# Patient Record
Sex: Female | Born: 1940 | Race: White | Hispanic: No | Marital: Married | Smoking: Former smoker
Health system: Southern US, Community
[De-identification: ages and names within clinical notes are randomized; demographics above are authoritative.]

## PROBLEM LIST (undated history)

## (undated) DIAGNOSIS — F32A Depression, unspecified: Secondary | ICD-10-CM

## (undated) DIAGNOSIS — E785 Hyperlipidemia, unspecified: Secondary | ICD-10-CM

## (undated) DIAGNOSIS — I456 Pre-excitation syndrome: Secondary | ICD-10-CM

## (undated) DIAGNOSIS — R0989 Other specified symptoms and signs involving the circulatory and respiratory systems: Secondary | ICD-10-CM

## (undated) DIAGNOSIS — C50919 Malignant neoplasm of unspecified site of unspecified female breast: Secondary | ICD-10-CM

## (undated) DIAGNOSIS — I2510 Atherosclerotic heart disease of native coronary artery without angina pectoris: Secondary | ICD-10-CM

## (undated) DIAGNOSIS — F329 Major depressive disorder, single episode, unspecified: Secondary | ICD-10-CM

## (undated) DIAGNOSIS — I7300 Raynaud's syndrome without gangrene: Secondary | ICD-10-CM

## (undated) DIAGNOSIS — I340 Nonrheumatic mitral (valve) insufficiency: Secondary | ICD-10-CM

## (undated) DIAGNOSIS — K219 Gastro-esophageal reflux disease without esophagitis: Secondary | ICD-10-CM

## (undated) DIAGNOSIS — Z923 Personal history of irradiation: Secondary | ICD-10-CM

## (undated) DIAGNOSIS — M109 Gout, unspecified: Secondary | ICD-10-CM

## (undated) DIAGNOSIS — Z972 Presence of dental prosthetic device (complete) (partial): Secondary | ICD-10-CM

## (undated) DIAGNOSIS — G893 Neoplasm related pain (acute) (chronic): Secondary | ICD-10-CM

## (undated) DIAGNOSIS — C7951 Secondary malignant neoplasm of bone: Secondary | ICD-10-CM

## (undated) DIAGNOSIS — I4891 Unspecified atrial fibrillation: Secondary | ICD-10-CM

## (undated) HISTORY — PX: ABDOMINAL HYSTERECTOMY: SHX81

## (undated) HISTORY — PX: BREAST LUMPECTOMY: SHX2

## (undated) HISTORY — DX: Neoplasm related pain (acute) (chronic): G89.3

## (undated) HISTORY — DX: Secondary malignant neoplasm of bone: C79.51

## (undated) HISTORY — PX: CLOSED REDUCTION CLAVICLE FRACTURE: SUR253

---

## 1978-04-12 HISTORY — PX: BREAST EXCISIONAL BIOPSY: SUR124

## 2000-04-12 DIAGNOSIS — C50919 Malignant neoplasm of unspecified site of unspecified female breast: Secondary | ICD-10-CM

## 2000-04-12 HISTORY — DX: Malignant neoplasm of unspecified site of unspecified female breast: C50.919

## 2000-04-12 HISTORY — PX: BREAST EXCISIONAL BIOPSY: SUR124

## 2004-02-27 ENCOUNTER — Ambulatory Visit: Payer: Self-pay | Admitting: Oncology

## 2004-06-29 ENCOUNTER — Ambulatory Visit: Payer: Self-pay | Admitting: Internal Medicine

## 2004-09-17 ENCOUNTER — Ambulatory Visit: Payer: Self-pay | Admitting: Oncology

## 2005-03-18 ENCOUNTER — Ambulatory Visit: Payer: Self-pay | Admitting: Oncology

## 2005-03-24 ENCOUNTER — Ambulatory Visit: Payer: Self-pay | Admitting: Unknown Physician Specialty

## 2005-08-03 ENCOUNTER — Ambulatory Visit: Payer: Self-pay | Admitting: Internal Medicine

## 2005-09-14 ENCOUNTER — Ambulatory Visit: Payer: Self-pay | Admitting: Oncology

## 2005-11-25 ENCOUNTER — Ambulatory Visit: Payer: Self-pay | Admitting: Oncology

## 2005-11-26 ENCOUNTER — Ambulatory Visit: Payer: Self-pay | Admitting: Internal Medicine

## 2005-11-26 IMAGING — US ABDOMEN ULTRASOUND
1 series · 17 of 25 positions shown · non-contrast
Comparison: none

REASON FOR EXAM: elevated LFT's
COMMENTS:

[Series 1: abdomen ultrasound · 17 of 57 slices shown]
[im 1/57]
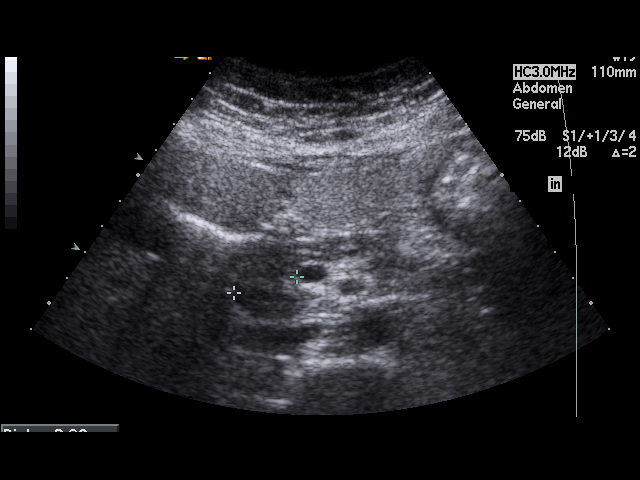
[im 5/57]
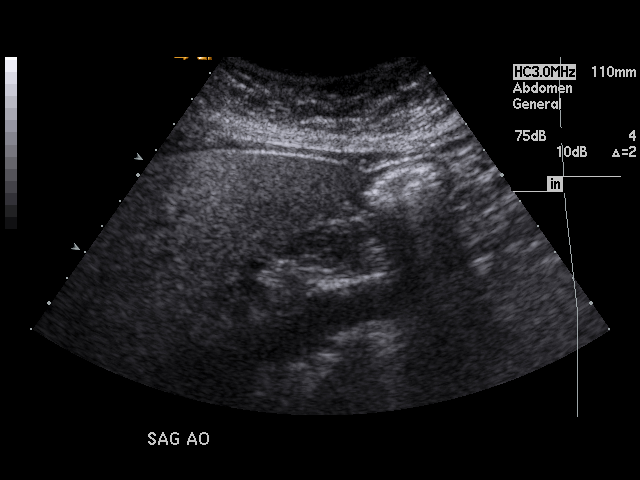
[im 8/57]
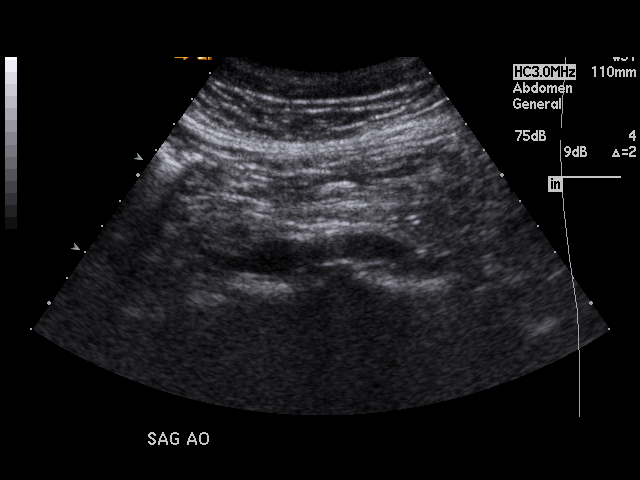
[im 12/57]
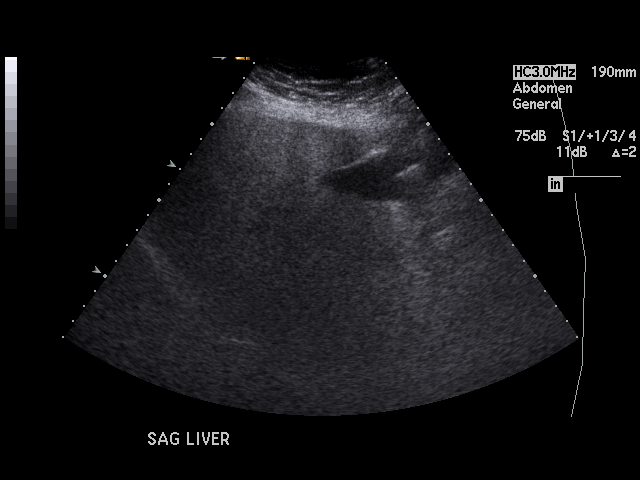
[im 15/57]
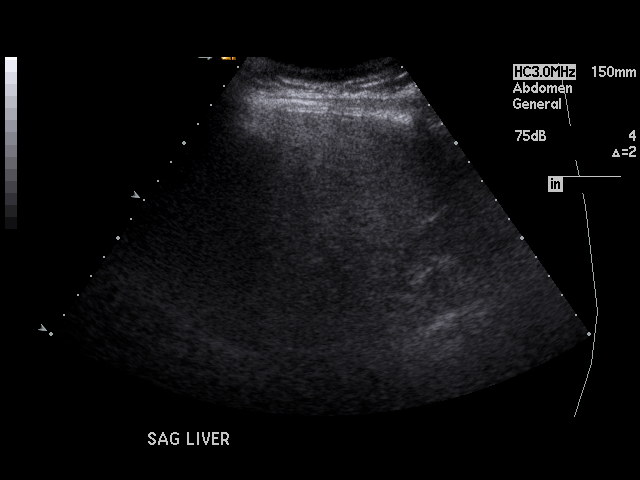
[im 19/57]
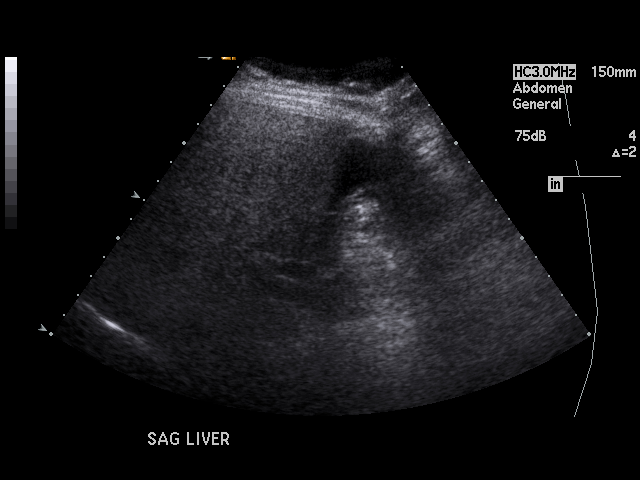
[im 22/57]
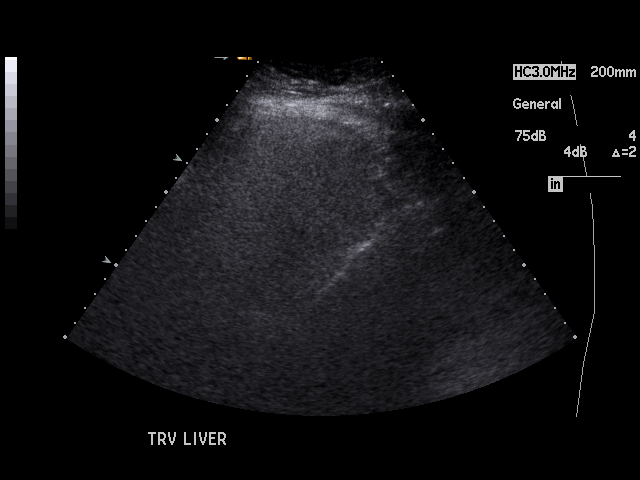
[im 26/57]
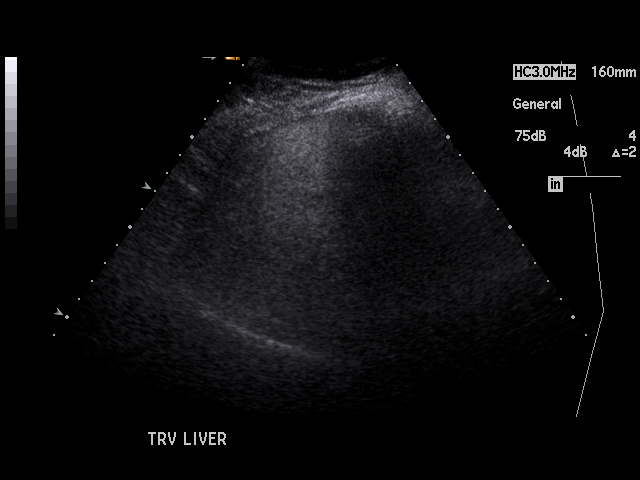
[im 29/57]
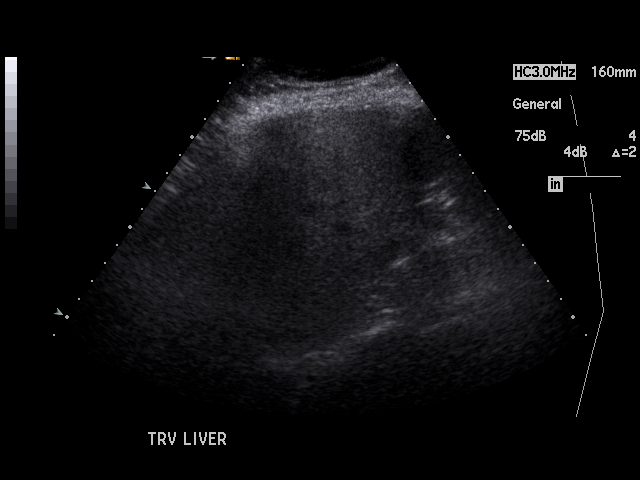
[im 31/57]
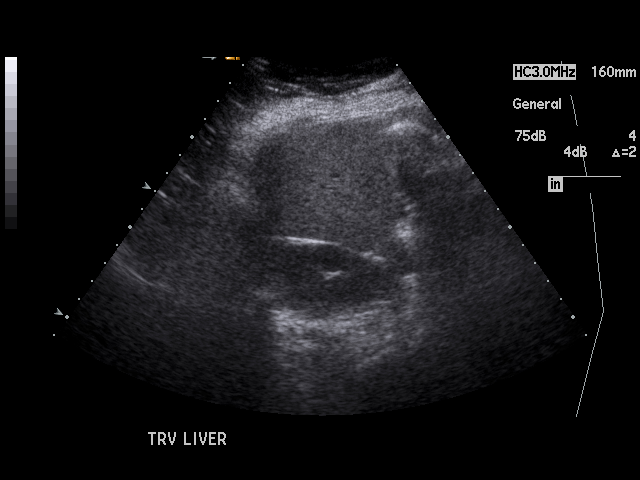
[im 36/57]
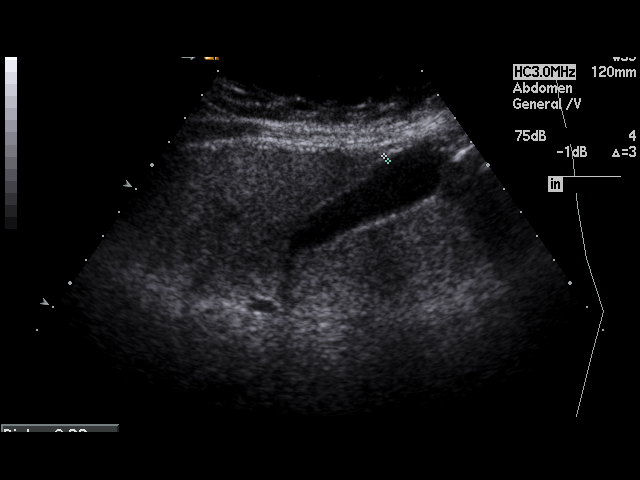
[im 38/57]
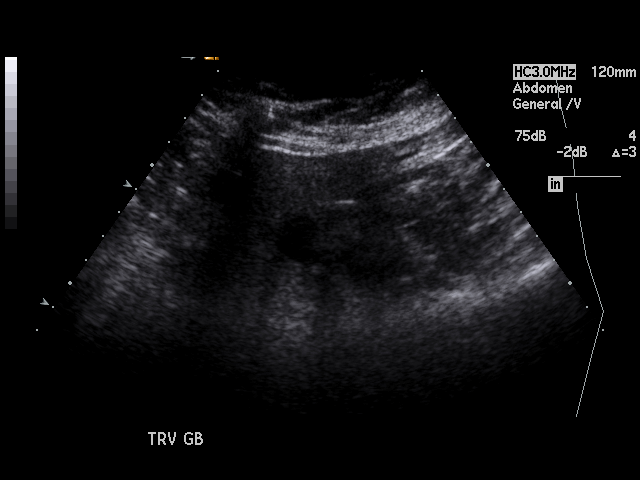
[im 43/57]
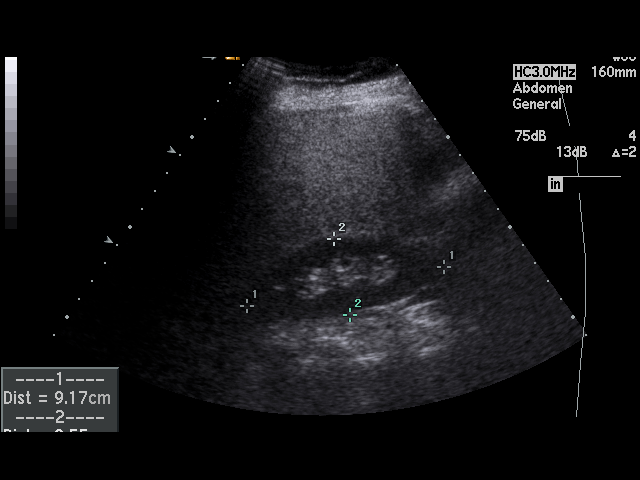
[im 45/57]
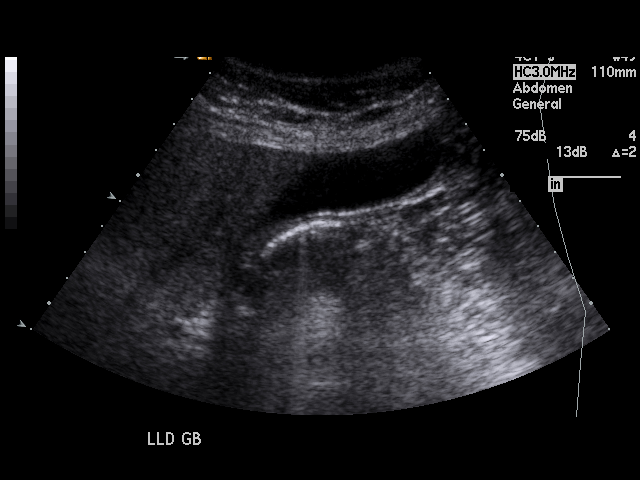
[im 50/57]
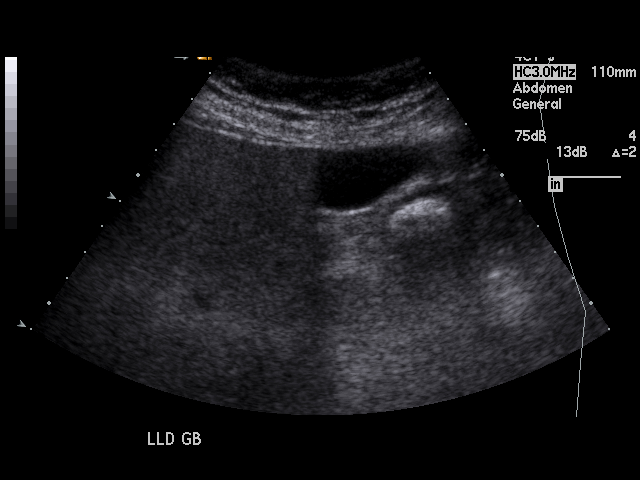
[im 52/57]
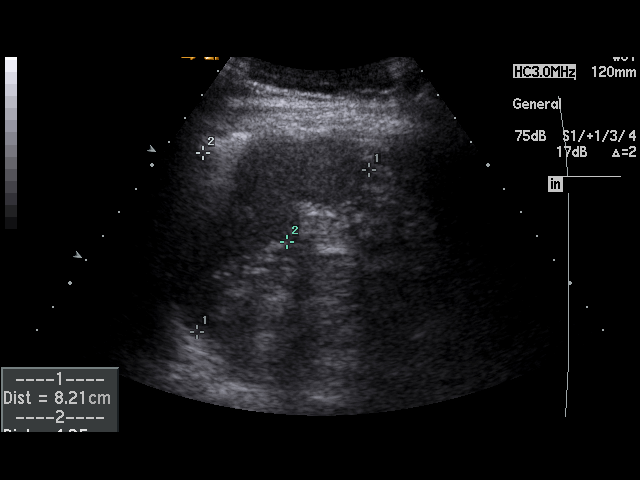
[im 57/57]
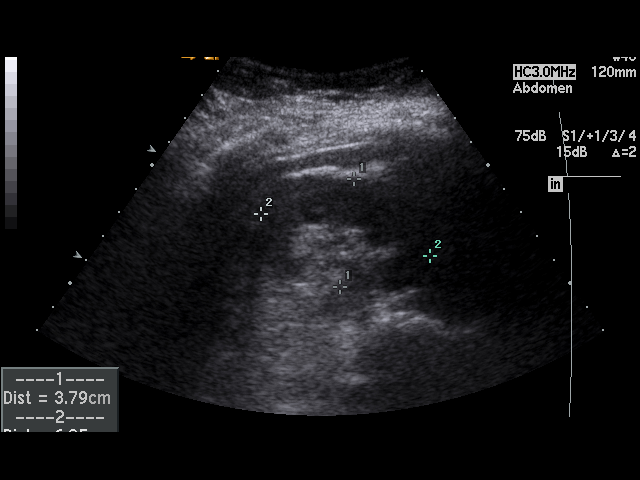

[17 of 25 positions shown; findings below may reference images not displayed]

PROCEDURE:     US  - US ABDOMEN GENERAL SURVEY  - [DATE] [DATE]

RESULT:     The liver is dense, suspicious for fatty infiltration. No focal
hepatic mass lesions are seen. The spleen is normal in size.  The pancreas
is visualized and shows no significant abnormalities. The abdominal aorta is
normal in appearance. No gallstones are seen. There is no thickening of the
gallbladder wall. The common bile duct measures 4.6 mm in diameter, which is
within normal limits. The kidneys show no hydronephrosis. There is no
ascites.
IMPRESSION: 1)Possible fatty infiltration of the liver.

2)Otherwise normal study.

## 2006-04-18 ENCOUNTER — Ambulatory Visit: Payer: Self-pay | Admitting: Oncology

## 2006-09-23 ENCOUNTER — Ambulatory Visit: Payer: Self-pay | Admitting: Internal Medicine

## 2006-10-11 ENCOUNTER — Ambulatory Visit: Payer: Self-pay | Admitting: Oncology

## 2006-10-17 ENCOUNTER — Ambulatory Visit: Payer: Self-pay | Admitting: Oncology

## 2006-11-11 ENCOUNTER — Ambulatory Visit: Payer: Self-pay | Admitting: Oncology

## 2007-04-13 ENCOUNTER — Ambulatory Visit: Payer: Self-pay | Admitting: Oncology

## 2007-04-26 ENCOUNTER — Ambulatory Visit: Payer: Self-pay | Admitting: Oncology

## 2007-05-14 ENCOUNTER — Ambulatory Visit: Payer: Self-pay | Admitting: Oncology

## 2007-10-02 ENCOUNTER — Ambulatory Visit: Payer: Self-pay | Admitting: Internal Medicine

## 2007-10-11 ENCOUNTER — Ambulatory Visit: Payer: Self-pay | Admitting: Oncology

## 2007-11-03 ENCOUNTER — Ambulatory Visit: Payer: Self-pay | Admitting: Oncology

## 2007-11-11 ENCOUNTER — Ambulatory Visit: Payer: Self-pay | Admitting: Oncology

## 2008-10-02 ENCOUNTER — Ambulatory Visit: Payer: Self-pay | Admitting: Internal Medicine

## 2008-10-10 ENCOUNTER — Ambulatory Visit: Payer: Self-pay | Admitting: Oncology

## 2008-10-31 ENCOUNTER — Ambulatory Visit: Payer: Self-pay | Admitting: Oncology

## 2008-11-10 ENCOUNTER — Ambulatory Visit: Payer: Self-pay | Admitting: Oncology

## 2009-10-08 ENCOUNTER — Ambulatory Visit: Payer: Self-pay | Admitting: Internal Medicine

## 2009-10-10 ENCOUNTER — Ambulatory Visit: Payer: Self-pay | Admitting: Internal Medicine

## 2009-10-10 ENCOUNTER — Ambulatory Visit: Payer: Self-pay | Admitting: Oncology

## 2009-10-27 ENCOUNTER — Ambulatory Visit: Payer: Self-pay | Admitting: Oncology

## 2009-11-10 ENCOUNTER — Ambulatory Visit: Payer: Self-pay | Admitting: Oncology

## 2009-12-08 ENCOUNTER — Ambulatory Visit: Payer: Self-pay | Admitting: Ophthalmology

## 2009-12-16 ENCOUNTER — Ambulatory Visit: Payer: Self-pay | Admitting: Ophthalmology

## 2010-01-13 ENCOUNTER — Ambulatory Visit: Payer: Self-pay | Admitting: Ophthalmology

## 2010-02-03 ENCOUNTER — Ambulatory Visit: Payer: Self-pay | Admitting: Ophthalmology

## 2010-10-13 ENCOUNTER — Ambulatory Visit: Payer: Self-pay | Admitting: Internal Medicine

## 2010-10-13 IMAGING — MG MAM DGTL DIAGNOSTIC MAMMO W/CAD
1 series · 6 of 6 positions shown · non-contrast
Comparison: none

REASON FOR EXAM: hx ca  yrly
COMMENTS:

[Series 1367: R CC · right · 6 of 6 slices shown]
[im 1/6]
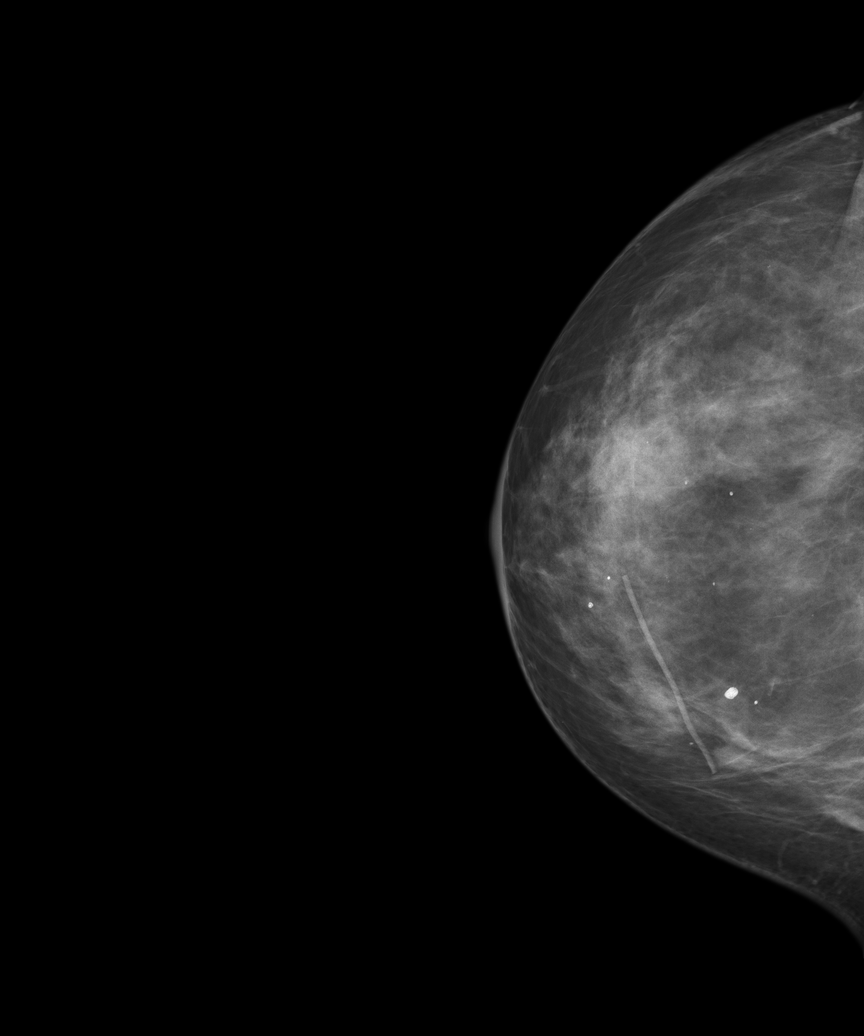
[im 2/6]
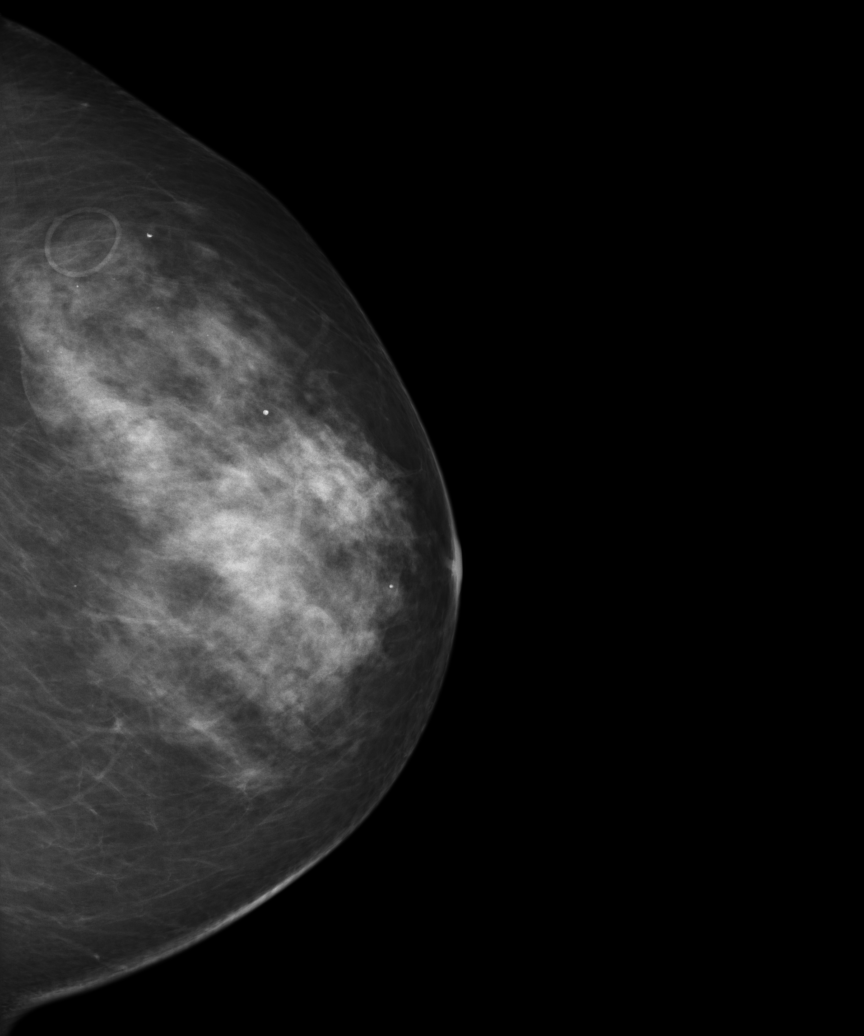
[im 3/6]
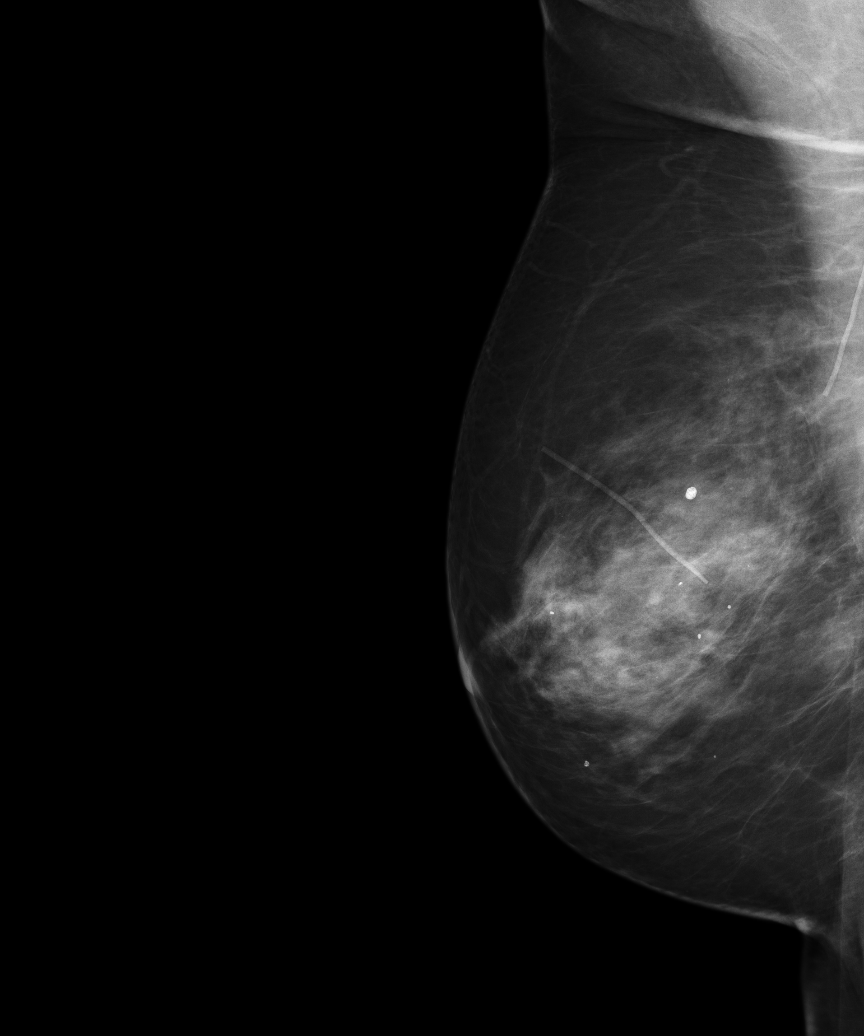
[im 4/6]
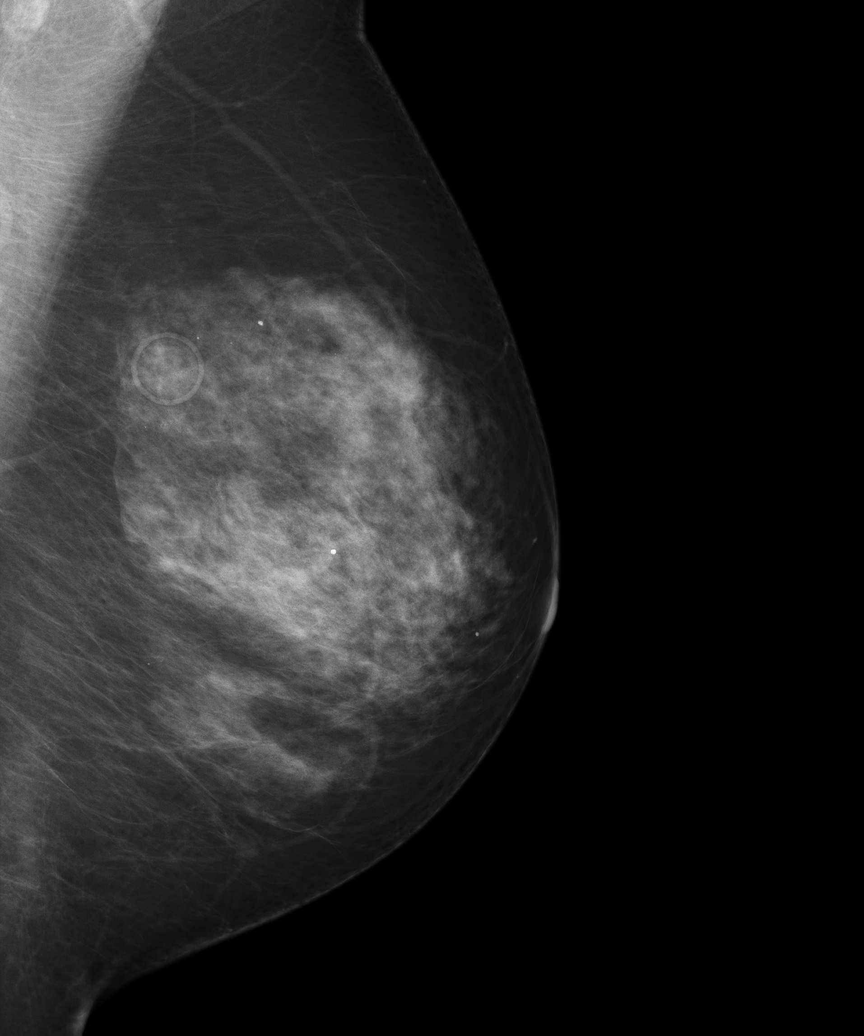
[im 5/6]
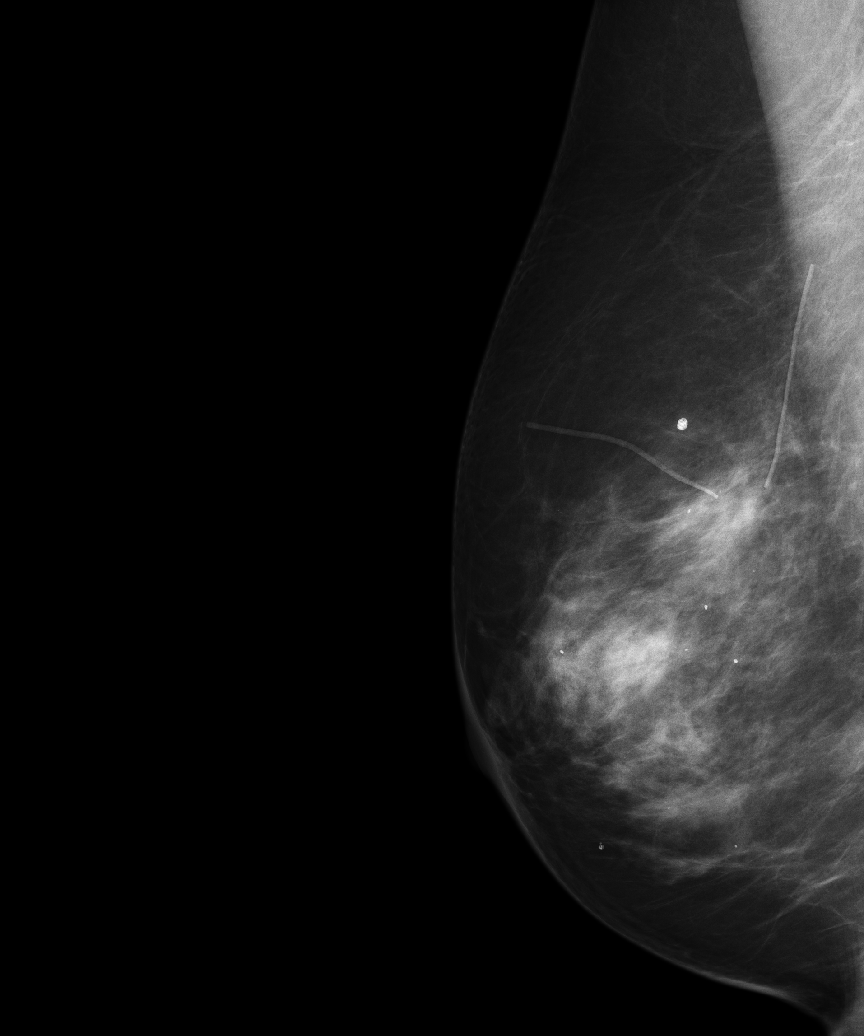
[im 6/6]
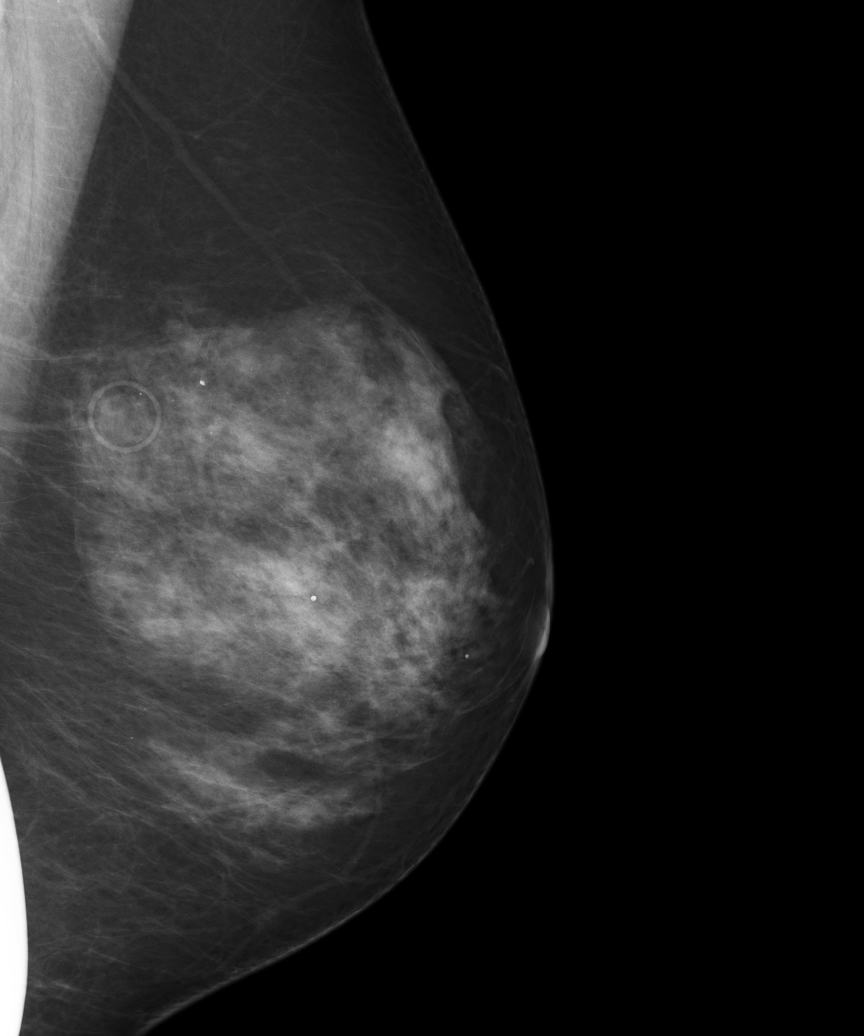

[6 of 6 positions shown; findings below may reference images not displayed]

PROCEDURE:     MAM - MAM DGTL DIAGNOSTIC MAMMO W/CAD  - [DATE]  [DATE]

RESULT:     The patient has a history of right breast lumpectomy with a
diagnosis of cancer in [KD] followed by radiation therapy. Comparison is
made to the previous digital images of [DATE], as well as [DATE]. The
breasts exhibit a dense heterogeneous parenchymal pattern with scattered
coarse and stable, benign calcifications present. A skin lesion is marked
laterally in the posterior left breast. There does not appear to be evidence
of an area of developing parenchymal density or dominant mass. A surgical
scar marker is seen in the upper medial aspect of the right breast midway
between the nipple and pectoralis muscle and in the upper outer posterior
right breast near the axillary region.
IMPRESSION: 1.Stable, benign appearing bilateral mammogram.

BI-RADS: Category 2 - Benign Findings

RECOMMENDATIONS:

1.     Please continue to encourage yearly mammographic follow-up.

A NEGATIVE MAMMOGRAM REPORT DOES NOT PRECLUDE BIOPSY OR OTHER EVALUATION OF
A CLINICALLY PALPABLE OR OTHERWISE SUSPICIOUS MASS OR LESION. BREAST CANCER
MAY NOT BE DETECTED BY MAMMOGRAPHY IN UP TO 10% OF CASES.

## 2010-12-22 ENCOUNTER — Ambulatory Visit: Payer: Self-pay | Admitting: Oncology

## 2011-01-11 ENCOUNTER — Ambulatory Visit: Payer: Self-pay | Admitting: Oncology

## 2011-11-04 ENCOUNTER — Ambulatory Visit: Payer: Self-pay | Admitting: Internal Medicine

## 2011-11-04 IMAGING — MG MM CAD DIAGNOSTIC MAMMO
1 series · 6 of 6 positions shown · non-contrast
Comparison: none

REASON FOR EXAM: HX BRST CA YRLY
COMMENTS:

PROCEDURE:     MAM - MAM DGTL DIAGNOSTIC MAMMO W/CAD  - [DATE]  [DATE]
RESULT:

[R CC · right · 6 of 6 slices shown]
[im 1/6]
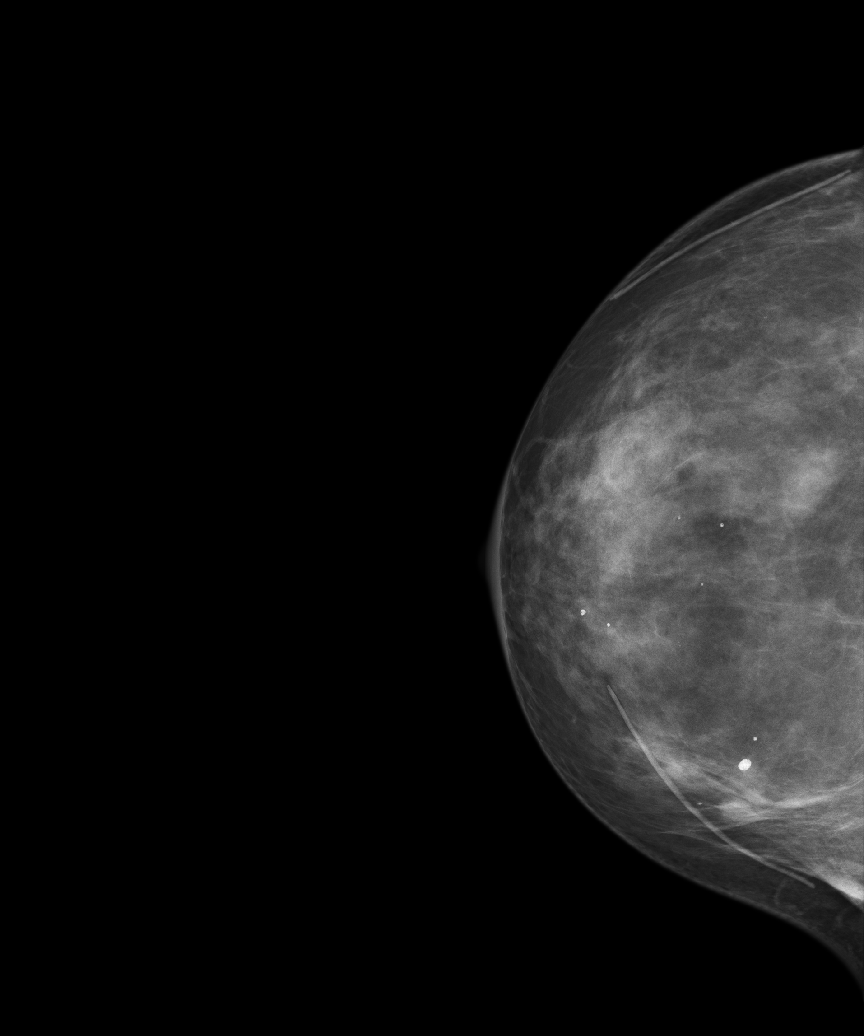
[im 2/6]
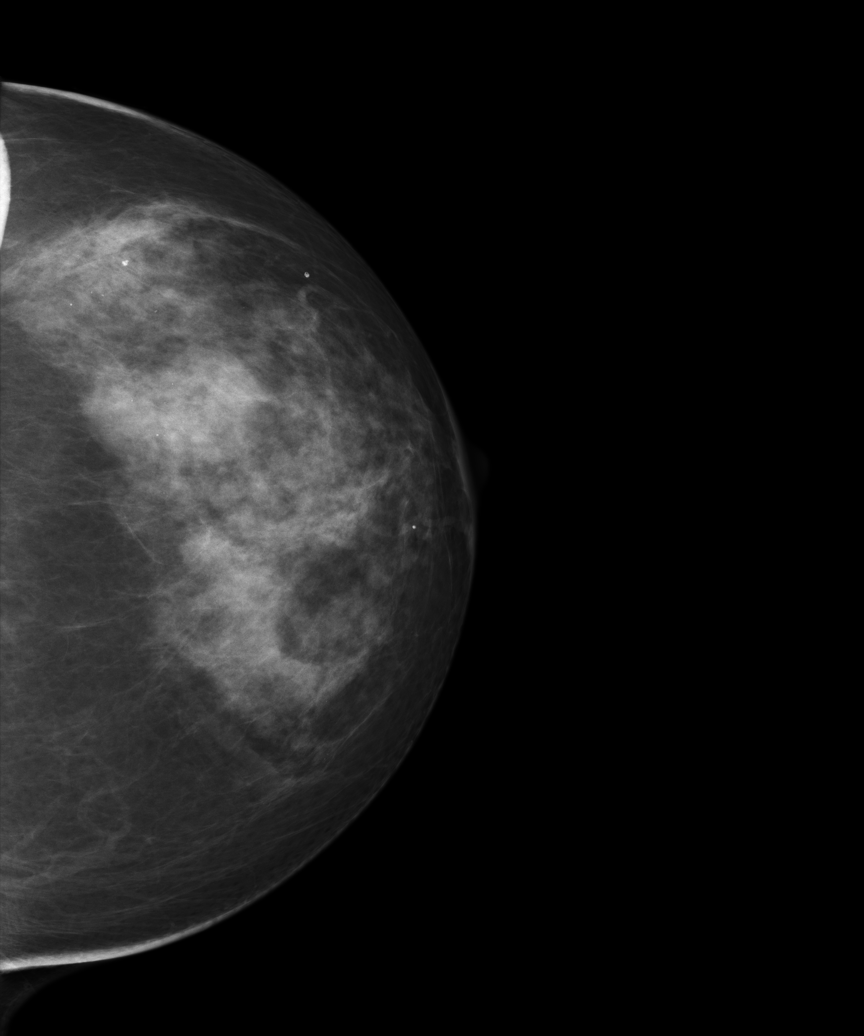
[im 3/6]
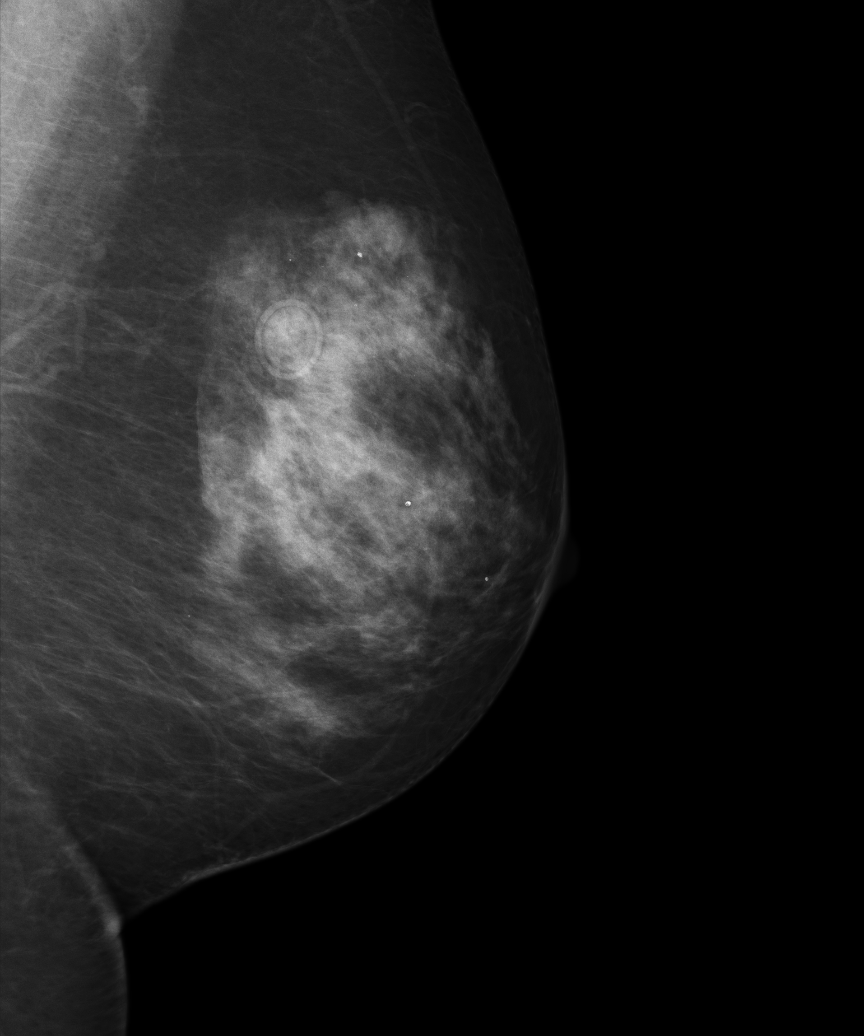
[im 4/6]
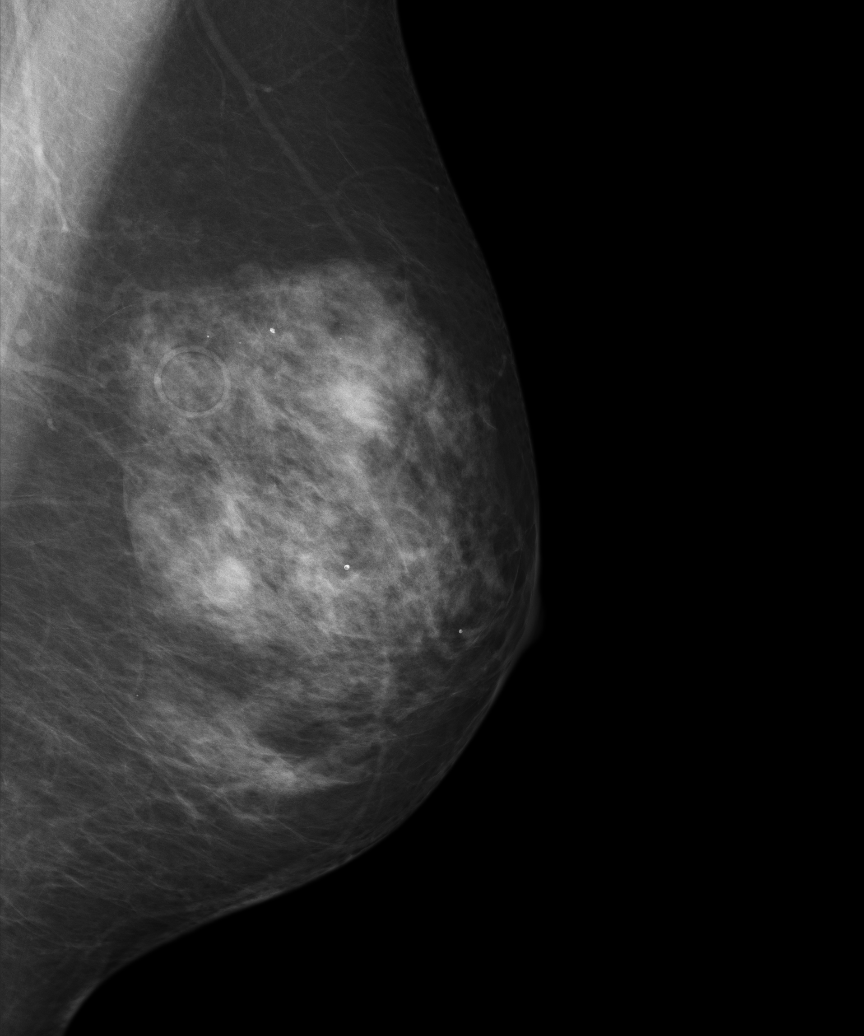
[im 5/6]
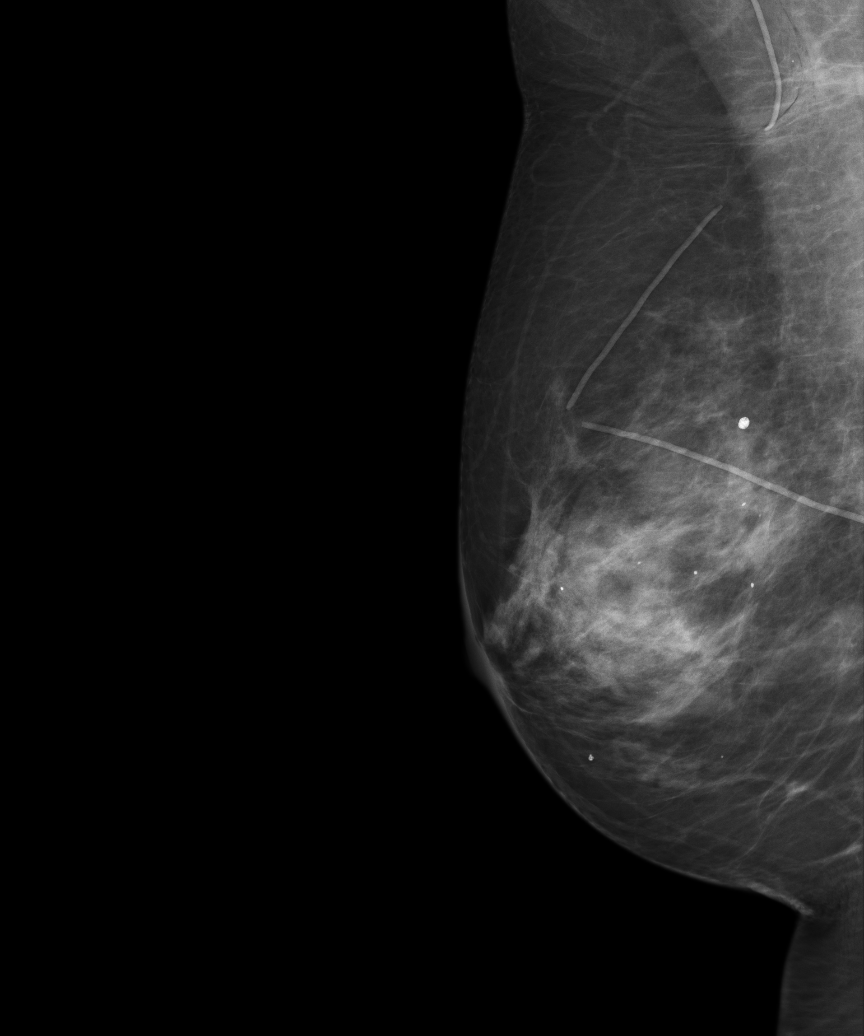
[im 6/6]
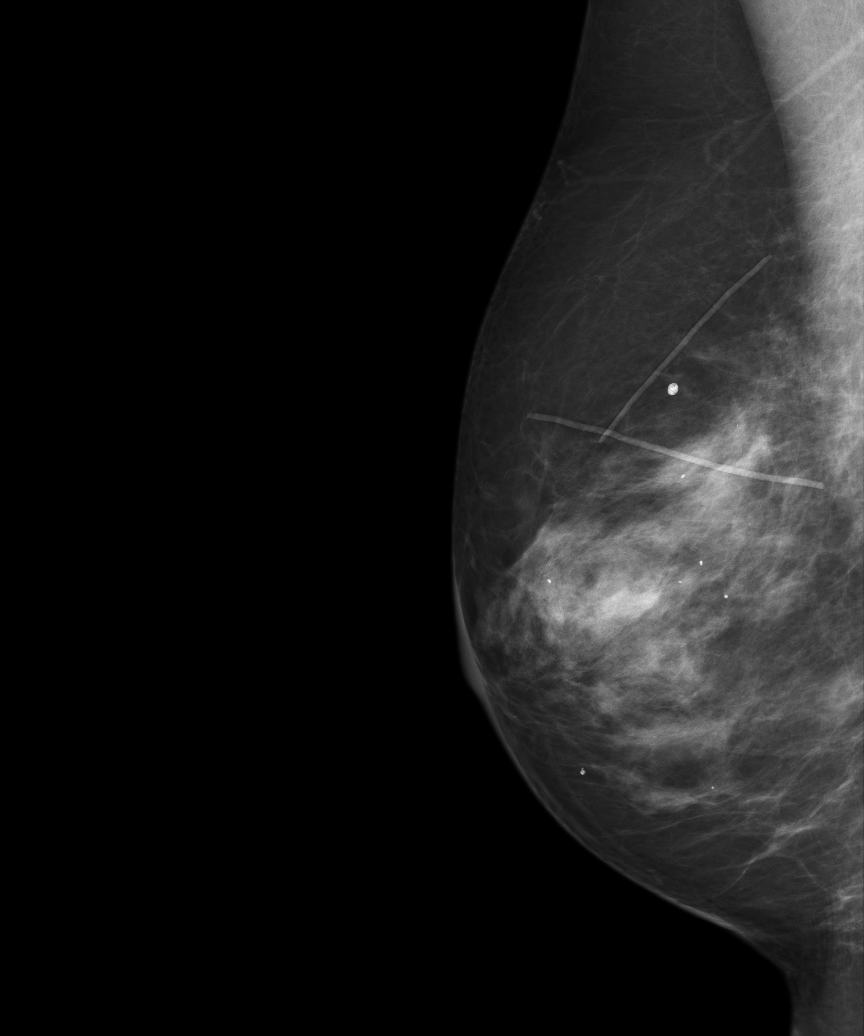

[6 of 6 positions shown; findings below may reference images not displayed]

FINDINGS: There is a family history of breast cancer in the patient's two
sisters. The patient has a history of previous right breast cancer with
lumpectomy and radiation therapy. The patient was diagnosed in [KP].

Comparison is made to the previous digital mammographic images dated
[DATE] as well as [DATE].

Scar markers are seen medially and laterally in the right breast both
projecting above the level of the nipple with an axillary scar marker on the
right as well. There is a moderate to dense parenchymal pattern bilaterally
without a developing parenchymal density or dominant mass. Scattered, benign
appearing macrocalcifications and microcalcifications are present and appear
stable.
IMPRESSION: Stable, benign appearing bilateral mammogram.

BI-RADS: Category 2 - Benign Finding

RECOMMENDATION:  Please continue to encourage annual mammographic follow-up
and monthly breast self exam.

Thank you for this opportunity to contribute to the care of your patient.

A NEGATIVE MAMMOGRAM REPORT DOES NOT PRECLUDE BIOPSY OR OTHER EVALUATION OF
A CLINICALLY PALPABLE OR OTHERWISE SUSPICIOUS MASS OR LESION. BREAST CANCER
MAY NOT BE DETECTED BY MAMMOGRAPHY IN UP TO 10% OF CASES.

## 2012-09-24 ENCOUNTER — Emergency Department: Payer: Self-pay | Admitting: Emergency Medicine

## 2012-09-24 IMAGING — CR DG SHOULDER 3+V*L*
1 series · 4 of 4 positions shown · non-contrast
Comparison: none

REASON FOR EXAM: fall
COMMENTS:

PROCEDURE:     DXR - DXR SHOULDER LEFT COMPLETE  - [DATE]  [DATE]
RESULT:

[Series 1: external rotate · 0.17mm/px · 4 of 4 slices shown]
[im 1/4]
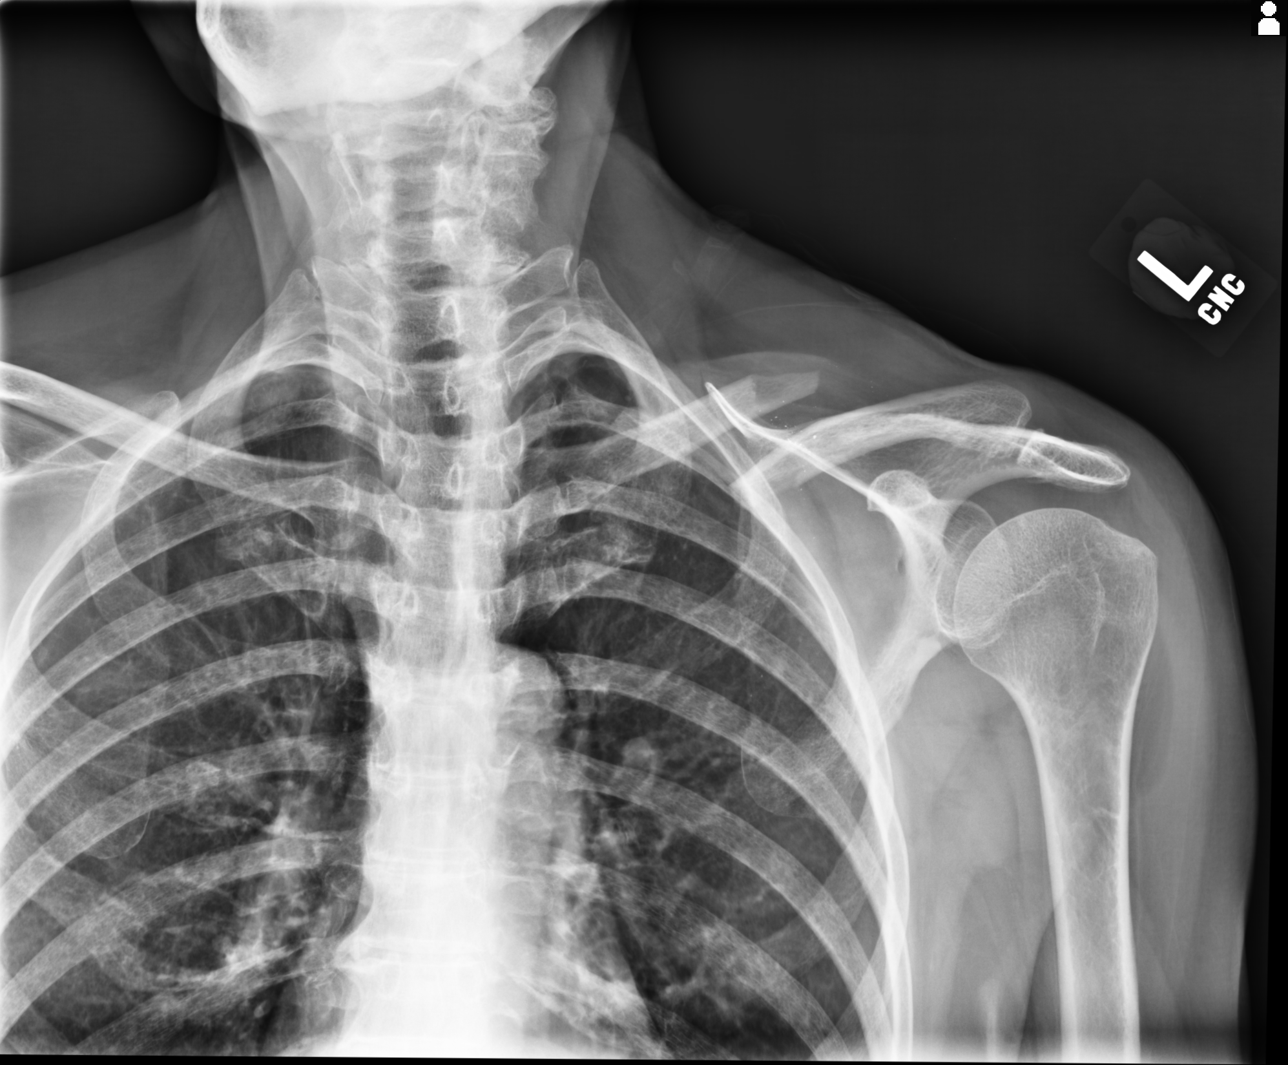
[im 2/4]
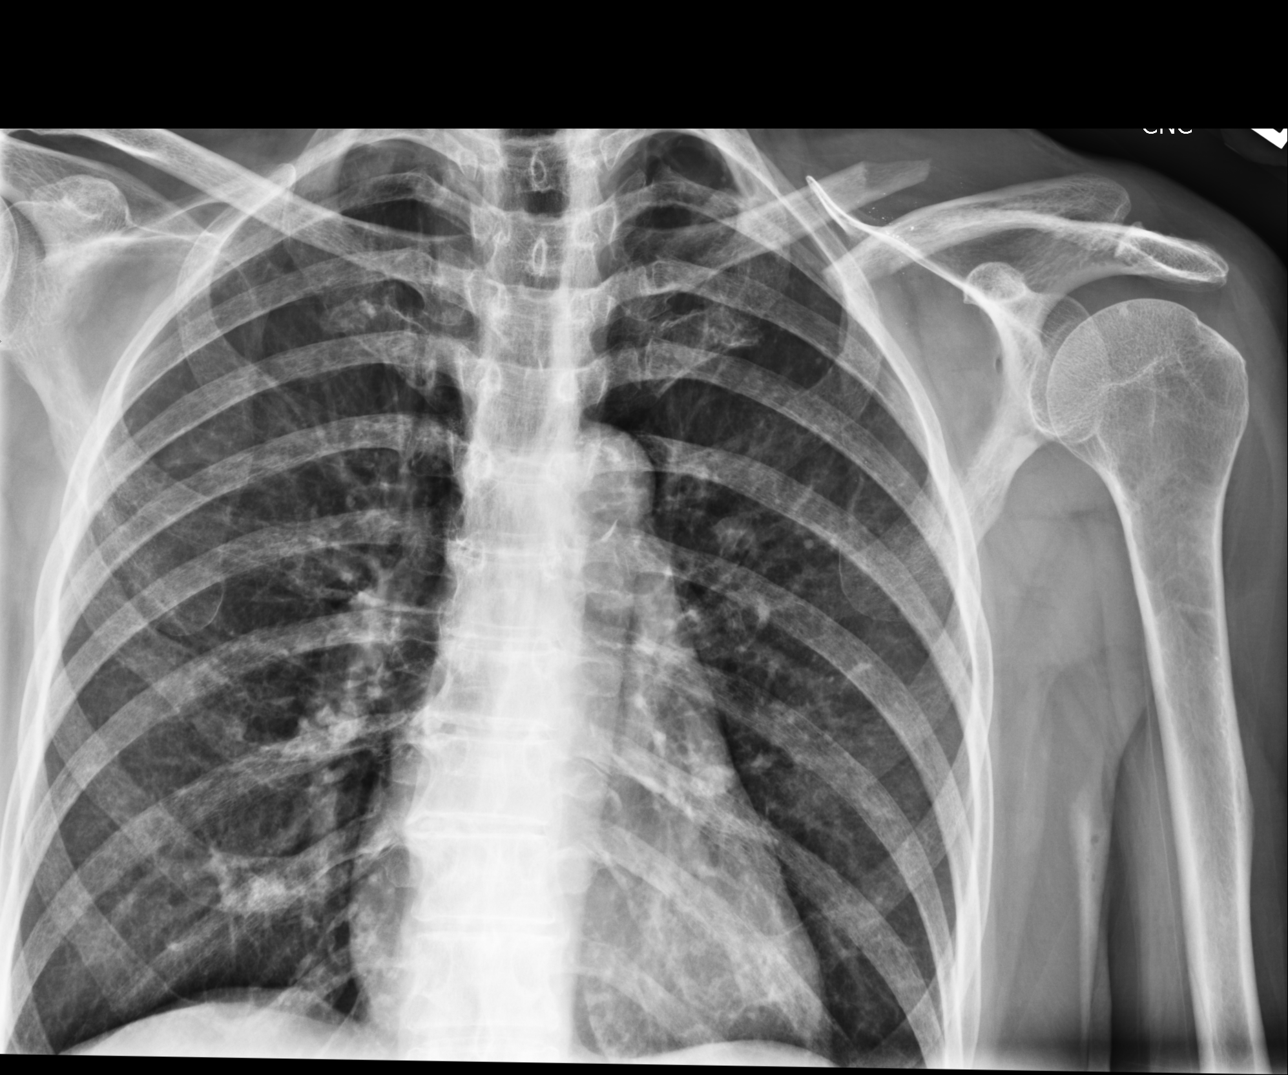
[im 3/4]
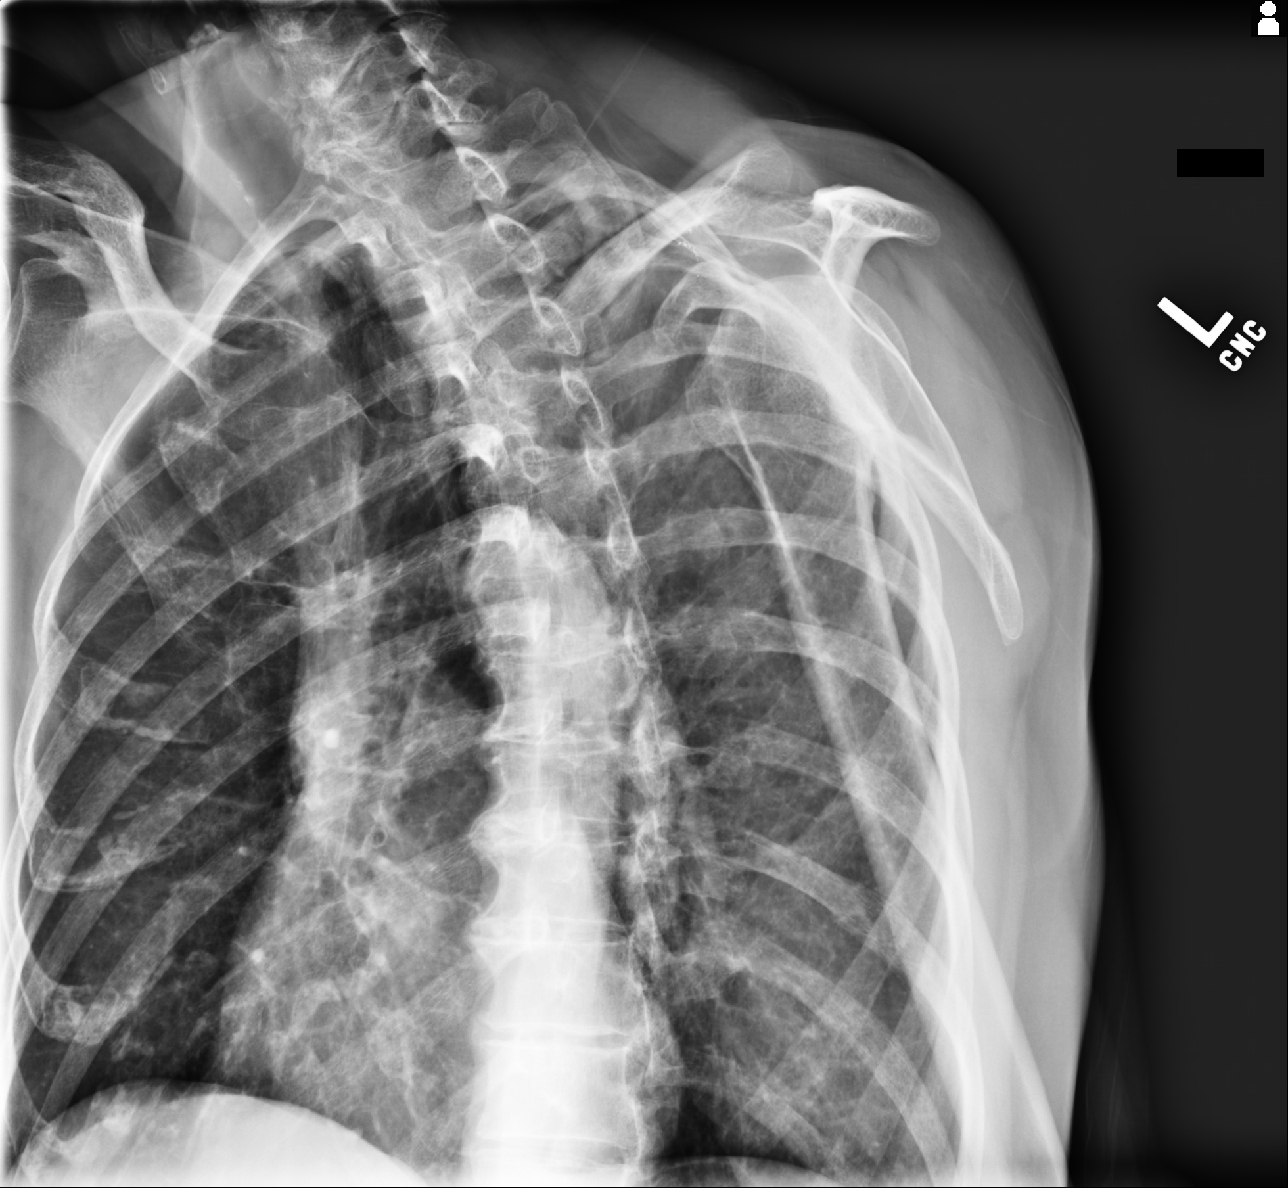
[im 4/4]
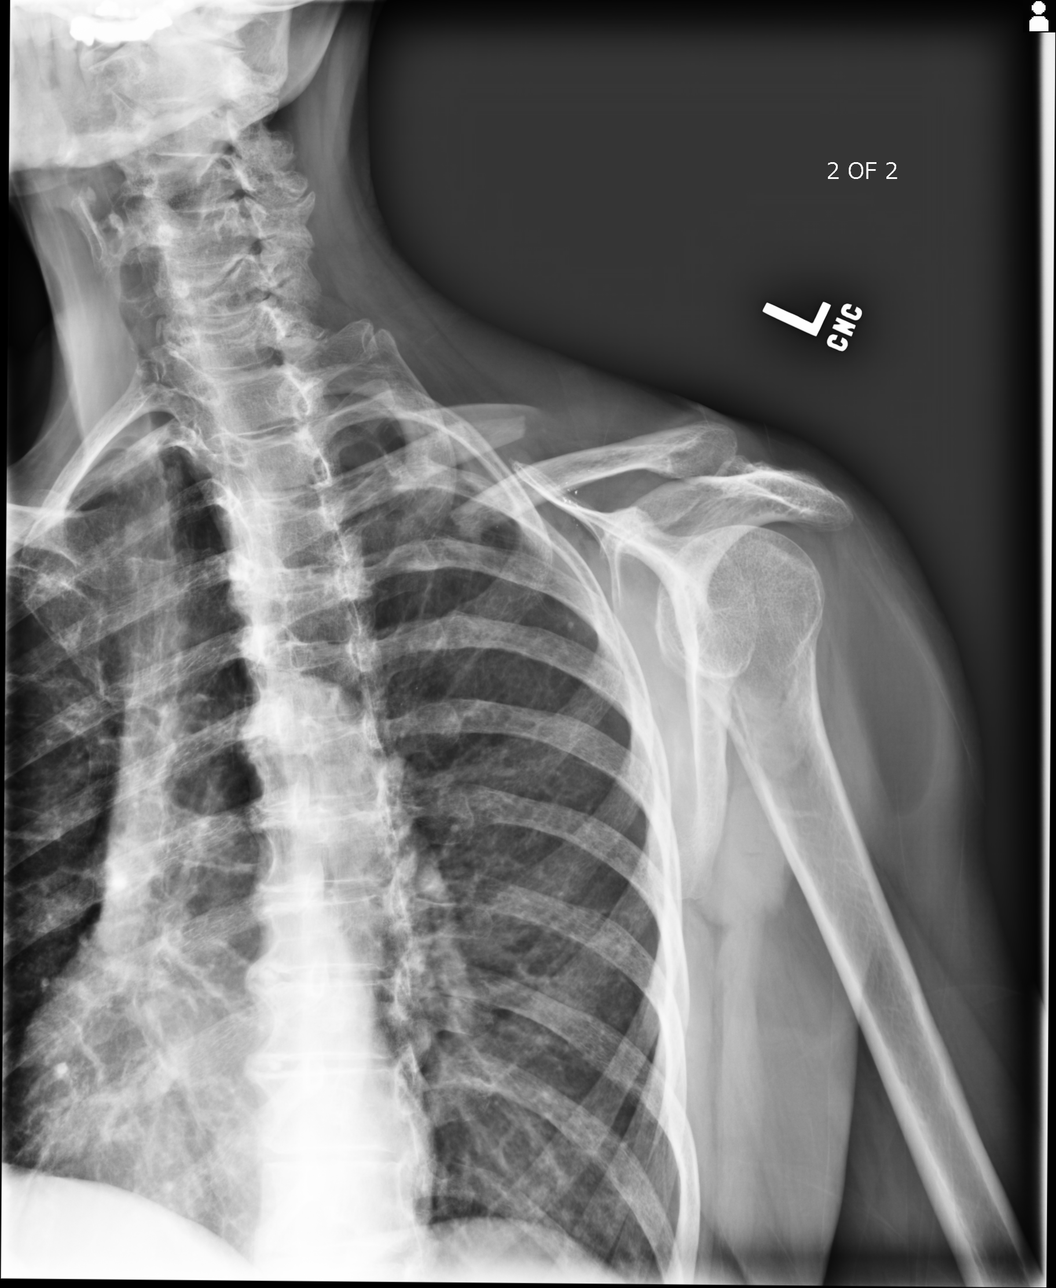

[4 of 4 positions shown; findings below may reference images not displayed]

FINDINGS: A midclavicular fracture is identified. The distal fracture
fragment demonstrates inferior displacement and angulation. The lungs are
grossly unremarkable. There is no evidence of dislocation.
IMPRESSION: Midclavicular fracture.

## 2012-10-18 ENCOUNTER — Inpatient Hospital Stay: Payer: Self-pay

## 2012-10-18 LAB — COMPREHENSIVE METABOLIC PANEL
Albumin: 3.8 g/dL (ref 3.4–5.0)
Alkaline Phosphatase: 96 U/L (ref 50–136)
Anion Gap: 12 (ref 7–16)
BUN: 21 mg/dL — ABNORMAL HIGH (ref 7–18)
Calcium, Total: 9 mg/dL (ref 8.5–10.1)
Chloride: 103 mmol/L (ref 98–107)
Co2: 22 mmol/L (ref 21–32)
Creatinine: 1.2 mg/dL (ref 0.60–1.30)
EGFR (African American): 53 — ABNORMAL LOW
Glucose: 140 mg/dL — ABNORMAL HIGH (ref 65–99)
Osmolality: 279 (ref 275–301)
SGOT(AST): 82 U/L — ABNORMAL HIGH (ref 15–37)
SGPT (ALT): 37 U/L (ref 12–78)
Sodium: 137 mmol/L (ref 136–145)
Total Protein: 7.6 g/dL (ref 6.4–8.2)

## 2012-10-18 LAB — CBC
HGB: 11.8 g/dL — ABNORMAL LOW (ref 12.0–16.0)
MCH: 31.7 pg (ref 26.0–34.0)
MCHC: 33.2 g/dL (ref 32.0–36.0)
MCV: 96 fL (ref 80–100)
RBC: 3.74 10*6/uL — ABNORMAL LOW (ref 3.80–5.20)
RDW: 14.6 % — ABNORMAL HIGH (ref 11.5–14.5)

## 2012-10-18 LAB — TROPONIN I: Troponin-I: <0.02 ng/mL

## 2012-10-18 LAB — PRO B NATRIURETIC PEPTIDE: B-Type Natriuretic Peptide: 7979 pg/mL — ABNORMAL HIGH (ref 0–125)

## 2012-10-18 LAB — CK TOTAL AND CKMB (NOT AT ARMC)
CK, Total: 48 U/L (ref 21–215)
CK-MB: 2.1 ng/mL (ref 0.5–3.6)
CK-MB: 1.5 ng/mL (ref 0.5–3.6)

## 2012-10-18 LAB — MAGNESIUM: Magnesium: 2 mg/dL

## 2012-10-18 IMAGING — CR DG CHEST 1V PORT
1 series · 1 of 1 positions shown · non-contrast
Comparison: none

REASON FOR EXAM: Shortness of Breath
COMMENTS:

PROCEDURE:     DXR - DXR PORTABLE CHEST SINGLE VIEW  - [DATE]  [DATE]
RESULT:

[ap]
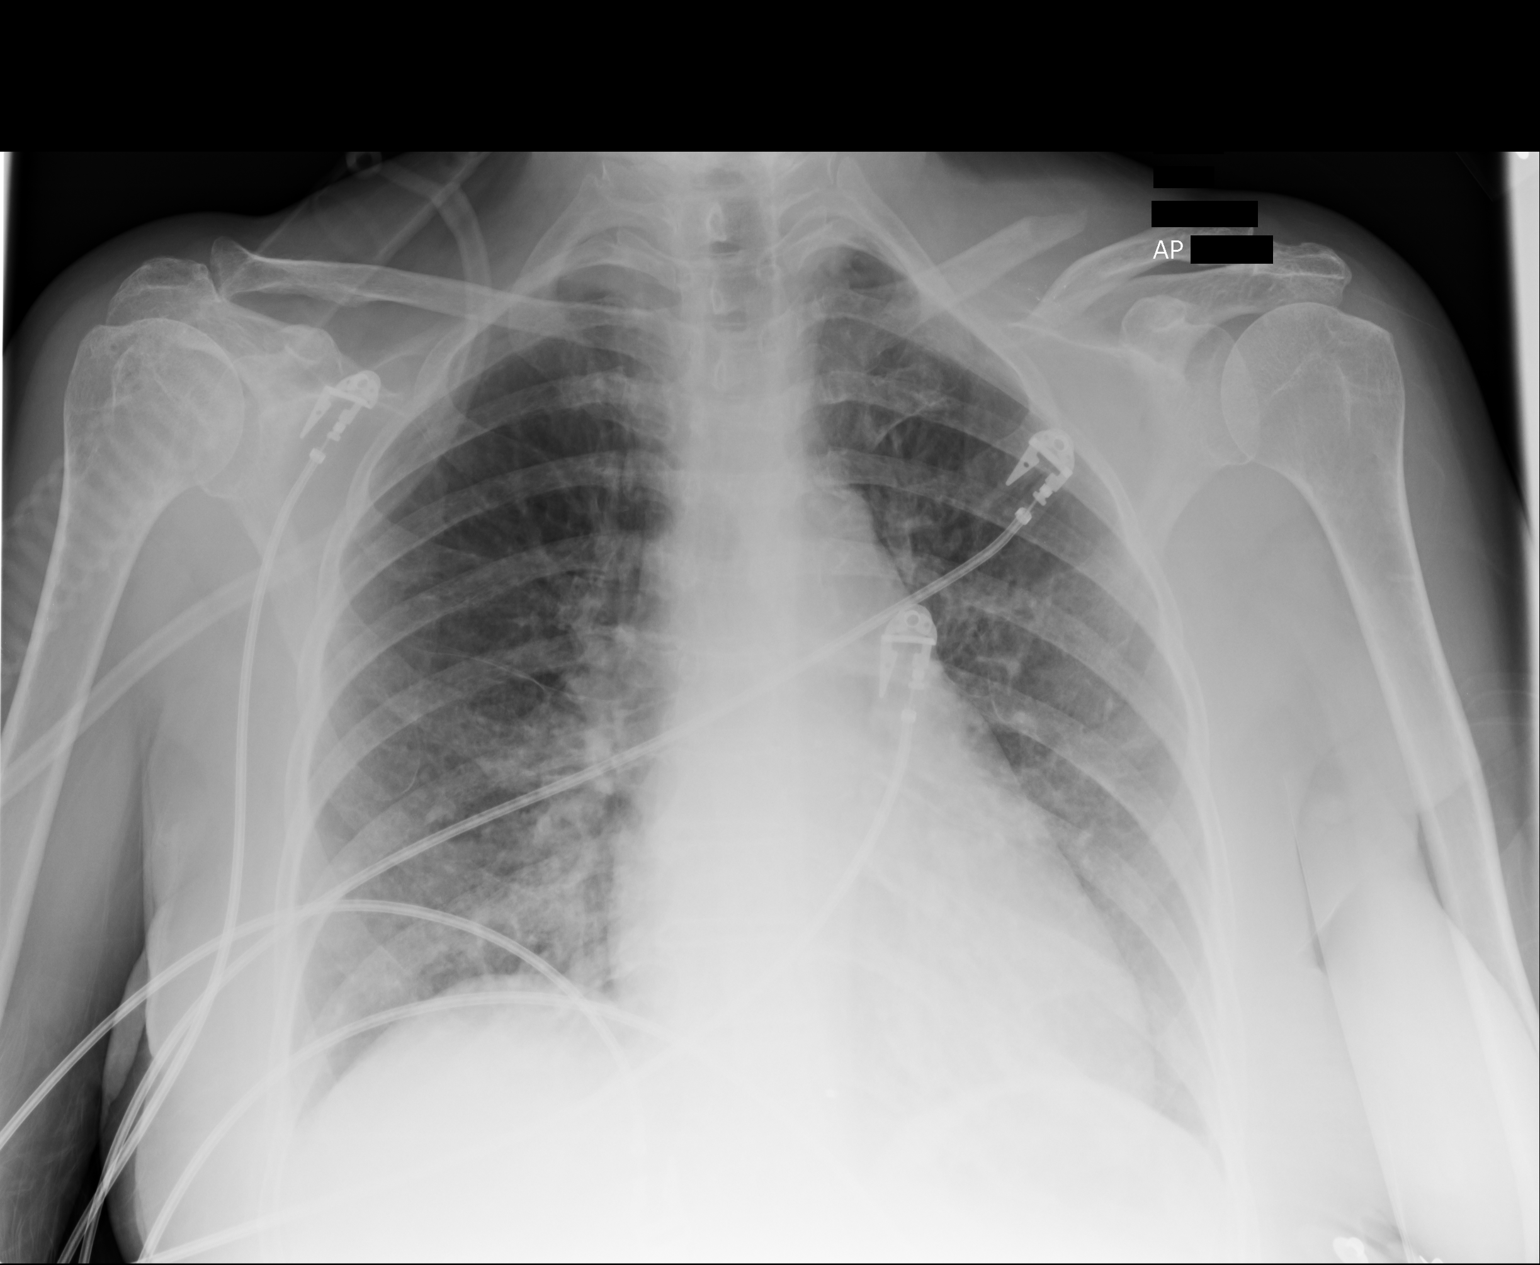

[1 of 1 positions shown; findings below may reference images not displayed]

FINDINGS: There is prominence of the interstitial markings. No focal regions
of consolidation are identified. The cardiac silhouette is moderately
enlarged. The visualized bony skeleton is unremarkable.
IMPRESSION: 1. Interstitial prominence. Differential considerations are infectious or
inflammatory infiltrate. Underlying component of noncardiogenic edema is
also a diagnostic consideration. Surveillance evaluation recommended.

## 2012-10-18 IMAGING — NM NM LUNG SCAN
2 series · 16 of 16 positions shown · non-contrast
Comparison: none

REASON FOR EXAM: dyspnea, hypoxia, tachycardia, elevated D-dimer,
requested by Dr. SUPPA
COMMENTS:

[Series 1000: lung perfusion · 1.95mm/px · 4 acquisitions, 8 frames shown]
[im 1/4]
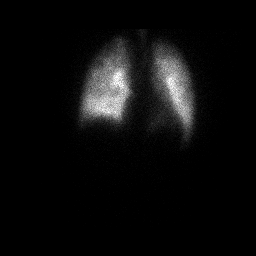
[im 1/4]
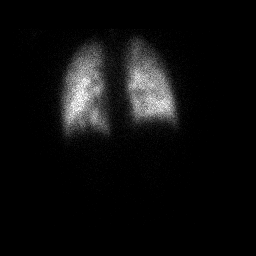
[im 2/4]
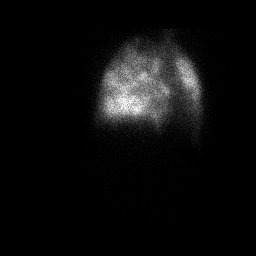
[im 2/4]
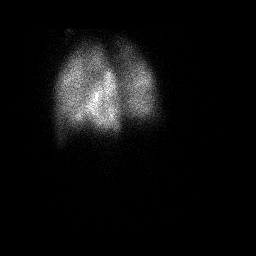
[im 3/4]
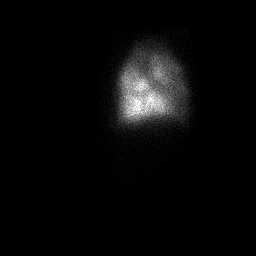
[im 3/4]
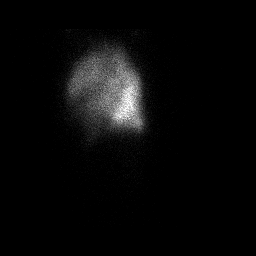
[im 4/4]
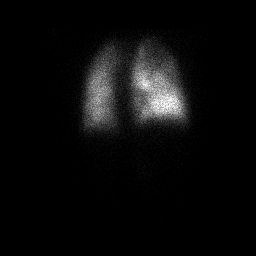
[im 4/4]
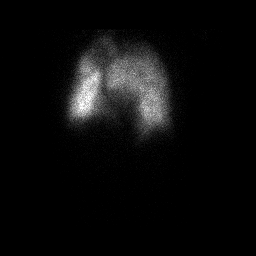

[Series 1000: lung ventilation · 3.90mm/px · 4 acquisitions, 8 frames shown]
[im 1/4]
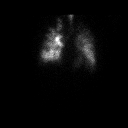
[im 1/4]
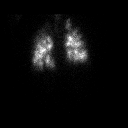
[im 2/4]
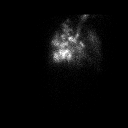
[im 2/4]
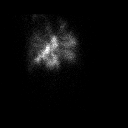
[im 3/4]
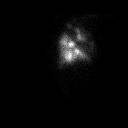
[im 3/4]
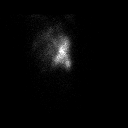
[im 4/4]
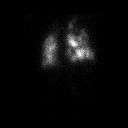
[im 4/4]
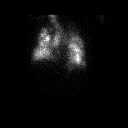

[16 of 16 positions shown; findings below may reference images not displayed]

PROCEDURE:     NM  - NM VQ LUNG SCAN  - [DATE]  [DATE] [DATE]  [DATE]

RESULT:

Procedure:  Scintigraphic imaging of the chest was obtained and standard
obliquities during the ventilation and perfusion portion of the evaluation.

Radiopharmaceutical:

Ventilation: 38.5 mCi of technetium 99m labeled DTPA via nebulizer

Perfusion:  3.91 mCi of technetium 99m labeled MAA via left forearm injection

This study was correlated with a prior chest radiograph dated [DATE].
FINDINGS: There is no evidence of pleural based, wedge shaped
ventilation/perfusion defects appreciated. A mild deficit is identified
within the anterior segment of the right upper lobe.

There are multiple areas of air trapping appreciated throughout both lungs
as well as central radiopharmaceutical clumping.
IMPRESSION: 1.     Low probability ventilation/perfusion study for pulmonary arterial
embolic disease.
2.     Findings which may represent a component of emphysematous changes.

## 2012-10-19 LAB — CBC WITH DIFFERENTIAL/PLATELET
Basophil #: 0 10*3/uL (ref 0.0–0.1)
Eosinophil #: 0 10*3/uL (ref 0.0–0.7)
Lymphocyte #: 0.8 10*3/uL — ABNORMAL LOW (ref 1.0–3.6)
Lymphocyte %: 8.9 %
MCHC: 33 g/dL (ref 32.0–36.0)
MCV: 93 fL (ref 80–100)
Monocyte #: 0.6 x10 3/mm (ref 0.2–0.9)
Monocyte %: 6.4 %
Neutrophil #: 7.2 10*3/uL — ABNORMAL HIGH (ref 1.4–6.5)
Platelet: 334 10*3/uL (ref 150–440)
RBC: 3.64 10*6/uL — ABNORMAL LOW (ref 3.80–5.20)
WBC: 8.6 10*3/uL (ref 3.6–11.0)

## 2012-10-19 LAB — BASIC METABOLIC PANEL
Calcium, Total: 9 mg/dL (ref 8.5–10.1)
Chloride: 104 mmol/L (ref 98–107)
Creatinine: 1.17 mg/dL (ref 0.60–1.30)
EGFR (Non-African Amer.): 47 — ABNORMAL LOW
Sodium: 138 mmol/L (ref 136–145)

## 2012-10-19 LAB — TROPONIN I: Troponin-I: <0.02 ng/mL

## 2012-10-19 LAB — CK TOTAL AND CKMB (NOT AT ARMC): CK-MB: 1.8 ng/mL (ref 0.5–3.6)

## 2012-10-19 IMAGING — CR DG CHEST 1V PORT
1 series · 1 of 1 positions shown · non-contrast
Comparison: none

REASON FOR EXAM: SOB
COMMENTS:

[portable]
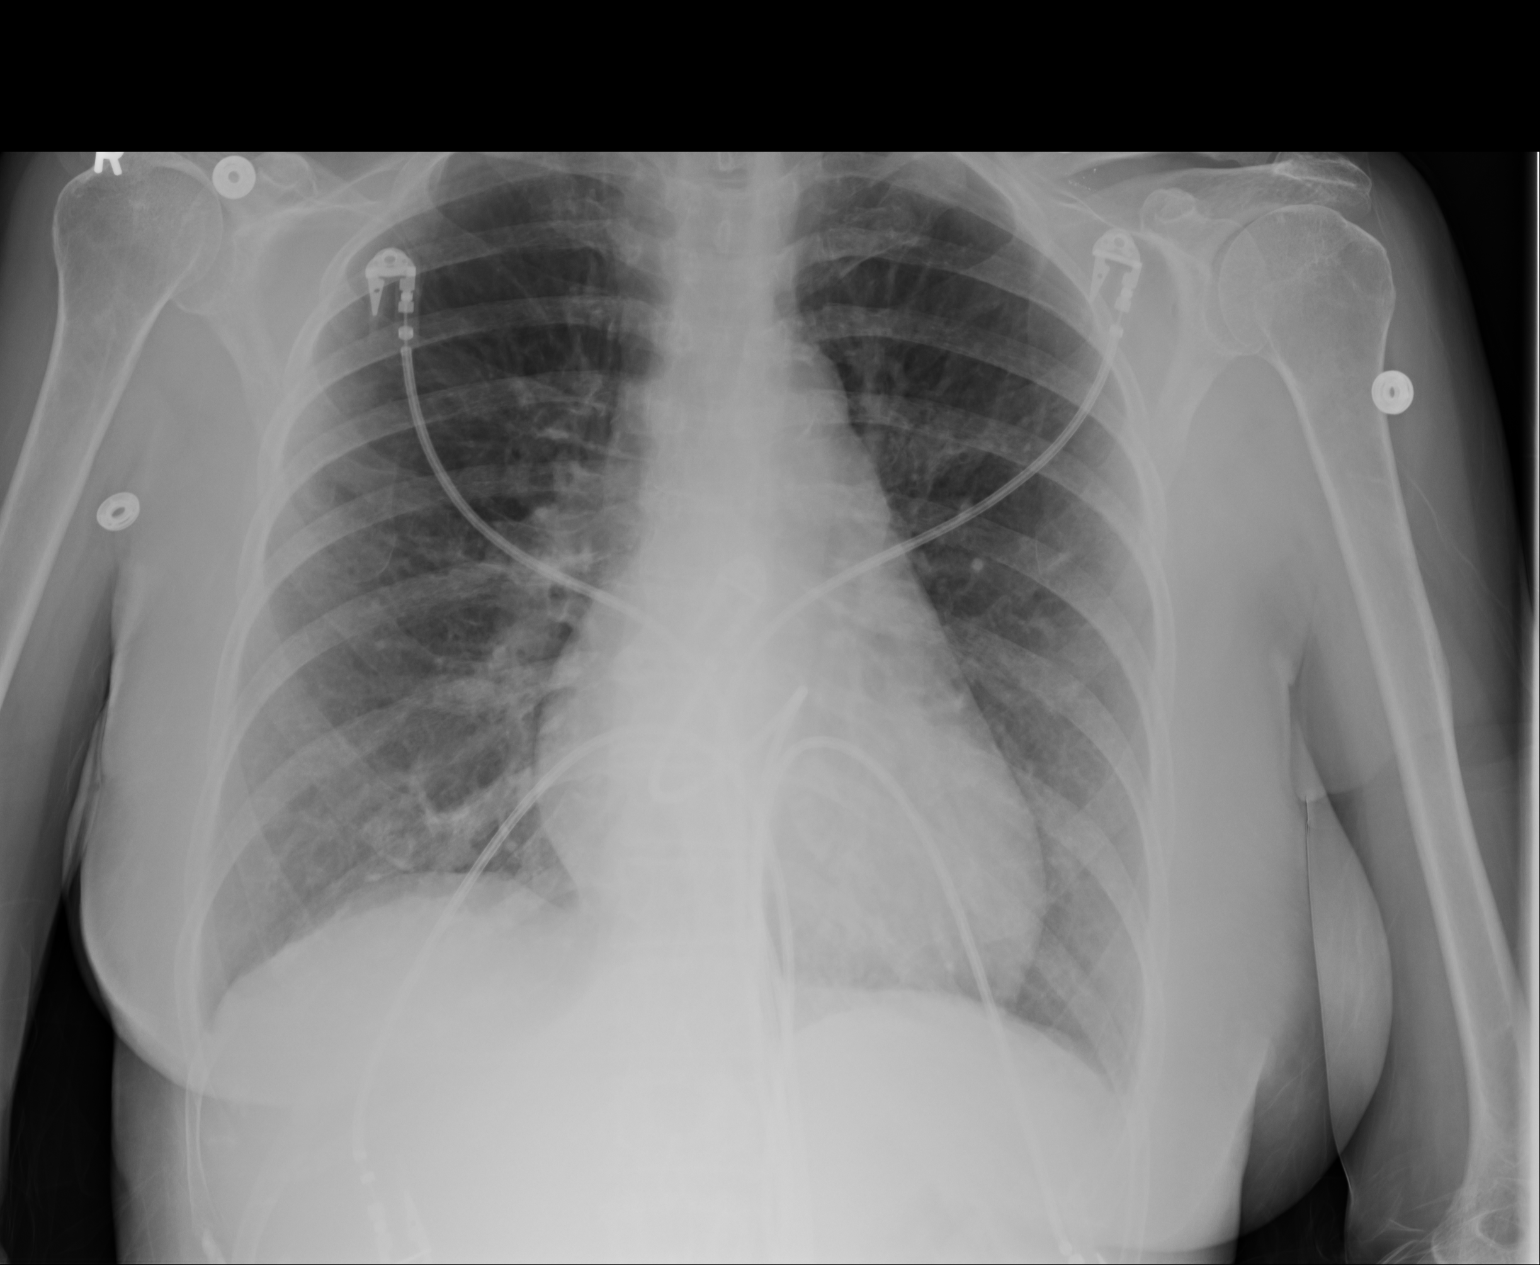

[1 of 1 positions shown; findings below may reference images not displayed]

PROCEDURE:     DXR - DXR PORTABLE CHEST SINGLE VIEW  - [DATE]  [DATE]

RESULT:     Comparison is made to study of [DATE].

The lungs are hyperinflated consistent with COPD. The interstitial markings
are mildly prominent. Correlate for fibrosis versus edema. The cardiac
silhouette is within normal limits. There is no effusion or pneumothorax.
The bony and mediastinal structures are unremarkable.
IMPRESSION: 1. Mild prominence of the interstitial markings. Correlate for chronic
inflammation and fibrosis versus mild edema.

[REDACTED]

## 2012-10-20 LAB — CBC WITH DIFFERENTIAL/PLATELET
Basophil #: 0 10*3/uL (ref 0.0–0.1)
Basophil %: 0.1 %
Eosinophil %: 0 %
HGB: 11.9 g/dL — ABNORMAL LOW (ref 12.0–16.0)
Lymphocyte #: 1 10*3/uL (ref 1.0–3.6)
MCV: 94 fL (ref 80–100)
Monocyte #: 1 x10 3/mm — ABNORMAL HIGH (ref 0.2–0.9)
Platelet: 322 10*3/uL (ref 150–440)
RBC: 3.81 10*6/uL (ref 3.80–5.20)
RDW: 14.2 % (ref 11.5–14.5)

## 2012-10-20 LAB — BASIC METABOLIC PANEL
Anion Gap: 8 (ref 7–16)
BUN: 21 mg/dL — ABNORMAL HIGH (ref 7–18)
Chloride: 110 mmol/L — ABNORMAL HIGH (ref 98–107)
Co2: 23 mmol/L (ref 21–32)
EGFR (African American): >60
EGFR (Non-African Amer.): >60
Glucose: 138 mg/dL — ABNORMAL HIGH (ref 65–99)
Osmolality: 286 (ref 275–301)
Potassium: 4 mmol/L (ref 3.5–5.1)

## 2012-10-20 IMAGING — CR DG CHEST 2V
1 series · 2 of 2 positions shown · non-contrast
Comparison: none

REASON FOR EXAM: SOB
COMMENTS:

PROCEDURE:     DXR - DXR CHEST PA (OR AP) AND LATERAL  - [DATE]  [DATE]
RESULT:
Comparison is made to a prior study dated [DATE].

[Series 1: pa · 0.17mm/px · 2 of 2 slices shown]
[im 1/2]
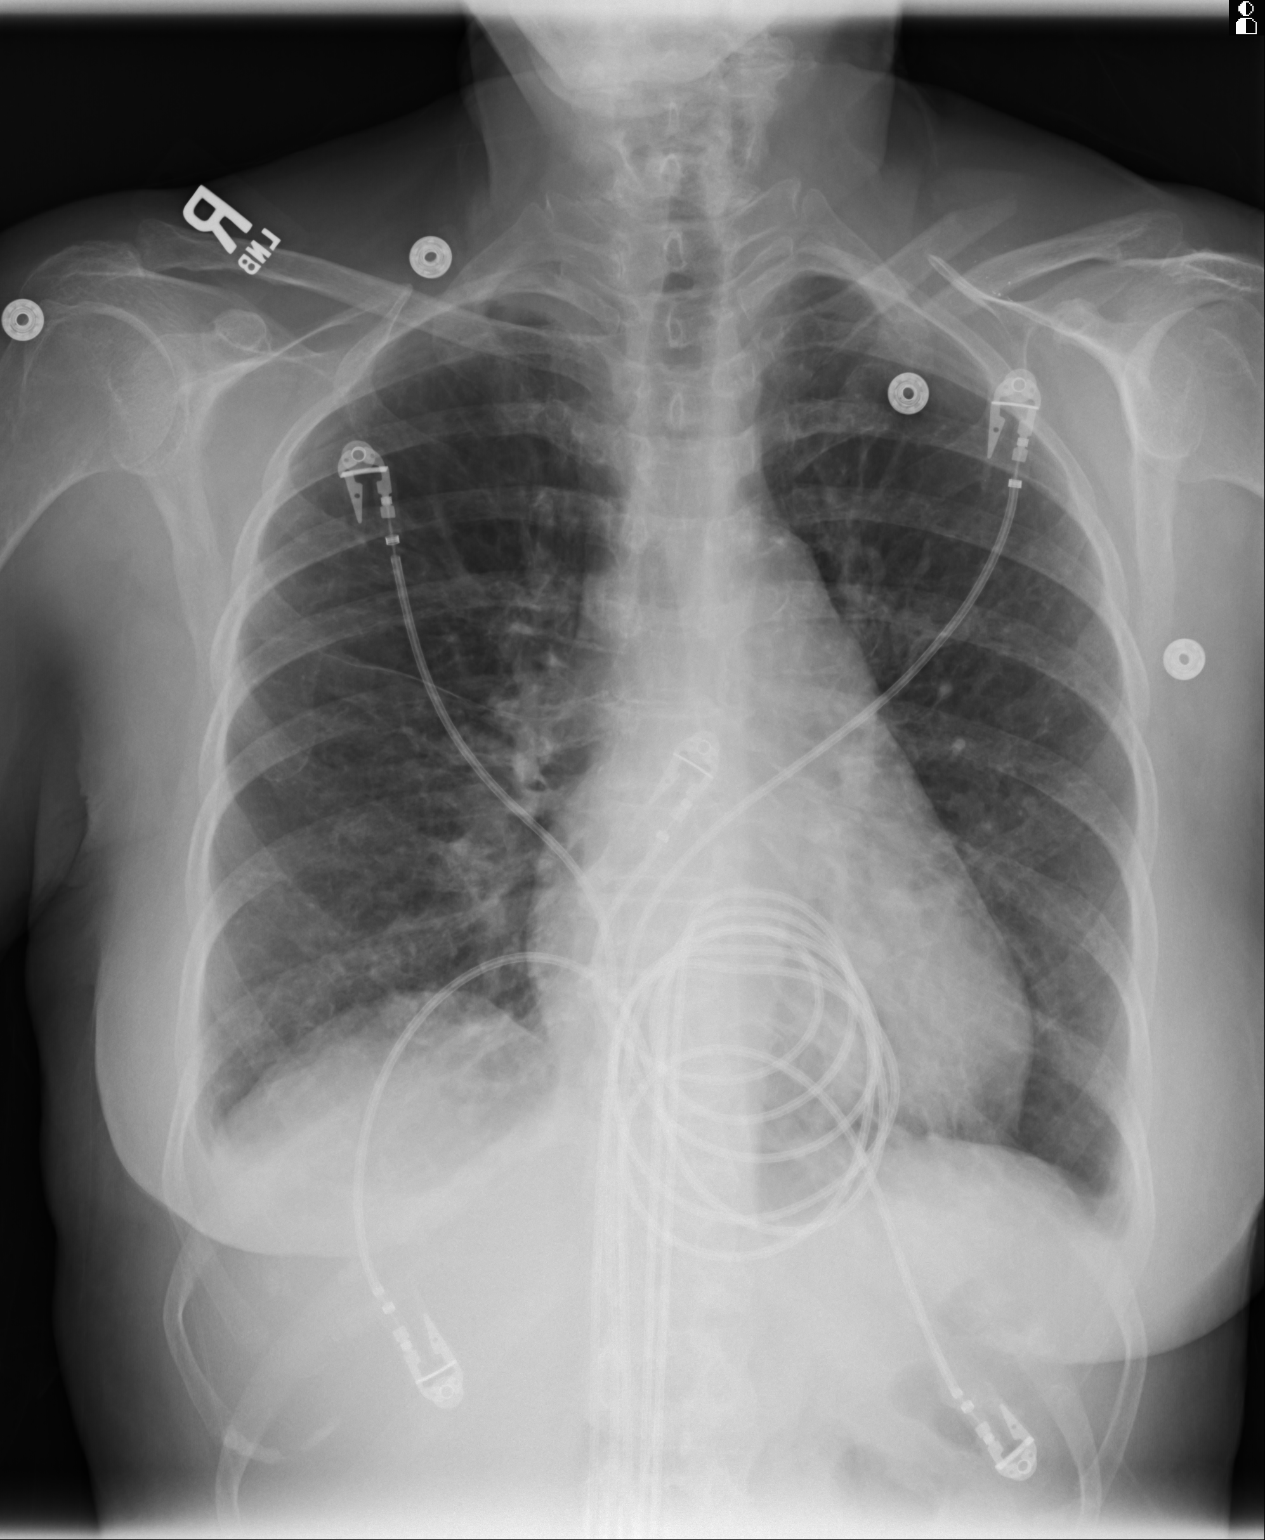
[im 2/2]
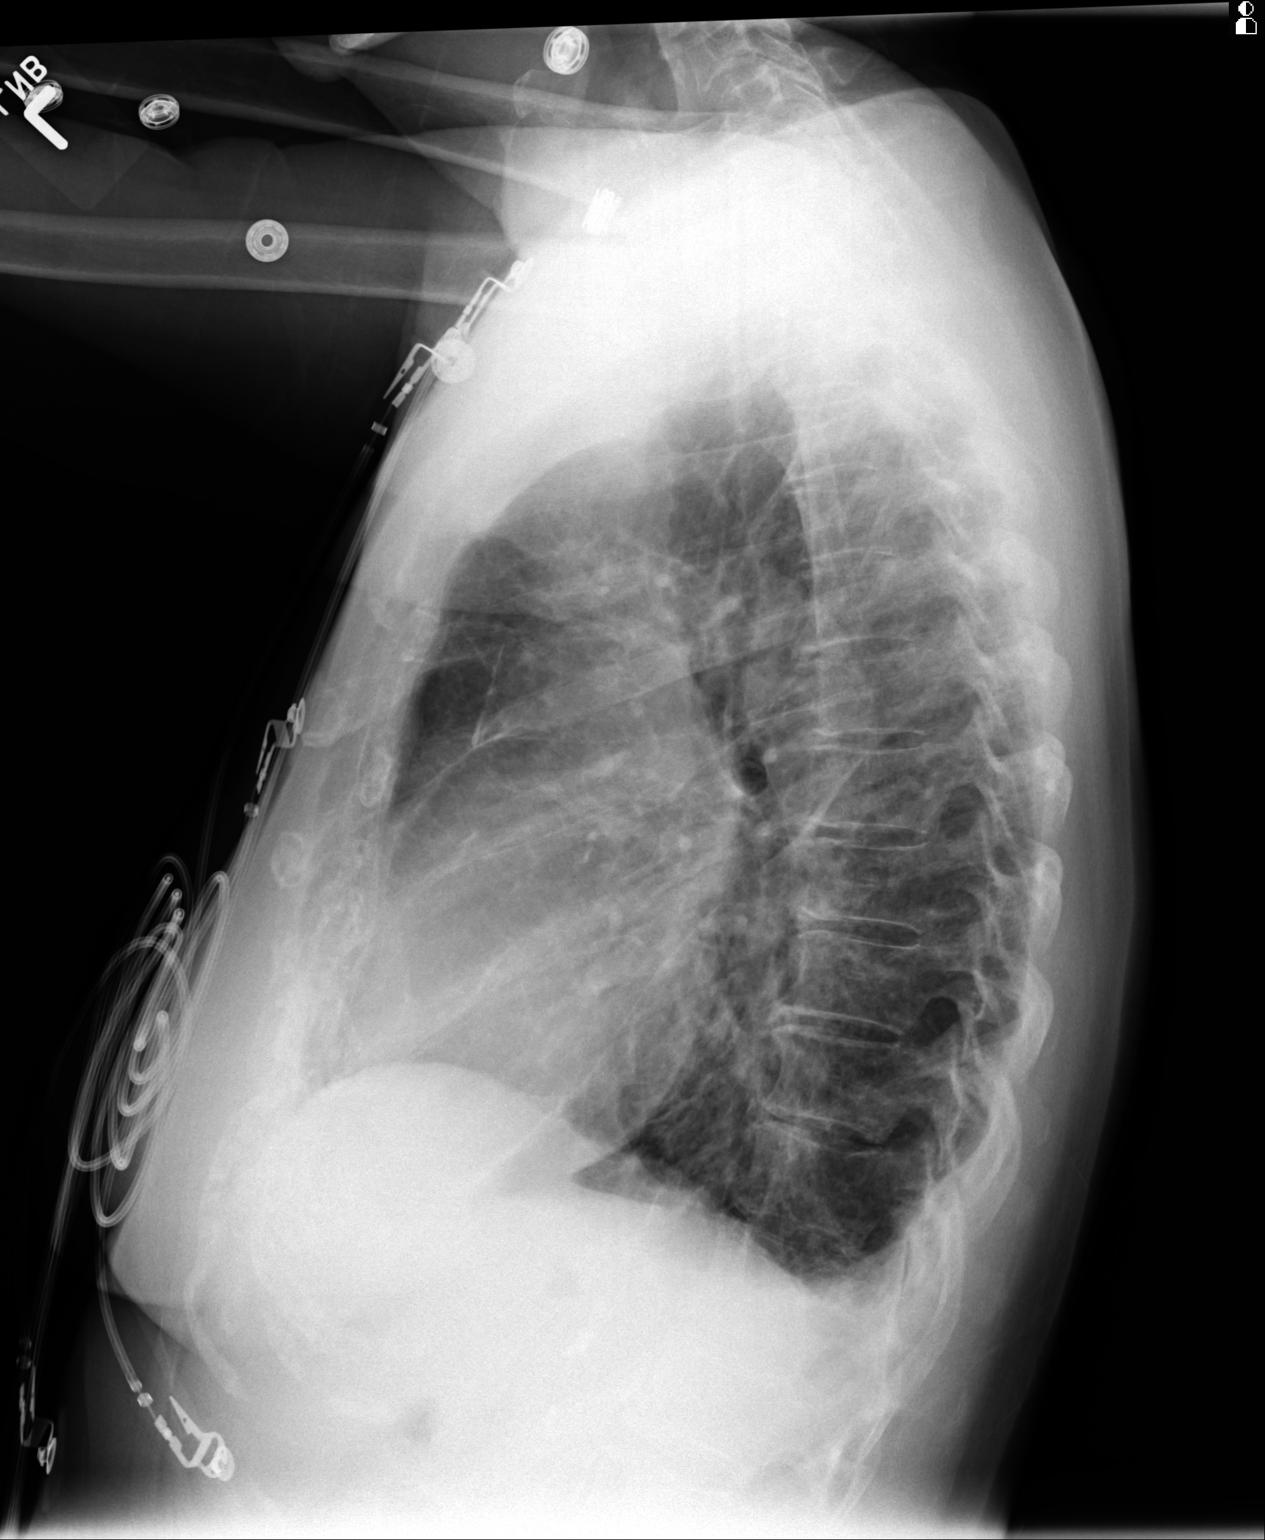

[2 of 2 positions shown; findings below may reference images not displayed]

FINDINGS: There is prominence of the interstitial markings with no focal
regions of consolidation. Blunting of the costophrenic angles is
appreciated, left greater than right. There appears to be a component of
peribronchial cuffing. The cardiac silhouette is moderately enlarged. The
visualized bony skeleton is unremarkable.
IMPRESSION: Interstitial infiltrate and differential considerations are
infectious versus inflammatory infiltrate. A component of pulmonary edema
cannot be excluded. Surveillance evaluation recommended.

## 2012-10-21 LAB — CBC WITH DIFFERENTIAL/PLATELET
Basophil #: 0 10*3/uL (ref 0.0–0.1)
Eosinophil %: 0 %
HCT: 33.1 % — ABNORMAL LOW (ref 35.0–47.0)
MCV: 94 fL (ref 80–100)
Monocyte #: 0.5 x10 3/mm (ref 0.2–0.9)
Monocyte %: 5.7 %
Neutrophil #: 7.8 10*3/uL — ABNORMAL HIGH (ref 1.4–6.5)
Platelet: 289 10*3/uL (ref 150–440)
RDW: 14.3 % (ref 11.5–14.5)

## 2012-10-21 LAB — BASIC METABOLIC PANEL
Creatinine: 0.91 mg/dL (ref 0.60–1.30)
EGFR (African American): >60
Glucose: 125 mg/dL — ABNORMAL HIGH (ref 65–99)
Potassium: 4.7 mmol/L (ref 3.5–5.1)
Sodium: 141 mmol/L (ref 136–145)

## 2012-10-23 LAB — CULTURE, BLOOD (SINGLE)

## 2012-12-26 ENCOUNTER — Ambulatory Visit: Payer: Self-pay | Admitting: Orthopedic Surgery

## 2012-12-26 IMAGING — CR DG CLAVICLE*L*
1 series · 2 of 2 positions shown · non-contrast
Comparison: none

REASON FOR EXAM: post op ORIF
COMMENTS:   LMP: Post-Menopausal

[Series 1: ap/pa · 0.17mm/px · 2 of 2 slices shown]
[im 1/2]
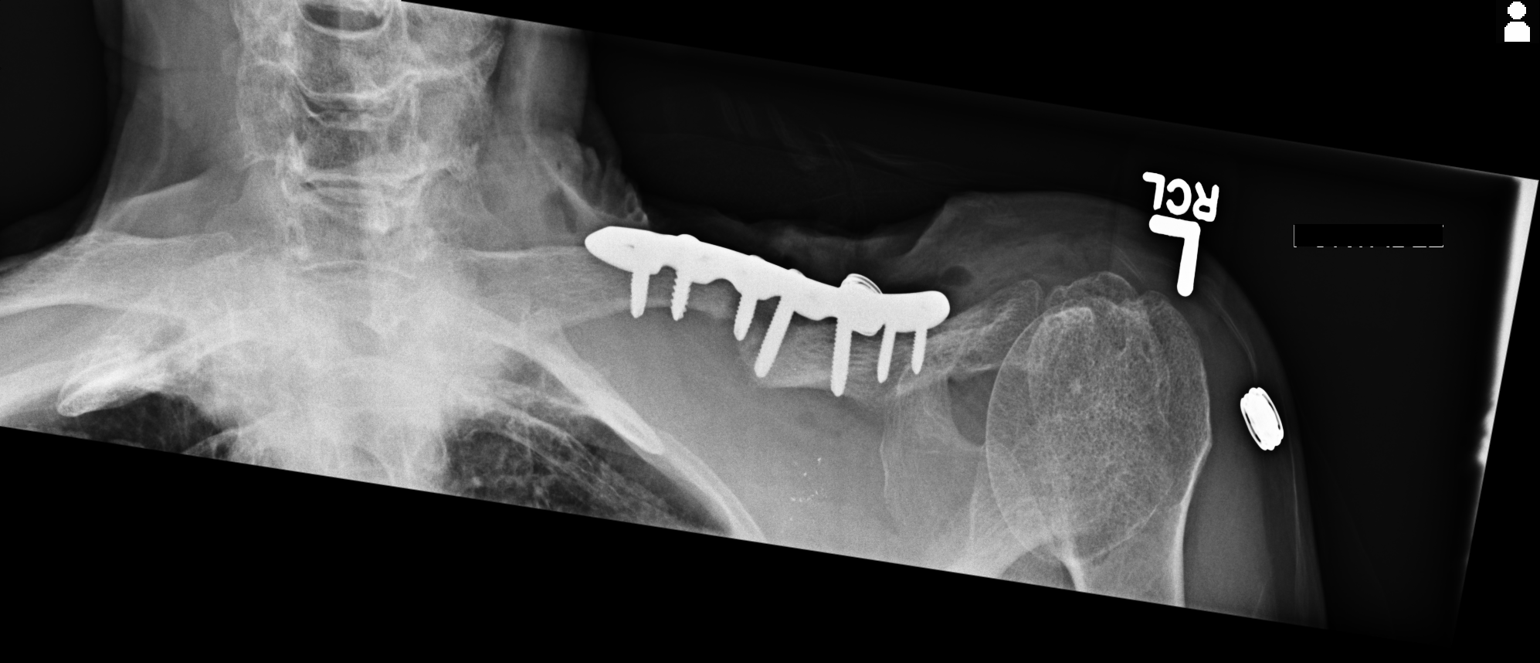
[im 2/2]
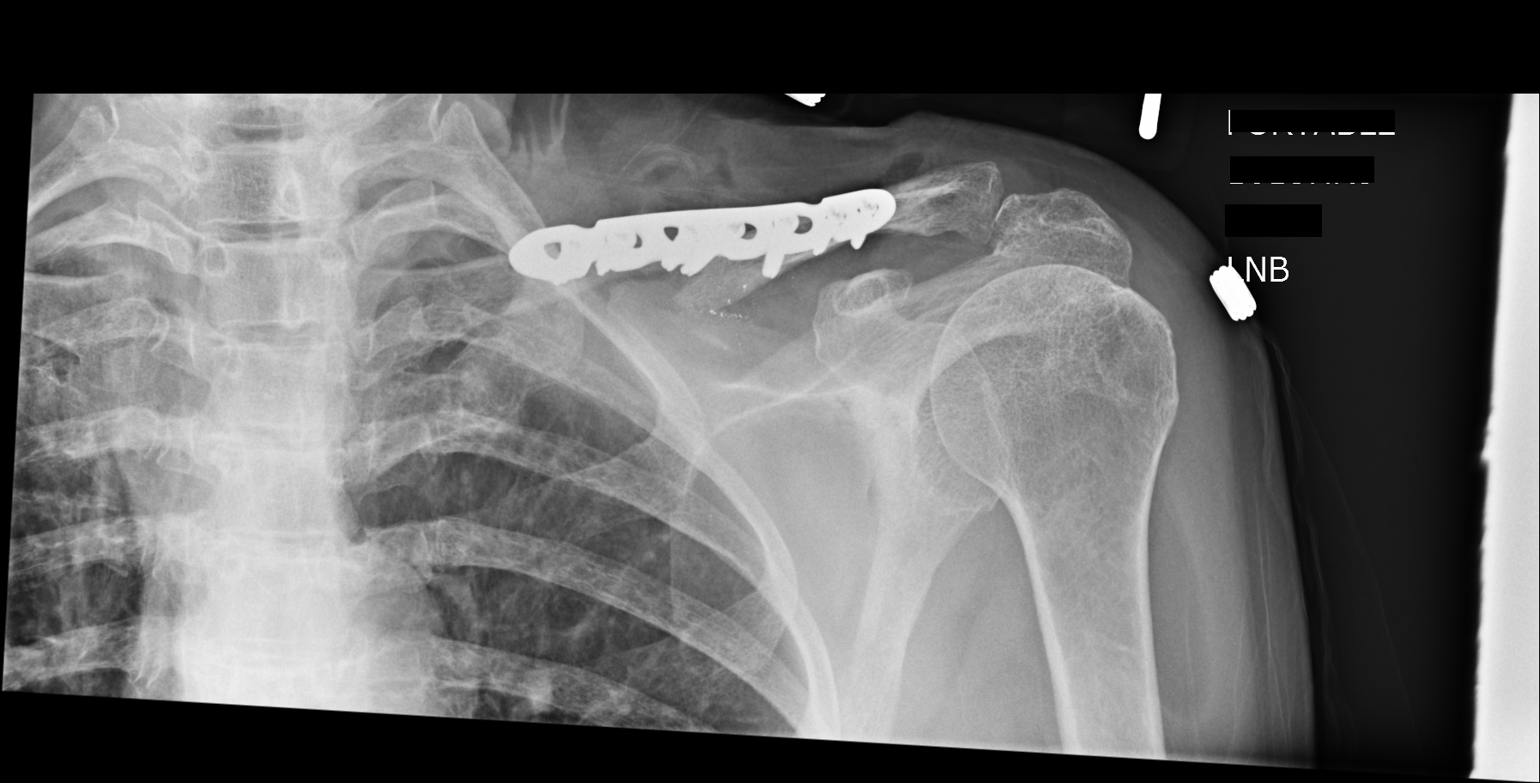

[2 of 2 positions shown; findings below may reference images not displayed]

PROCEDURE:     DXR - DXR CLAVICLE LEFT  - [DATE]  [DATE]

RESULT:     The there is an orthopedic plate with multiple screws over the
left clavicle obscuring visualization of the clavicle. The bones are
osteopenic. There is mild separation at the fracture site. The hardware
appears intact. Some small densities are seen in the infraclavicular region
which could be small metallic or bony fragments.
IMPRESSION: 1. Status post ORIF of the left clavicle fracture as described.

[REDACTED]

## 2012-12-26 IMAGING — CR DG CHEST 1V PORT
1 series · 1 of 1 positions shown · non-contrast
Comparison: none

REASON FOR EXAM: post op
COMMENTS:

[ap]
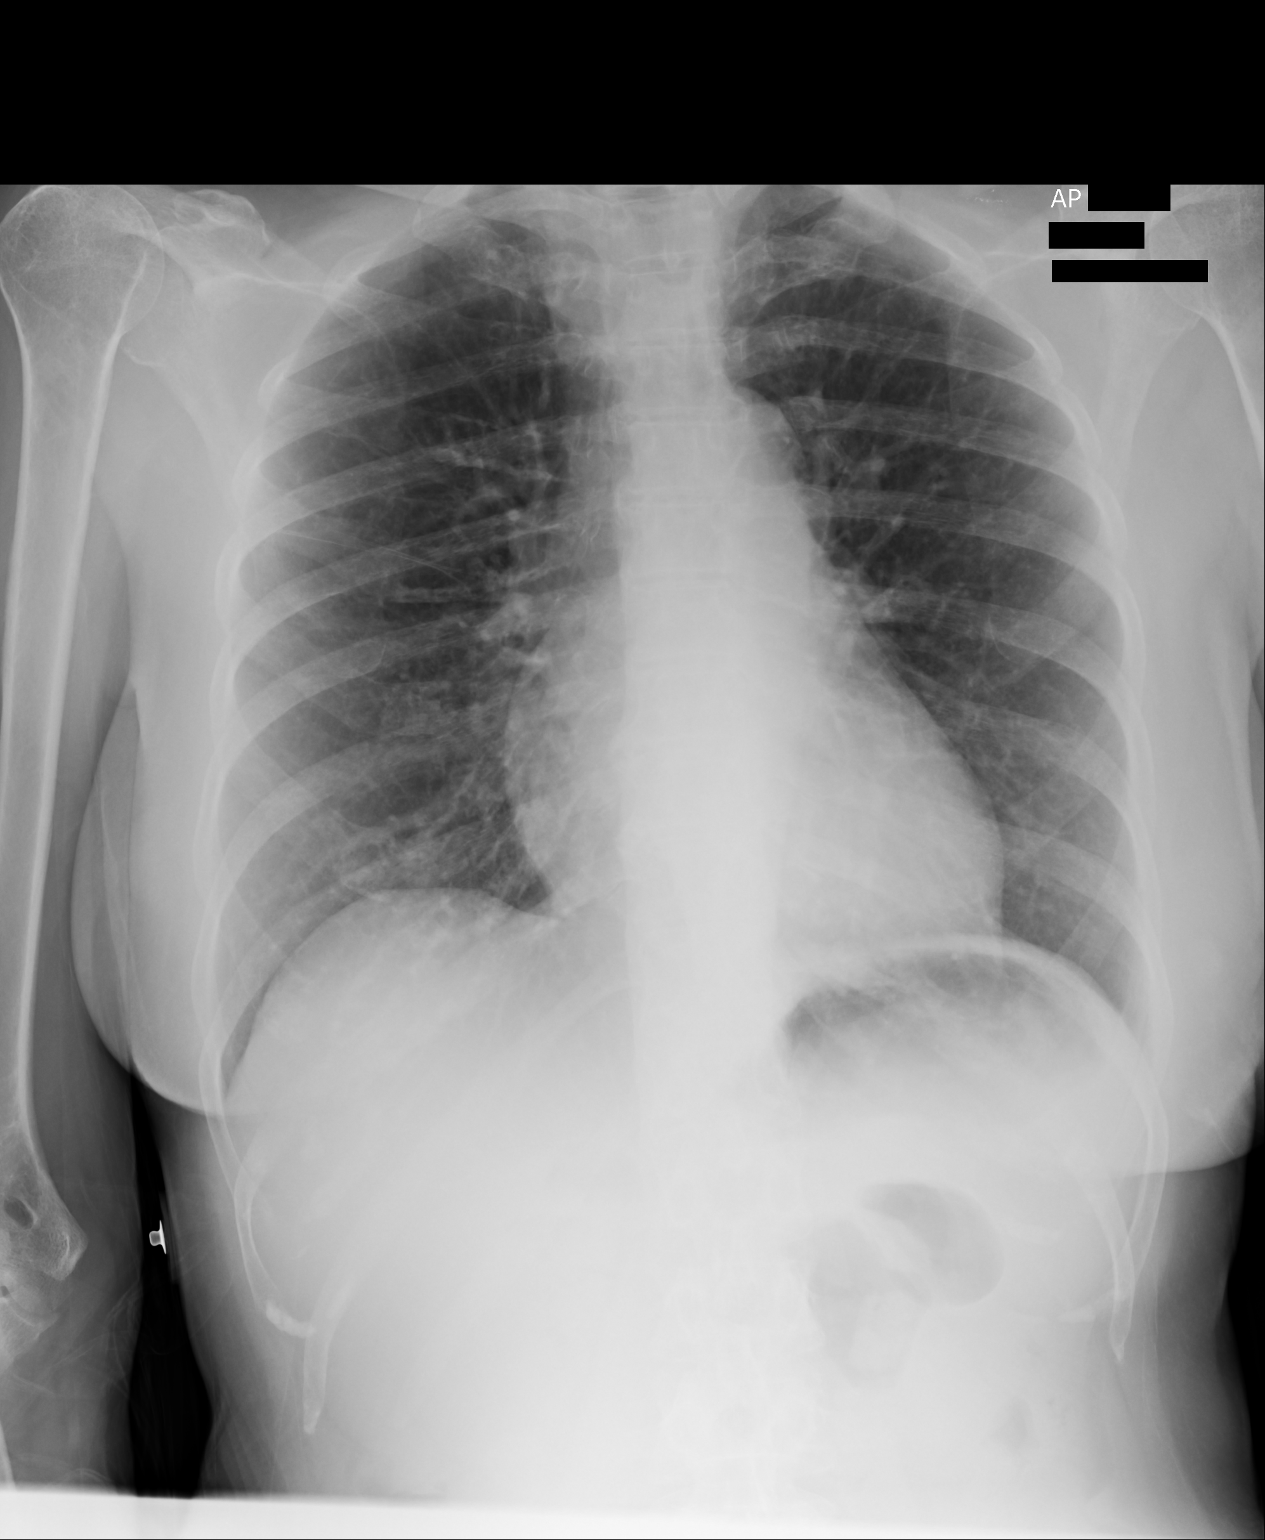

[1 of 1 positions shown; findings below may reference images not displayed]

PROCEDURE:     DXR - DXR PORTABLE CHEST SINGLE VIEW  - [DATE]  [DATE]

RESULT:     Comparison is made to the previous examination dated [DATE].
There is an orthopedic plate of the left clavicle with multiple screws in
place. The lungs are clear. Heart and pulmonary vessels are normal. The bony
structures otherwise appear to be unremarkable.
IMPRESSION: 1. No acute cardiopulmonary disease evident.

[REDACTED]

## 2013-04-24 ENCOUNTER — Ambulatory Visit: Payer: Self-pay | Admitting: Internal Medicine

## 2013-04-24 IMAGING — MG MM DIGITAL SCREENING BILAT W/ CAD
1 series · 5 of 5 positions shown · non-contrast
Comparison: Previous exam(s).

CLINICAL DATA: Screening.

EXAM:
DIGITAL SCREENING BILATERAL MAMMOGRAM WITH CAD

[R CC · right · 5 of 5 slices shown]
[im 1/5]
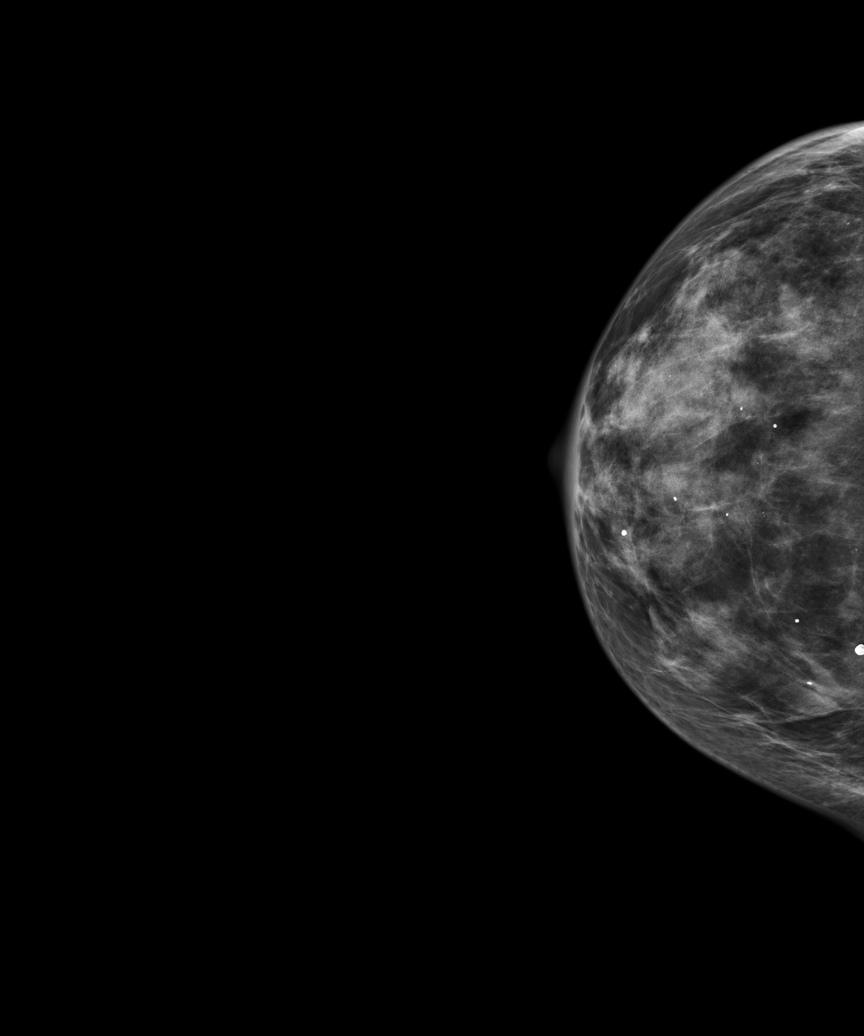
[im 2/5]
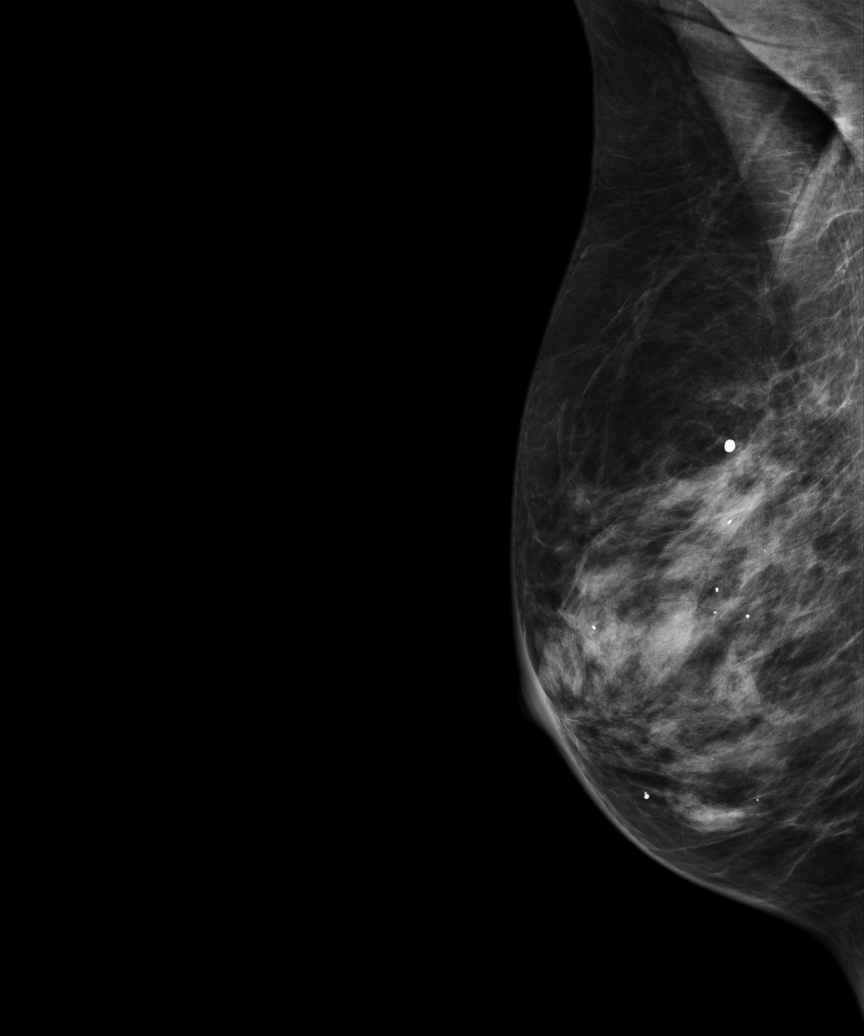
[im 3/5]
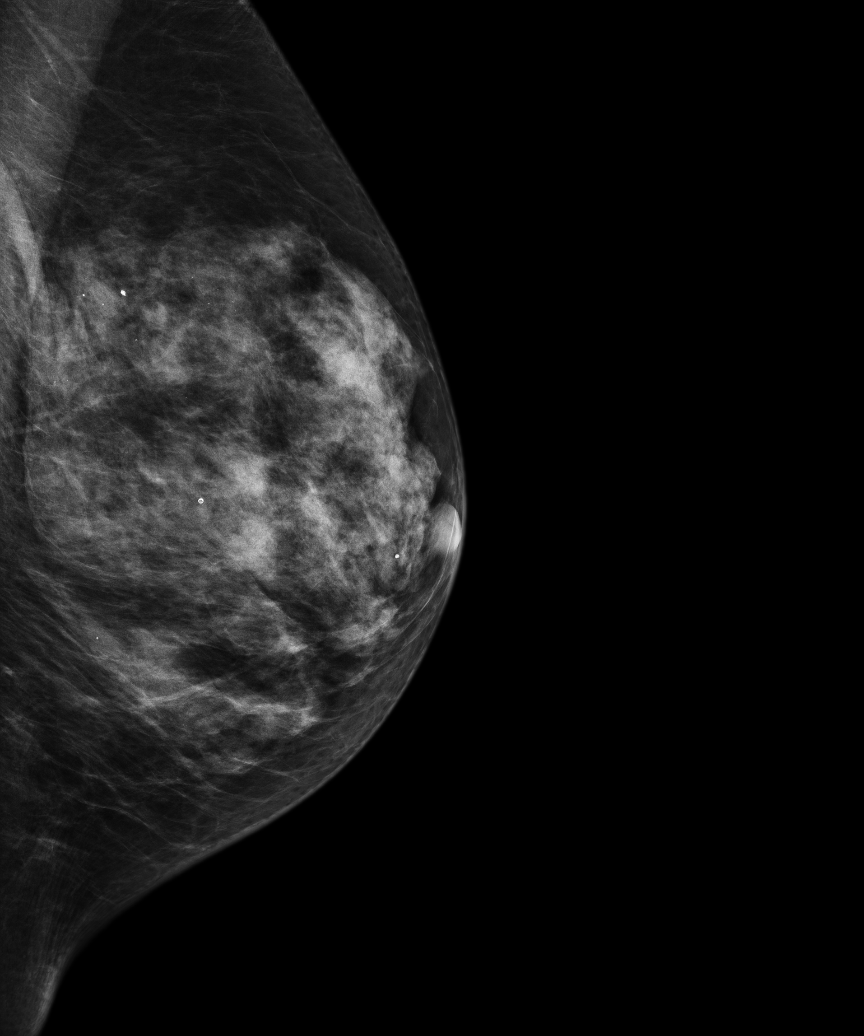
[im 4/5]
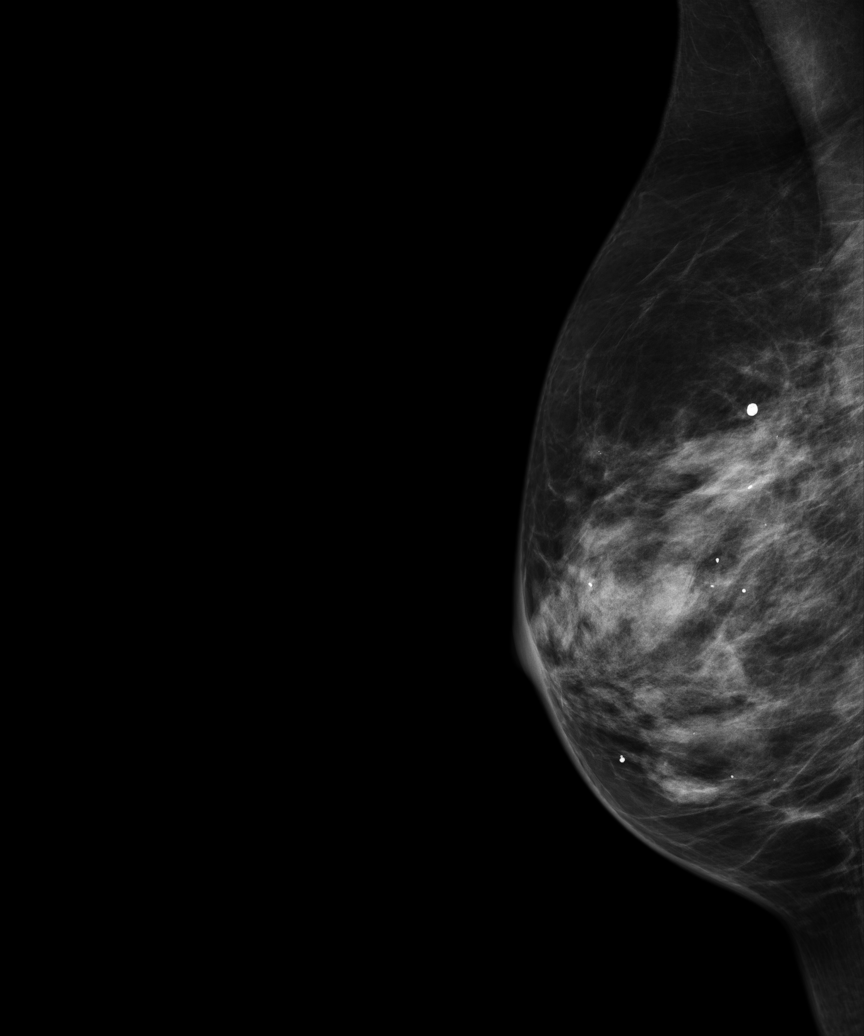
[im 5/5]
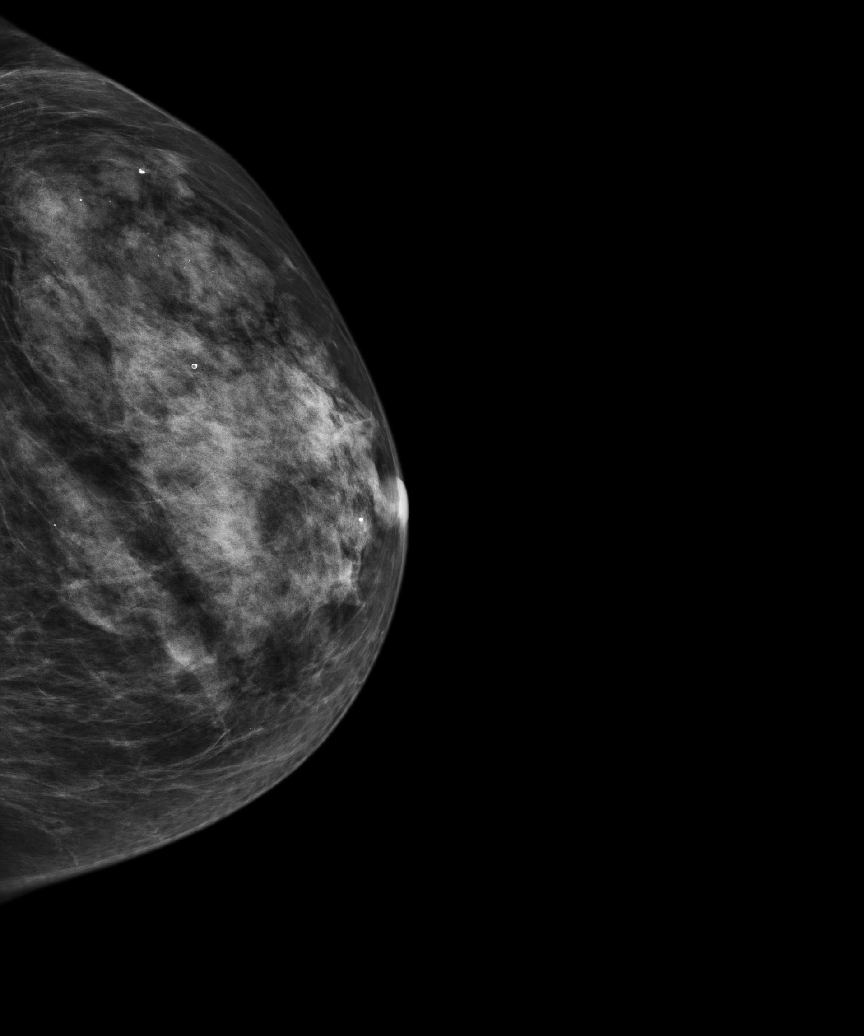

[5 of 5 positions shown; findings below may reference images not displayed]

ACR Breast Density Category c: The breast tissue is heterogeneously
dense, which may obscure small masses.
FINDINGS: There are no findings suspicious for malignancy. Images were
processed with CAD.
IMPRESSION: No mammographic evidence of malignancy. A result letter of this
screening mammogram will be mailed directly to the patient.

RECOMMENDATION:
Screening mammogram in one year. (Code:[1S])

BI-RADS CATEGORY  1: Negative

## 2013-07-02 ENCOUNTER — Ambulatory Visit: Payer: Self-pay | Admitting: Unknown Physician Specialty

## 2013-07-06 LAB — PATHOLOGY REPORT

## 2013-10-23 ENCOUNTER — Ambulatory Visit: Payer: Self-pay | Admitting: Internal Medicine

## 2013-10-23 IMAGING — RF DG BARIUM SWALLOW
3 series · 14 of 14 positions shown · non-contrast
Comparison: None.

CLINICAL DATA: Symptoms of mild dysphagia for 1 year following ORIF
for left clavicle fracture

EXAM:
ESOPHOGRAM / BARIUM SWALLOW / BARIUM TABLET STUDY
TECHNIQUE: Combined double contrast and single contrast examination performed
using effervescent crystals, thick barium liquid, and thin barium
liquid. The patient was observed with fluoroscopy swallowing a 13mm
barium sulphate tablet.
FLUOROSCOPY TIME:  0 min 54 seconds

[Series 1: fluoro_barium singleshot_bw · 0.18mm/px · 7 of 7 slices shown]
[im 1/7]
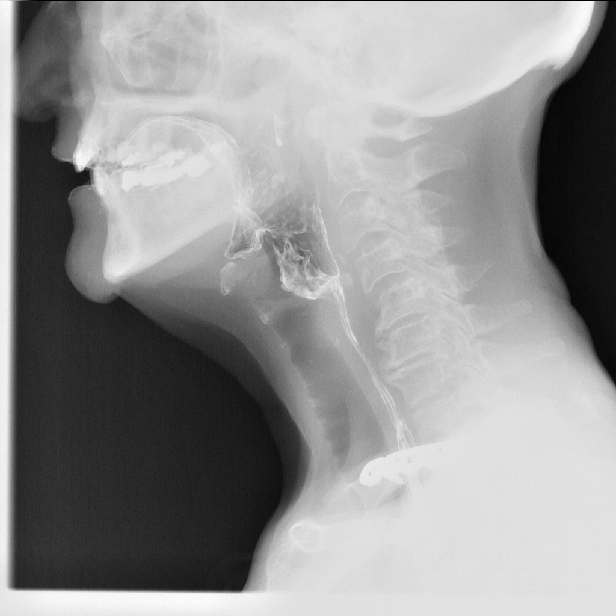
[im 2/7]
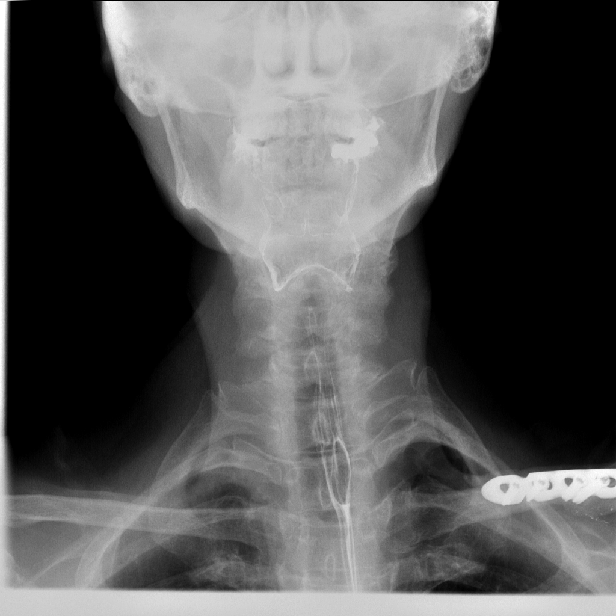
[im 3/7]
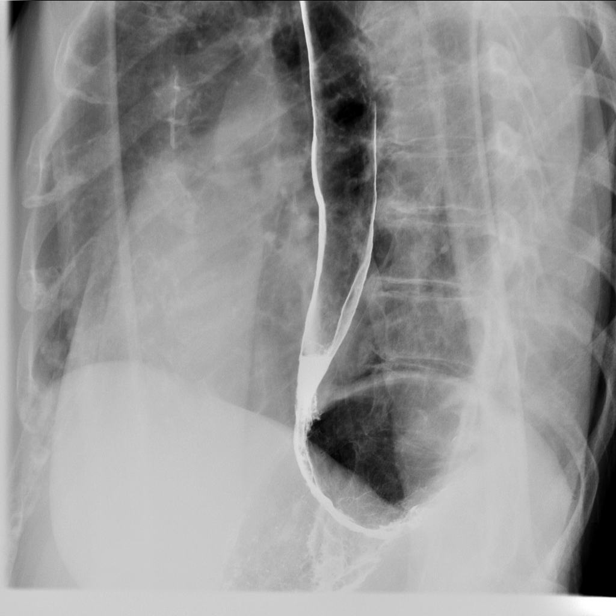
[im 4/7]
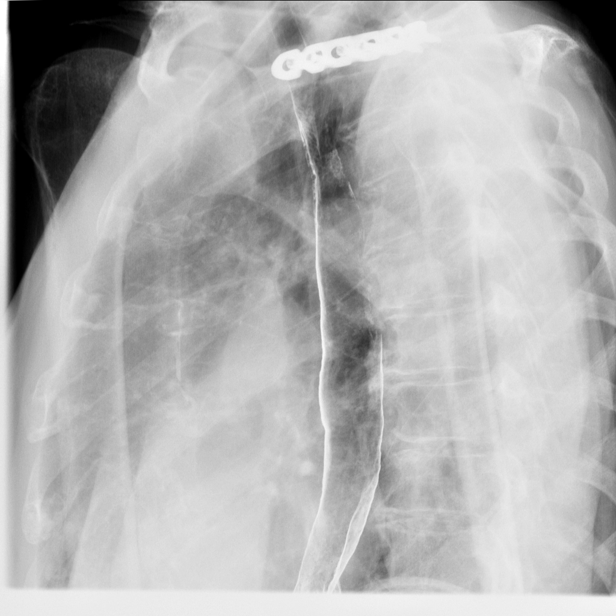
[im 5/7]
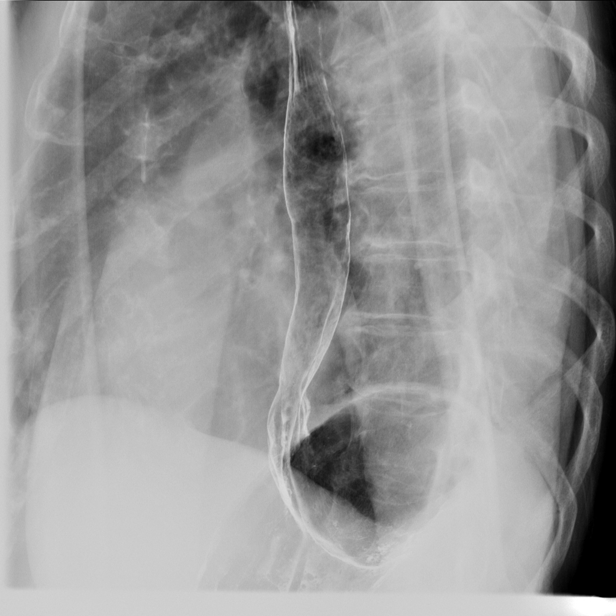
[im 6/7]
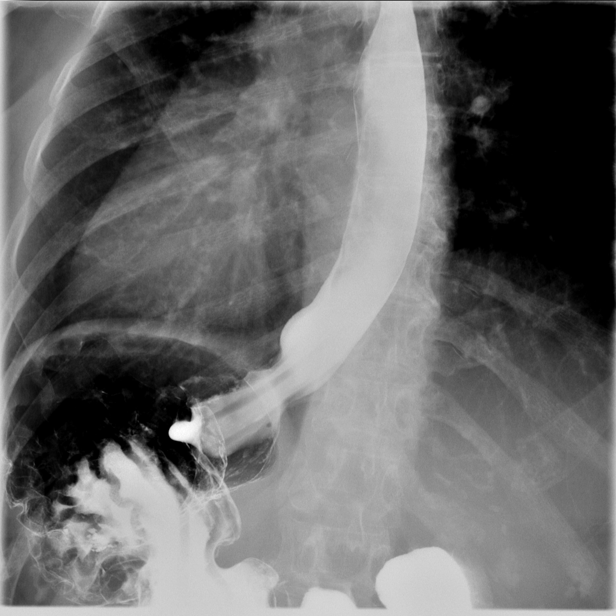
[im 7/7]
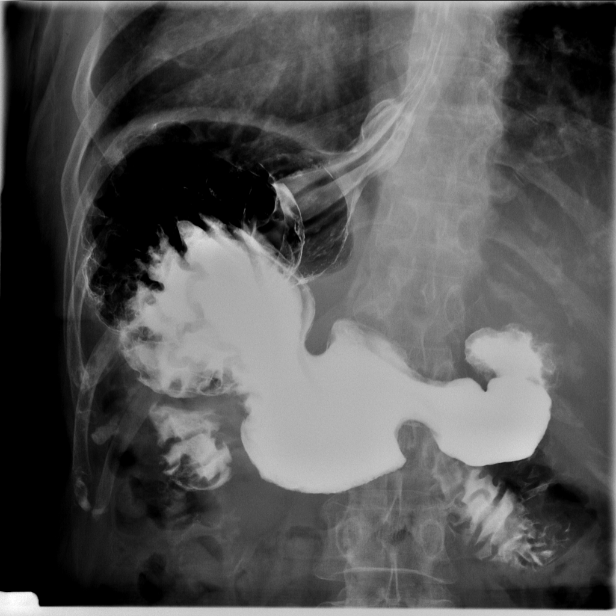

[Series 6: fluoro_barium 2fps_bw · 0.20mm/px · 3 of 5 frames shown (1 of 2)]
[frame 1/5]
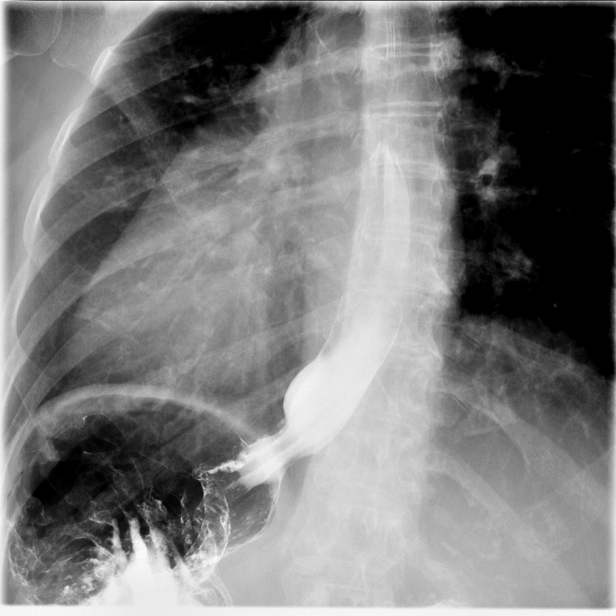
[frame 3/5]
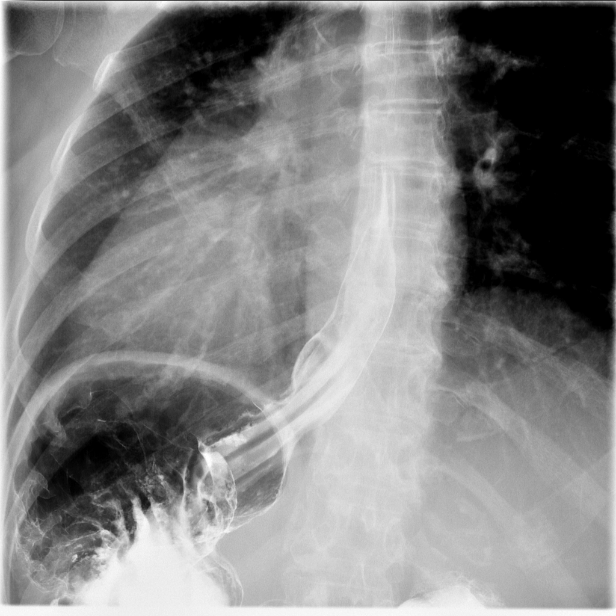
[frame 5/5]
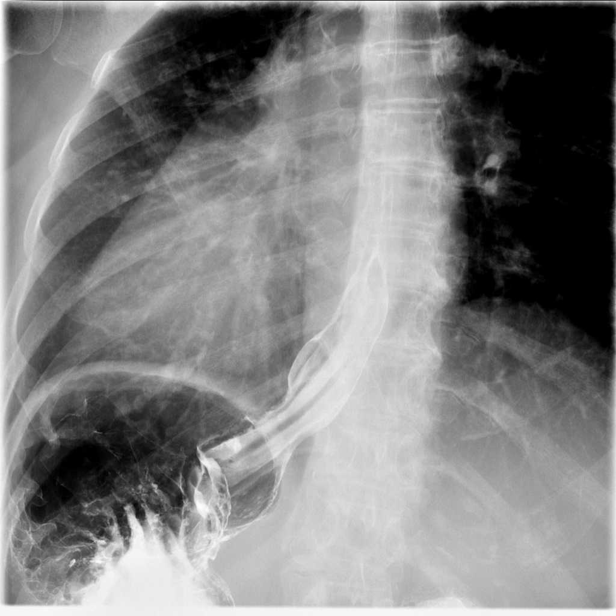

[Series 7: fluoro_barium 2fps_bw · 0.20mm/px · 4 of 11 frames shown (2 of 2)]
[frame 2/11]
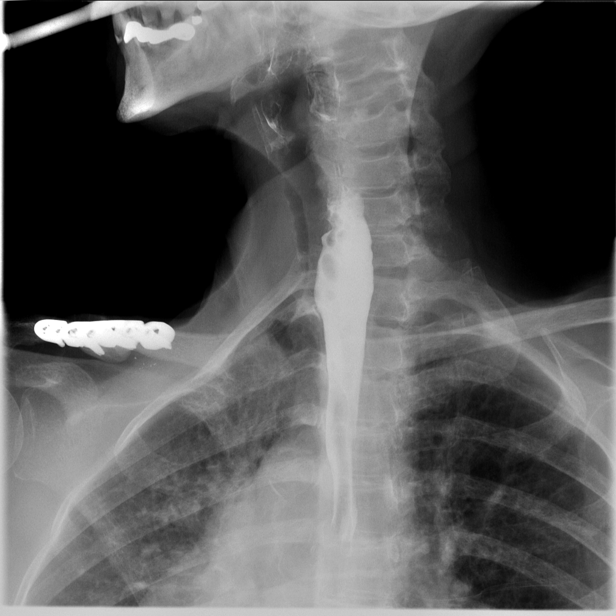
[frame 5/11]
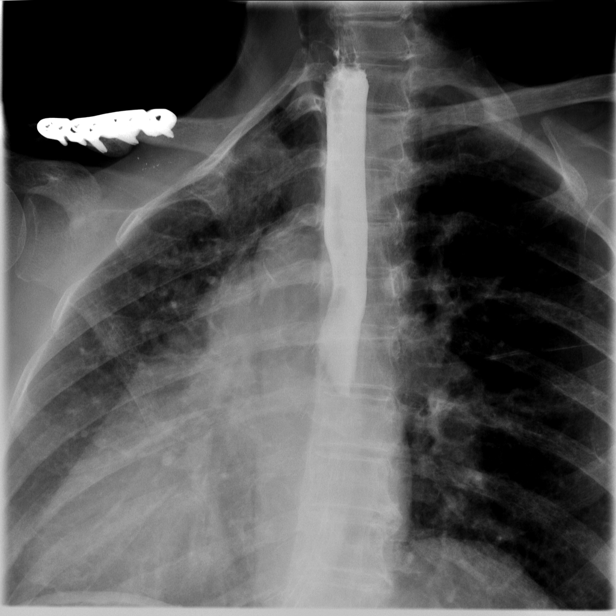
[frame 6/11]
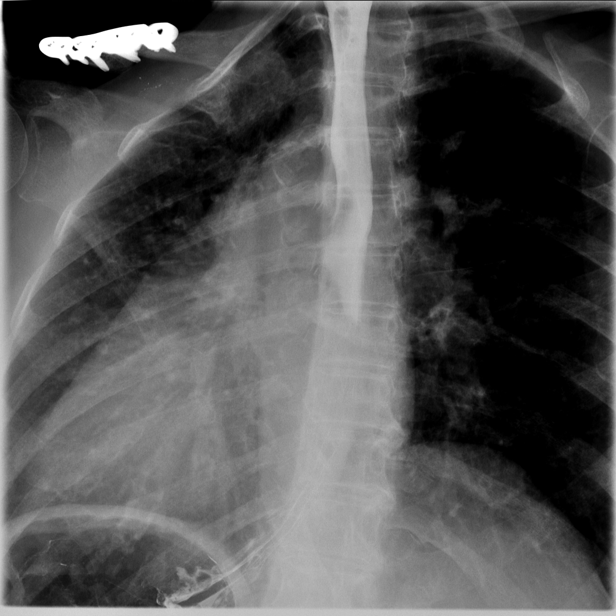
[frame 10/11]
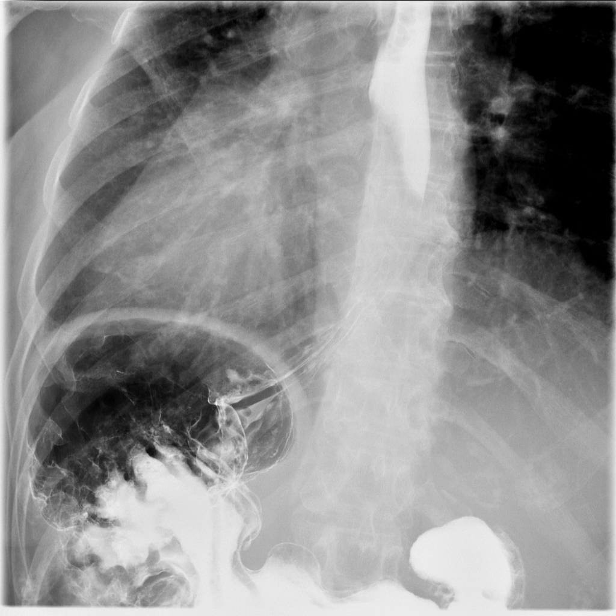

[14 of 14 positions shown; findings below may reference images not displayed]

FINDINGS: The patient ingested thick and thin barium and the gas-forming
crystals without difficulty. The cervical esophagus distended well.
There was no laryngeal penetration of the barium. The thoracic
esophagus distended well. There is no evidence of esophagitis. There
was no fixed stricture. Esophageal motility was normal in the
upright and prone positions. There was no significant hiatal hernia
and no reflux was elicited.

Upon attempting to swallow the barium tablet the patient had a
coughing episode with some wheezing. The patient's spat the tablet
back in the cup, and with continued coughing was able to clear her
upper airway. She describes similar but less severe episodes at home
over the past year.
IMPRESSION: 1. No observed laryngeal penetration of the barium was demonstrated.
However, the patient was unable to tolerate the barium tablet and
had a coughing paroxysm during the attempt to ingest the tablet.
Given the temporal relationship of the patient's current symptoms
with the previous endotracheal anesthesia for the clavicle repair,
further evaluation of the cervical airway with direct visualization
would be useful.
2. The thoracic esophagus is normal.

## 2014-05-07 ENCOUNTER — Ambulatory Visit: Payer: Self-pay | Admitting: Internal Medicine

## 2014-05-07 IMAGING — MG MM DIGITAL SCREENING BILAT W/ CAD
5 series · 5 of 5 positions shown · non-contrast
Comparison: Previous Exam(s)

CLINICAL DATA: Screening.

EXAM:
DIGITAL SCREENING BILATERAL MAMMOGRAM WITH CAD

[R MLO]
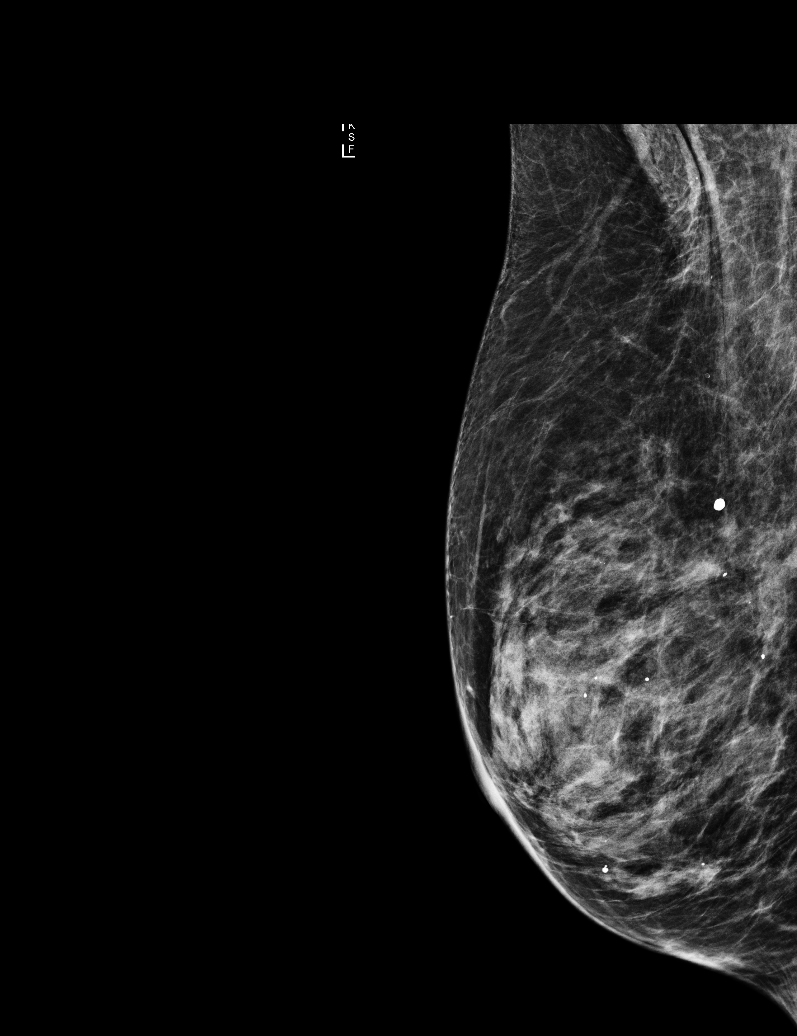

[L CC]
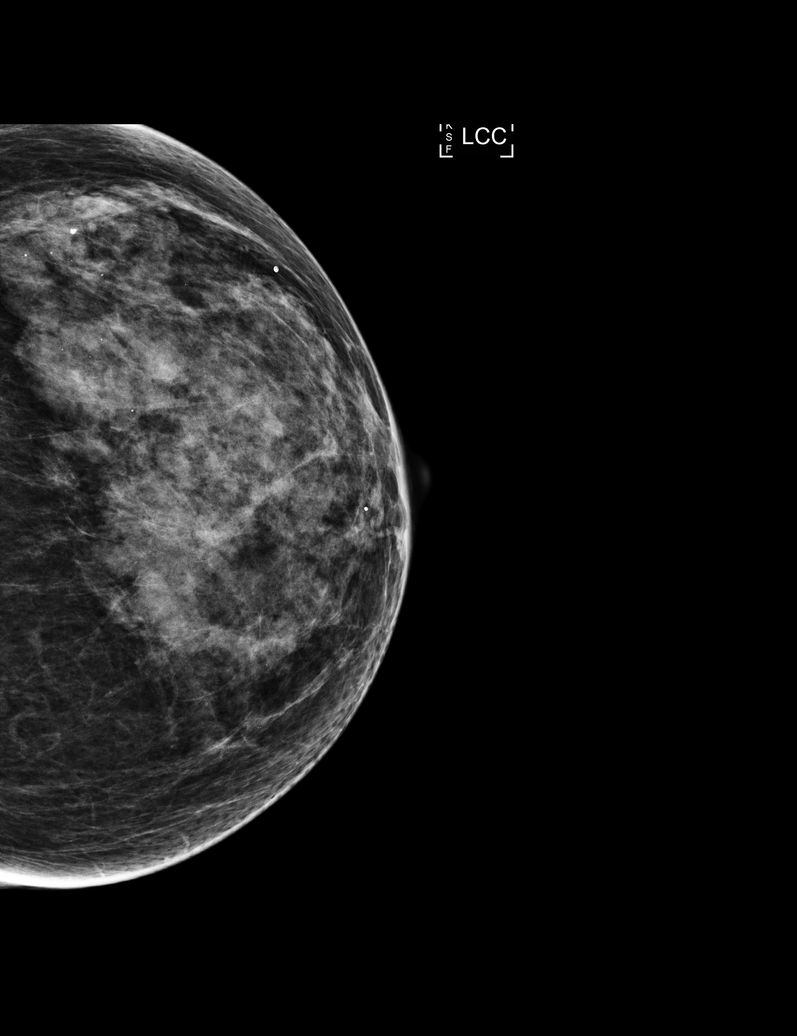

[L MLO (1 of 2)]
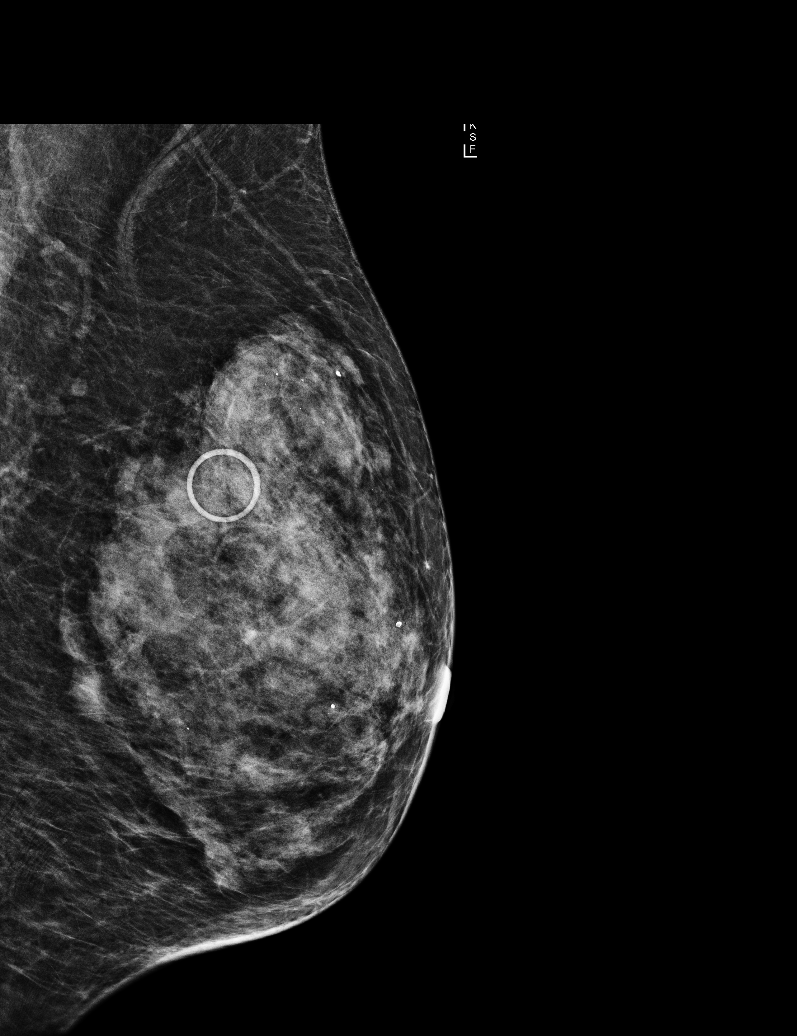

[R CC]
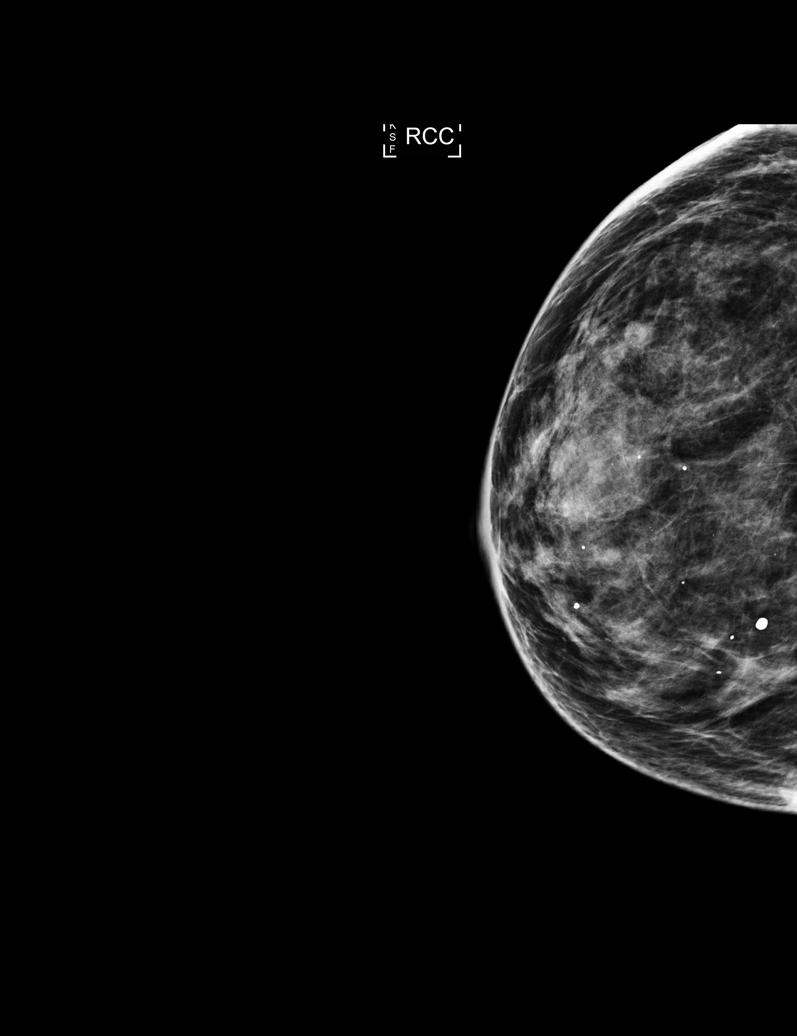

[L MLO (2 of 2)]
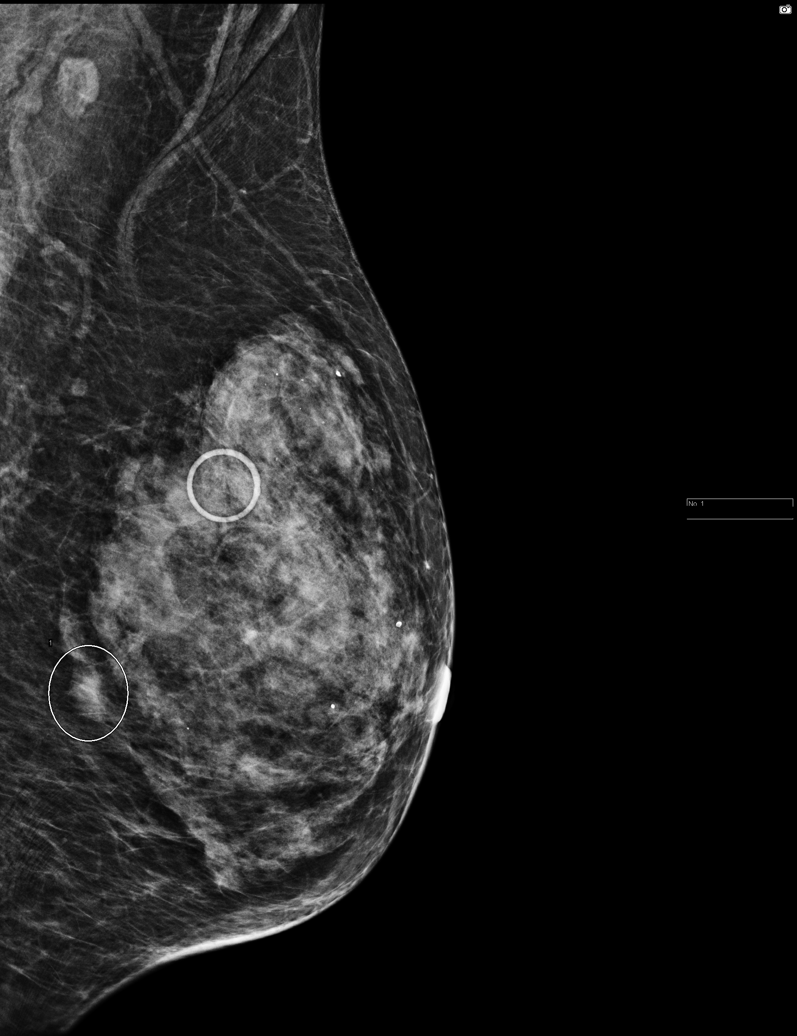

[5 of 5 positions shown; findings below may reference images not displayed]

ACR Breast Density Category c: The breast tissue is heterogeneously
dense, which may obscure small masses.
FINDINGS: Bilateral asymmetries require further evaluation with spot
compression views and possibly ultrasound.

Images were processed with CAD.
IMPRESSION: Further evaluation suggested for possible asymmetry within the right
breast.

Further evaluation suggested for possible asymmetry within the left
breast.

RECOMMENDATION:
Diagnostic mammogram and possibly ultrasound of both breasts.
(Code:[QJ])

The patient will be contacted regarding the findings, and additional
imaging will be scheduled.

BI-RADS CATEGORY  0: Incomplete. Need additional imaging evaluation
and/or prior mammograms for comparison.

## 2014-05-08 ENCOUNTER — Ambulatory Visit: Payer: Self-pay | Admitting: Internal Medicine

## 2014-05-08 IMAGING — US US BREAST*R* LIMITED INC AXILLA
1 series · 1 of 1 positions shown · non-contrast
Comparison: Prior exams

CLINICAL DATA: Call back from screening for possible masses in each
breast on the left noted on the MLO view and on the right on the CC
view.

EXAM:
DIGITAL DIAGNOSTIC  BILATERAL MAMMOGRAM WITH CAD
ULTRASOUND RIGHT BREAST

[Series 1: us breast*right* limited inc axilla · 0.08mm/px · 1 of 1 slices shown]
[im 1/1]
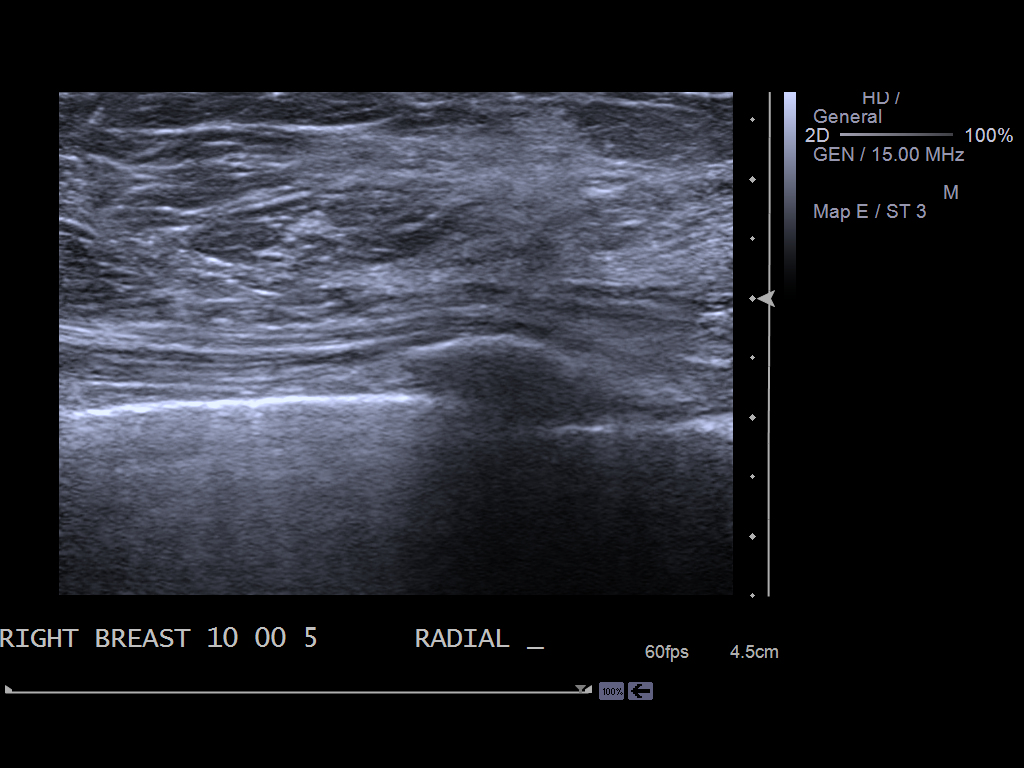

[1 of 1 positions shown; findings below may reference images not displayed]

ACR Breast Density Category c: The breast tissue is heterogeneously
dense, which may obscure small masses.
FINDINGS: On the left, of the possible mass is not seen on the mL view or on
the spot-compression MLO view. This focal opacity on the screening
study is felt to have been due to superimposed fibroglandular
tissue.

On the right, small focal opacity is not evident on the mL view. No
convincing masses seen on the spot compression CC view. This
screening mammogram abnormality was also likely due to superimposed
fibroglandular tissue.

Mammographic images were processed with CAD.

Ultrasound is performed, showing normal fibroglandular tissue
throughout the upper outer and lateral right breast. No mass or
cyst.
IMPRESSION: Normal exam.  No evidence of malignancy.

RECOMMENDATION:
Screening mammogram in one year.(Code:[PK])

I have discussed the findings and recommendations with the patient.
Results were also provided in writing at the conclusion of the
visit. If applicable, a reminder letter will be sent to the patient
regarding the next appointment.

BI-RADS CATEGORY  1: Negative.

## 2014-05-08 IMAGING — MG MM ADDITIONAL VIEWS AT NO CHARGE
5 series · 5 of 5 positions shown · non-contrast
Comparison: Prior exams

CLINICAL DATA: Call back from screening for possible masses in each
breast on the left noted on the MLO view and on the right on the CC
view.

EXAM:
DIGITAL DIAGNOSTIC  BILATERAL MAMMOGRAM WITH CAD
ULTRASOUND RIGHT BREAST

[L MLO (1 of 2)]
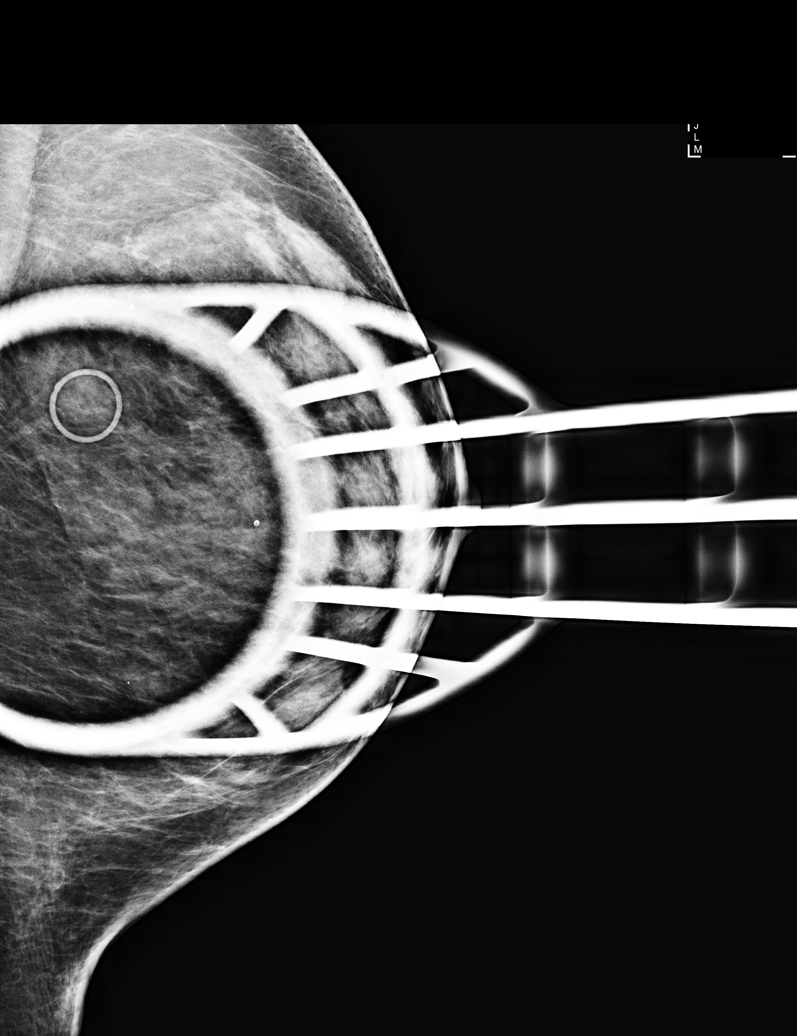

[L MLO (2 of 2)]
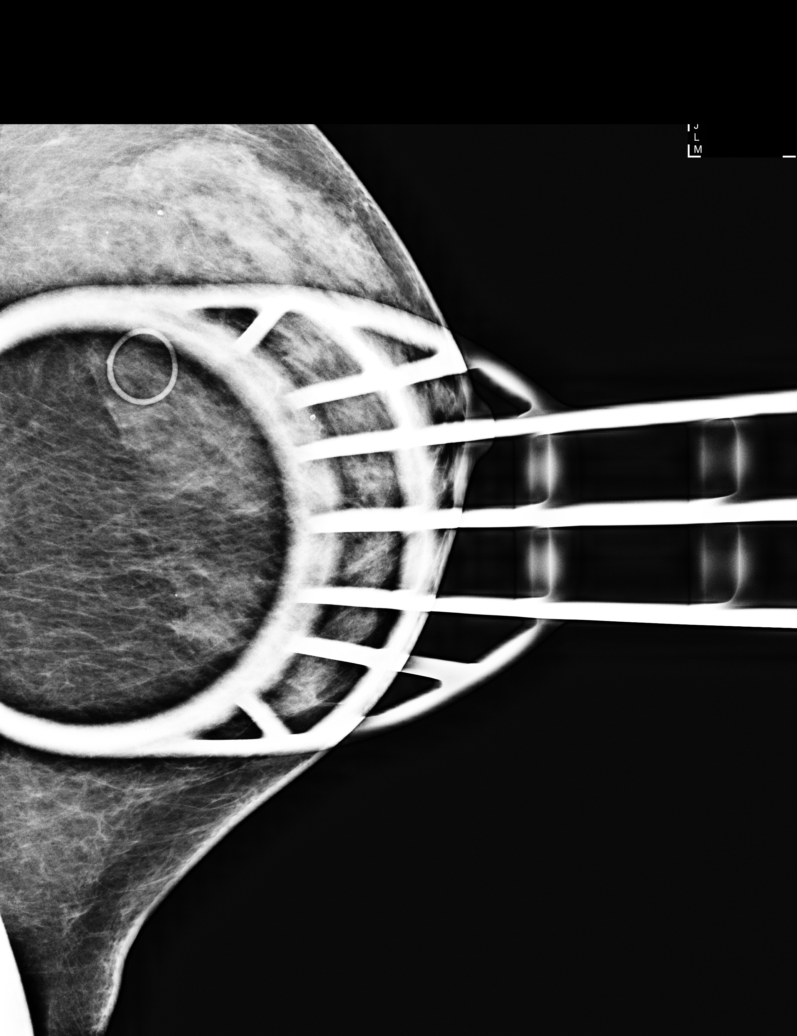

[R ML]
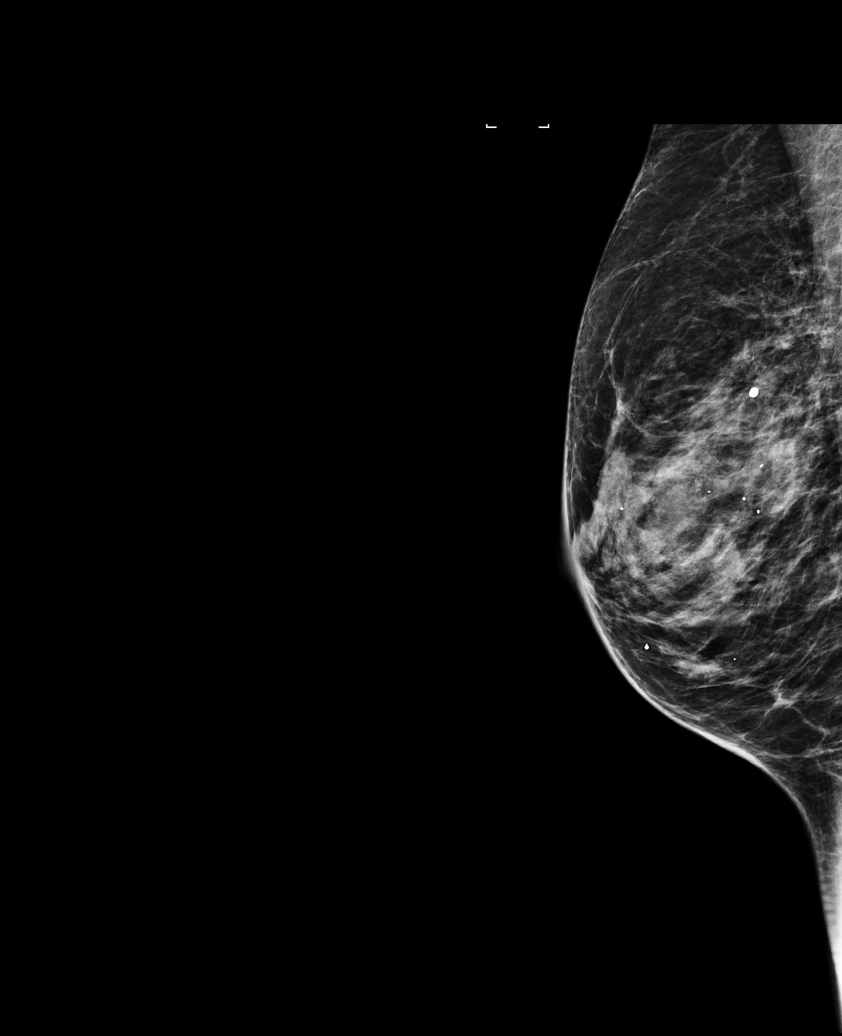

[L ML]
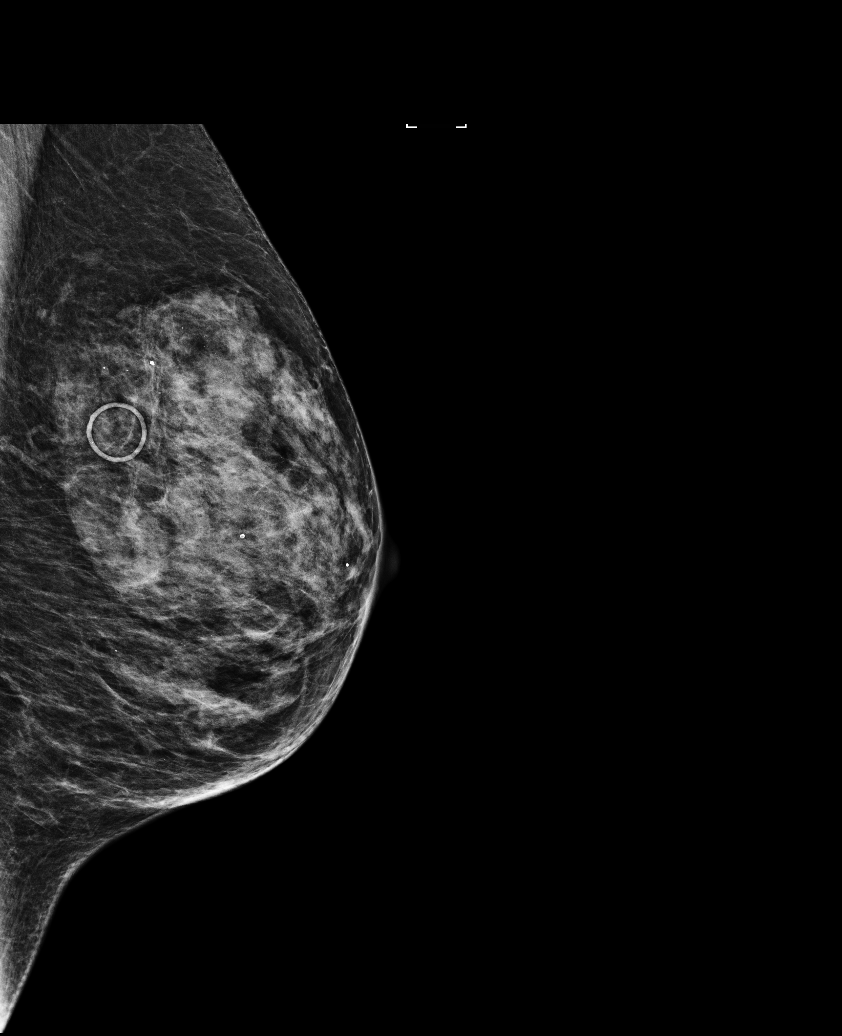

[R CC]
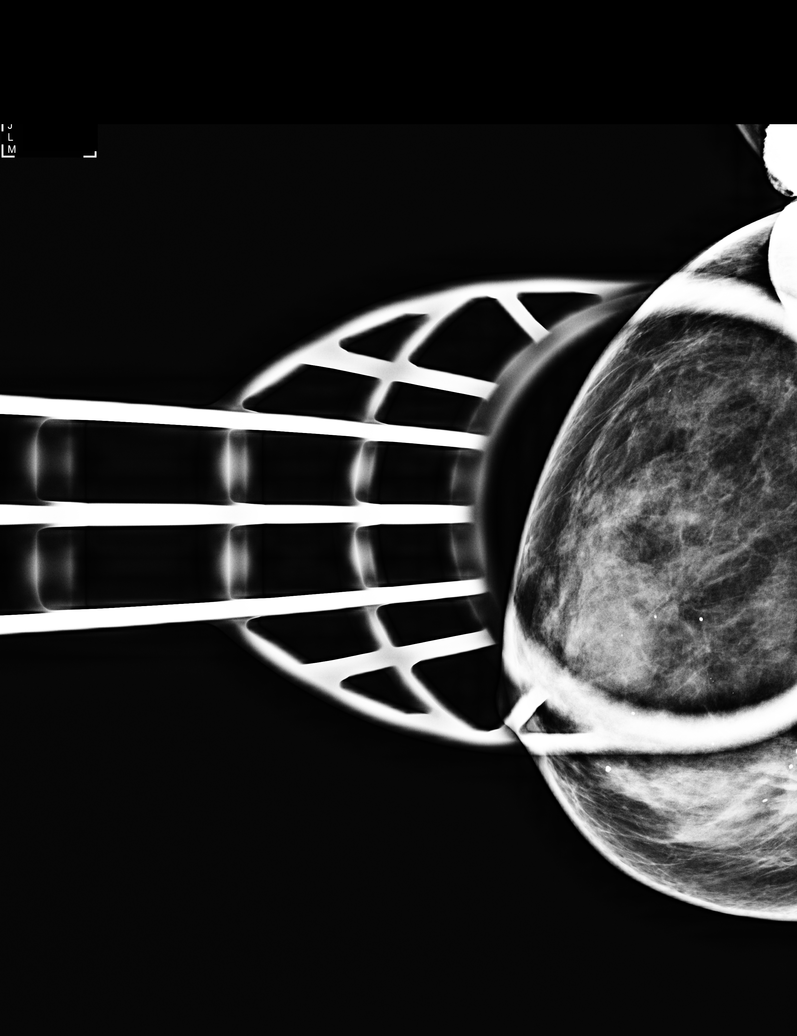

[5 of 5 positions shown; findings below may reference images not displayed]

ACR Breast Density Category c: The breast tissue is heterogeneously
dense, which may obscure small masses.
FINDINGS: On the left, of the possible mass is not seen on the mL view or on
the spot-compression MLO view. This focal opacity on the screening
study is felt to have been due to superimposed fibroglandular
tissue.

On the right, small focal opacity is not evident on the mL view. No
convincing masses seen on the spot compression CC view. This
screening mammogram abnormality was also likely due to superimposed
fibroglandular tissue.

Mammographic images were processed with CAD.

Ultrasound is performed, showing normal fibroglandular tissue
throughout the upper outer and lateral right breast. No mass or
cyst.
IMPRESSION: Normal exam.  No evidence of malignancy.

RECOMMENDATION:
Screening mammogram in one year.(Code:[PK])

I have discussed the findings and recommendations with the patient.
Results were also provided in writing at the conclusion of the
visit. If applicable, a reminder letter will be sent to the patient
regarding the next appointment.

BI-RADS CATEGORY  1: Negative.

## 2014-08-02 NOTE — Op Note (Signed)
PATIENT NAME:  Sarah Li, Sarah Li MR#:  [id number] DATE OF BIRTH:  09/16/1940  DATE OF PROCEDURE:  12/26/2012  PREOPERATIVE DIAGNOSIS:  Left clavicle nonunion.   POSTOPERATIVE DIAGNOSIS:  Left clavicle nonunion.   PROCEDURE:  Left clavicle ORIF.   ANESTHESIA:  General.   SURGEON:  Laurene Footman, M.D.   DESCRIPTION OF PROCEDURE:  The patient was brought to the operating room and after adequate anesthesia was obtained, she was placed in a beach chair position with the shoulder table and spider attachment. After prepping and draping the arm in the usual sterile manner, appropriate patient identification and timeout procedures were completed. An oblique incision was made Langer's lines over the distal end of the clavicles. Skin and subcutaneous tissue was spread and the proximal end of the nonunion exposed. With external rotation of the arm, the distal fragment was exposed. There did appear to be a pseudoarthrosis present with fluid collection between the 2 bones within the capsule. This was debrided, and the fracture was mobilized with a reduction clamp. Near anatomic alignment was obtained. A plate was then chosen from the [employer] anterior plate set with a 7-hole clavicle plate applied. Three proximal screws were placed with checking with C-arm to make sure that there was adequate alignment. First, a nonlocking screw, followed by 2 locking screws, the distal fragment was then reduced with a whirlybird attachment. The 2 most distal 2.7 locking screws were inserted, followed by locking screws in the remaining 2 holes. At this point, the fracture appeared stable, well reduced and the hardware was well fixed. The wounds were thoroughly irrigated. The soft tissue was sutured over the plate to minimize potential for irritation with 2-0 Vicryl, followed by a subcuticular closure with 4-0 Monocryl, followed by Dermabond. Telfa, 4 x 4's and OpSite were applied. The patient was sent to the recovery room in stable  condition with a sling applied.  EBL was 50 mL. There were no complications. No specimen.   IMPLANT: [employer] 7-hole clavicle plate, 2.7 and 3.5 cortical and locking screws applied.    ____________________________ Laurene Footman, MD mjm:dmm D: 12/26/2012 19:38:16 ET T: 12/26/2012 22:48:14 ET JOB#: [id number]  cc: Laurene Footman, MD, <Dictator> Laurene Footman MD ELECTRONICALLY SIGNED 12/27/2012 8:12

## 2014-08-02 NOTE — Discharge Summary (Signed)
PATIENT NAME:  Sarah Li, Sarah Li MR#:  [id number] DATE OF BIRTH:  1941/03/04  DATE OF ADMISSION:  10/18/2012 DATE OF DISCHARGE:  10/21/2012  PRIMARY CARE PHYSICIAN:  Dr. Georgie Chard.   CONSULTING:  Cardiologist, Dr. Humphrey Rolls.  DISCHARGE DIAGNOSES: 1.  Atrial fibrillation with rapid ventricular response.  2.  Chronic obstructive pulmonary disease exacerbation.  3.  Alcohol withdrawal.   HISTORY OF PRESENT ILLNESS: Please see admission history and physical. Briefly, the hospital course by issue: 1.  Atrial fibrillation with rapid ventricular response. The patient was seen by Dr. Humphrey Rolls. She underwent DC cardioversion, successfully converted to sinus rhythm. She is continued on amiodarone 200 mg once a day.  2.  Chronic obstructive pulmonary disease exacerbation: The patient was started on nebulizers and steroid taper.  She was also treated with antibiotics. She will be discharged on a steroid taper as well as albuterol metered dose inhaler. She will follow up with Dr. Doy Hutching to continue to manage this.  3.  Alcohol withdrawal. The patient was started on CIWA protocol, required p.r.n. Ativan. She was educated on issues regarding alcohol abuse.   DISCHARGE MEDICATIONS: 1.  Prednisone 20 mg oral taper.  2.  Aspirin 325 mg once a day.  3.  Calcium carbonate 600 once a day.  4.  Eliquis 5 mg 1 tablet twice a day.  5.  Effexor 75 mg once a day.  6.  Gemfibrozil 600 mg twice a day.  7.  Allopurinol 300 mg once a day.  8.  Metoprolol 100 mg once a day.  9.  Lasix 20 mg once a day.  10.  Azithromycin Z-Pak  11.  Amiodarone 200 mg p.o. once a day.   DISCHARGED FOLLOW-UP:  The patient will follow up with Dr. Juanda Bond on Monday to obtain samples of Eliquis.  She will also follow up with him on Thursday of next week. She will follow up with Dr. Georgie Chard in 1 to 2 weeks.   TIME SPENT: This discharge took 35 minutes   ____________________________ Cheral Marker. Ola Spurr, MD dpf:dp D: 10/21/2012 10:18:01  ET T: 10/21/2012 11:51:05 ET JOB#: [id number]  cc: Cheral Marker. Ola Spurr, MD, <Dictator> [place] Ola Spurr MD ELECTRONICALLY SIGNED 10/25/2012 21:57

## 2014-08-02 NOTE — Consult Note (Signed)
PATIENT NAME:  Sarah Li, Sarah Li MR#:  [id number] DATE OF BIRTH:  10-04-1940  DATE OF CONSULTATION:  10/18/2012  REFERRING PHYSICIAN:  Dr. Vianne Bulls CONSULTING PHYSICIAN:  Merla Riches, PA-C  This is Merla Riches, PA-C dictating for Dr. Neoma Laming.   PRIMARY CARE PHYSICIAN:  Dr. Doy Hutching.   REASON FOR CONSULTATION:  Atrial fibrillation.   HISTORY OF PRESENT ILLNESS:  Mrs. Evelisse Szalkowski is a 74 year old white female.  The patient had recent mid left clavicular fracture.  She was seen on June 15th secondary to tripping over an object at home.  She was given multiple pain medications which she was unable to tolerate and has had nausea, vomiting and hives from several of these medications.  She notes that since last Monday evening she has had increased shortness of breath and early this morning her husband noticed that she had a blue tinge around her lips and hands.  The patient has complained of increased shortness of breath, dyspnea on exertion, orthopnea and has had wheezing with a dry cough.  Last night and this morning she had developed some chest pressure and heaviness, became increasingly shaky and nauseous.  She does not sense any palpitations or rapid heart rate.  Denies any dizziness or syncopal episode.  Of note, the patient has had nausea and had some vomiting yesterday when she tried to take prednisone.  Previous cardiac work-up was done many years ago, states she had a cardiac catheterization in the 1990s with a "blockage behind heart."  This blockage is approximately 50%.  It was a small vessel and no intervention was done.  She had repeat stress test several years ago.   PAST MEDICAL HISTORY: 1.  Recent clavicle fracture.  2.  History of breast cancer status post lumpectomy, follows up with Dr. Oliva Bustard and in clinical remission.  3.  Hypertension.  4.  Hyperlipidemia with hypertriglyceridemia.  5.  Gout.  6.  Possible COPD.   PAST SURGICAL HISTORY: 1.  Hysterectomy.  2.  Breast  lumpectomy.   ALLERGIES:  CODEINE WHICH CAUSED HIVES.   HOME MEDICATIONS: 1.  Allopurinol 300 mg by mouth daily.  2.  Aspirin 325 mg by mouth daily.  3.  Calcium carbonate 600 mg by mouth daily.  4.  Effexor 75 mg by mouth daily.  5.  Furosemide 20 mg by mouth daily.  6.  Gemfibrozil 600 mg by mouth twice daily.  7.  Metoprolol 100 mg by mouth daily.  8.  Prednisone taper, started two days ago by Dr. Doy Hutching.   SOCIAL HISTORY:  The patient is a previous smoker, has 40 pack-year history.  She drinks approximately one glass of red wine per day, denies any caffeine use, denies illicit drug use.   FAMILY HISTORY:  Sisters with breast cancer/bone cancer.  Multiple family members with coronary artery disease, CABG and stenting including two sisters, two brothers, and her father.    REVIEW OF SYSTEMS:  CONSTITUTIONAL:  The patient complains of feeling somewhat weak.  Denies any fever.  EYES:  The patient denies any blurred vision.  EARS, NOSE, THROAT:  The patient denies any tinnitus, epistaxis.  RESPIRATORY:  The patient complains of shortness of breath for the last several days, orthopnea, dry cough, wheezing.  CARDIOVASCULAR:  The patient has heaviness in the chest with the shortness of breath.  GASTROINTESTINAL:  The patient has had nausea and vomiting, denies any significant abdominal pain.  SKIN:  The patient had skin rash after starting Percocet which she  has since discontinued.   PHYSICAL EXAMINATION: GENERAL:  This is an elderly female who is not in acute distress.  She is alert and oriented x 3.  VITAL SIGNS:  With a temperature of 98.5 degrees Fahrenheit, heart rate is 125, respiratory rate is 20, blood pressure 129/93, O2 sat is 94% on room air.  HEENT:  Head atraumatic, normocephalic.  Eyes, pupils are equal.  Conjunctivae are pale, pink.  Ears and nose are normal to external inspection.  Mouth, moist mucous membranes, good dentition.  NECK:  Supple.  Trachea is midline.  There is  no JVD.  No carotid bruits.  LUNGS:  Clear to auscultation with diminished breath sounds bilaterally.  No adventitious breath sounds appreciated.  No accessory muscle use, the patient is currently on nasal cannula at 2 L/min.  CARDIOVASCULAR:  Irregularly irregular rate, which is tachycardic and heart monitor shows heart rate varying between 130 to 140s.  ABDOMEN:  Nondistended.  Bowel sounds are present in all four quadrants.  There is no significant tenderness on examination.  EXTREMITIES:  No cyanosis, clubbing or edema.   ANCILLARY DATA:  EKG on admission with atrial fibrillation with slow ventricular response, heart rate of 56 beats per minute.   Chest x-ray from 10/18/2012, interstitial prominence.  Lung V/Q scan 10/18/2012 with low probability ventilation/perfusion study for pulmonary embolic disease, findings which could  represent a component of emphysematous changes.   LABORATORY DATA:  BNP 7979, glucose 140, BUN 21, creatinine 1.20, sodium 137, potassium 3.9, chloride 103, CO2 is 22, estimated GFR is 45, total protein 7.6, albumin is 3.8, total bilirubin is 0.7, alkaline phosphatase 96, AST is 82, ALT is 37.  CK is 61, CK-MB 2.1.  Troponin I is less than 0.02.  TSH is 10.9.  White blood cell count is 14.5, hemoglobin is 11.8, hematocrit 35.7, platelet count 439,000.  D-dimer is 1.72.  Blood cultures were drawn and are pending, no growth to date.   Echocardiogram, normal left ventricular ejection fraction of 60% to 65%, decreased left ventricular internal cavity size, mild mitral valve regurgitation, mildly elevated pulmonary artery systolic pressures, mild tricuspid regurgitation.    ASSESSMENT AND PLAN: 1.  Atrial fibrillation, now with rapid ventricular response.  The patient has new onset atrial fibrillation, has associated shortness of breath.  She was given diltiazem previously and was on metoprolol at home.  We will discontinue/hold diltiazem and metoprolol at this time and start  amiodarone bolus and IV drip.  Her CHADS 2 score is 1, we will decrease her aspirin 81 and start anticoagulation with Eliquis 5 mg by mouth twice daily.  Her echocardiogram does not show any evidence of thrombi.    2.  Shortness of breath.  The patient's shortness of breath could be secondary to chronic obstructive pulmonary disease/emphysema as there is no acute evidence of any pulmonary embolism.  Certainly atrial fibrillation could be contributing to these symptoms.  Her left ventricular ejection fraction is normal on her echocardiogram as noted above.  3.  Chest pain.  Chest pain could be secondary to pulmonary etiology, atrial fibrillation, but however we will also need to rule out coronary artery disease.  Agree with cycling cardiac enzymes.  There is normal wall motion on her echocardiogram at this time.   Thank you very much for this consultation and allowing [place] to participate in this patient's care.  We will continue to follow this patient with you.    ____________________________ Merla Riches, PA-C mam:ea D: 10/18/2012 17:29:45  ET T: 10/18/2012 18:52:50 ET JOB#: [id number]  cc: Merla Riches, PA-C, <Dictator> Leonie Douglas. Doy Hutching, MD Merla Riches, PA-C Olivia Royse A [facility] ELECTRONICALLY SIGNED 10/19/2012 9:51

## 2014-08-02 NOTE — Consult Note (Signed)
Brief Consult Note: Diagnosis: A fib.   Patient was seen by consultant.   Consult note dictated.   Recommend further assessment or treatment.   Orders entered.   Comments: Patient with increased SOB, found to have a fib and CHADS 2 score of 1. Patient with associated chest pressure, had pos. D-dimer, but v/q scan low probability for PE.Will place on Amiodarone gtt to chemically convert, reduce ASA to '81mg'$ , start anticoagulation. SOB may be combination of pulmonary etiology, afib. CP may be from a fib, will cycle CE and will also need to r/o CAD. Will follow with you.  Electronic Signatures: Angelica Ran (MD)   (Signed 11-Jul-14 08:42)  Co-Signer: Brief Consult Note Merla Riches (PA-C)   (Signed 09-Jul-14 17:18)  Authored: Brief Consult Note  Last Updated: 11-Jul-14 08:42 by Angelica Ran (MD)

## 2014-08-02 NOTE — H&P (Signed)
PATIENT NAME:  Sarah Li, Sarah Li MR#:  [id number] DATE OF BIRTH:  03-27-1941  DATE OF ADMISSION:  10/18/2012  PATIENT'S  PRIMARY DOCTOR: Dr. Doy Hutching.   ER PHYSICIAN: Dr. Jasmine December.   CHIEF COMPLAINT: Shortness of breath.   HISTORY OF PRESENT ILLNESS: A 74 year old female patient who recently suffered a mid-left clavicular fracture on June 15th. Has been taking pain medications. INITIALLY SHE WAS GIVEN  PERCOCET IN THE EMERGENCY ROOM ON JUNE 15TH. LATER ON, THAT GAVE HER VOMITING, so the patient's went to see Dr. Doy Hutching, who suggested the tramadol 2 days ago. The patient still has pain in the clavicle on the left side. The patient sees Dr. Eliberto Ivory for that, but the patient noticed shortness of breath getting gradually worse since the last 2 days, so she thought it could be a reaction to the  tramadol and codeine, and the patient's shortness of breath was so bad with chest heaviness, so the family called the ambulance. By EMS, O2 sats were like around 80% on room air. The patient was also found to be having atrial fibrillation with RVR. The patient received Cardizem 25 mg IV  and after getting Cardizem 25, heart rate dropped to around 49 to 50s, and the patient was brought into the Emergency Room. In the ER, O2 sats were 98% on 100% non-rebreather. The patient was wheezing, and ER physician gave her a dose of Decadron and started on BiPAP. The patient's breathing is  improved,  patient is going to be admitted for a COPD exacerbation and pneumonia.   PAST MEDICAL HISTORY:  1.  Significant for a history of recent clavicle fracture; seeing Dr. Eliberto Ivory for that. The patient is on tramadol for the pain control.  2. History of breast cancer, status post lumpectomy, in remission now, and the patient's breast cancer treatment was 10 years ago and follows up with Dr. Oliva Bustard.  3.   History of hypertension and hyperlipidemia.   ALLERGIES: TO CODEINE.  SOCIAL HISTORY: Previous smoker, heavy, 40 pack-years. I guess no  alcohol, no drugs.   PAST SURGICAL HISTORY: Significant for hysterectomy and lumpectomy.   FAMILY HISTORY: Significant for her sisters have breast cancer, bone cancer, and also spleen  cancer runs in the family.   MEDICATIONS: 1.  Allopurinol 300 mg p.o. daily.  2.  Aspirin 325 mg p.o. daily.  3.  Calcium carbonate 600 mg p.o. daily.  4.  Effexor 75 mg p.o. daily.  5.  Furosemide 20 mg p.o. daily.  6.  Gemfibrozil 600 mg p.o. b.i.d.  7.  Metoprolol 100 mg p.o. daily.  8. Prednisone: She was just started on prednisone 2 days ago by Dr. Doy Hutching for POSSIBLE ALLERGIC REACTION TO CODEINE, and the patient is on a tapering dose of prednisone.   REVIEW OF SYSTEMS: CONSTITUTIONAL: Denies any fever or fatigue.  EYES: No blurred vision. The patient denies any glaucoma or cataracts.  ENT: No tinnitus. No ear pain. No epistaxis. No difficulty swallowing.  RESPIRATORY: Has some shortness of breath for the last 2 days, and also mild cough and wheezing. The patient denies any history of hemoptysis.  CARDIOVASCULAR: Feels heavy in the chest because of shortness of breath and unable to take deep breaths. The patient denies any orthopnea or pedal edema. No palpitations.  GASTROINTESTINAL: Does feel nauseous since this morning.  GENITOURINARY: No dysuria.  ENDOCRINE: The patient denies any thyroid problems. No heat or cold intolerance.  INTEGUMENTARY: THE PATIENT DID DEVELOP SKIN RASH AFTER STARTING PERCOCET, AND THEN SHE  DISCONTINUED THAT.  MUSCULOSKELETAL: Occasional joint pain.  NEUROLOGIC: Denies any dysarthria. No tremors. No vertigo. PSYCHIATRIC: Denies any anxiety or depression.   PHYSICAL EXAMINATION: GENERAL: This is a well-nourished 74 year old female not in distress at this time.  VITALS: Blood pressure 140/74, pulse is around 100 to 120. She is in atrial fibrillation. The patient's temperature is 98.2. Sats are around 98% right now on 2 liters.  HEENT: PERRLA. EOM are intact. The patient has  no conjunctivitis. No difficulty hearing.  Tympanic membranes are clear. Mucous membranes are dry. Dentition is good.  NECK: Her thyroid is nontender, supple. No masses. No lymphadenopathy. No JVD. No carotid bruit.  RESPIRATIONS: The patient has some expiratory wheezing in the right and left lung fields. Not using accessory muscles of respiration.  CARDIOVASCULAR: The patient has S1, S2, irregularly irregular, and no extremity edema. Good pedal pulses.  ABDOMEN: Soft, nontender, nondistended. Bowel sounds present. No hernias.  MUSCULOSKELETAL: Strength 5/5 in the upper and lower extremities.  SKIN: Does have some erythematous rash  in both hands, which is dried.  LYPHATIC: No lymphadenopathy.  NEUROLOGIC: Cranial nerves II through XII intact. DTRs 2+ bilaterally. The patient has no dysphagia or aphasia.  PSYCHIATRIC: Oriented to time, place, person.   LAB DATA: Chest x-ray shows some interstitial prominence. No focal region of consolidation. Differential consideration will be new infection versus inflammation. Underlying component of non-cardiogenic pulmonary edema is also a diagnosis.   Magnesium is 2, TSH is 10.9. Troponin less than 0.02. Electrolytes: Sodium 137, potassium 3.9, chloride 103, bicarbonate 22, BUN 21, creatinine 1.20, glucose 140. LFTs within normal limits. CBC: WBC 14.  hemoglobin 11.8, hematocrit 35.7, platelets 439. D-dimer is 1.72. BNP 7979. EKG showing atrial fibrillation with RVR. Initially EKG showed 59 beats per minute on the monitor, but during my visit it was like heart rate is around 100 to 120, and the patient is in atrial fibrillation.   ASSESSMENT AND PLAN:  49.  A 74 year old female patient with a sudden onset of shortness of breath and tachycardia, with elevated d-dimer. Evaluate for pulmonary emboli. V/Q scan is ordered. I started her on Lovenox, full-dose.  2.  Acute respiratory failure, likely due to chronic obstructive pulmonary disease exacerbation and also  pneumonia. The patient has some wheezing. Initially, the wheezing was pretty bad according to the emergency room physician. So, we are going to continue intravenous Solu-Medrol, DuoNebs.  continue the Levaquin.  The patient is also on Zithromax. Continue O2 to keep sats around 95%.  3.  Atrial fibrillation, new-onset, probably secondary to her respiratory status. Ordered the echocardiogram. Continue metoprolol 50 twice daily, and also Cardizem 10 mg IV q.6 hours, and keep her on telemetry. Consult cardiology.  4.  Hypothyroidism. Continue Synthroid.  5.  History of clavicular fracture, and also the patient is having pain. She requested tramadol, so I restarted the tramadol.  6.  History of hyperlipidemia. She is on gemfibrozil. Will continue that.  7.  History of depression. She is on Effexor. Continue that.  TIME SPENT ON HISTORY AND PHYSICAL: About 60 minutes. This is a critical history and physical.    ____________________________ Epifanio Lesches, MD sk:dm D: 10/18/2012 12:15:00 ET T: 10/18/2012 13:11:12 ET JOB#: [id number]  cc: Leonie Douglas. Doy Hutching, MD Epifanio Lesches, MD, <Dictator>   Epifanio Lesches MD ELECTRONICALLY SIGNED 11/06/2012 18:37

## 2015-03-29 ENCOUNTER — Emergency Department: Payer: Medicare Other

## 2015-03-29 ENCOUNTER — Encounter: Payer: Self-pay | Admitting: Emergency Medicine

## 2015-03-29 ENCOUNTER — Emergency Department
Admission: EM | Admit: 2015-03-29 | Discharge: 2015-03-29 | Disposition: A | Discharge status: Home / Self Care | Payer: Medicare Other | Attending: Student | Admitting: Student

## 2015-03-29 DIAGNOSIS — E785 Hyperlipidemia, unspecified: Secondary | ICD-10-CM | POA: Diagnosis not present

## 2015-03-29 DIAGNOSIS — W01198A Fall on same level from slipping, tripping and stumbling with subsequent striking against other object, initial encounter: Secondary | ICD-10-CM | POA: Insufficient documentation

## 2015-03-29 DIAGNOSIS — S62637A Displaced fracture of distal phalanx of left little finger, initial encounter for closed fracture: Secondary | ICD-10-CM | POA: Diagnosis not present

## 2015-03-29 DIAGNOSIS — Y998 Other external cause status: Secondary | ICD-10-CM | POA: Diagnosis not present

## 2015-03-29 DIAGNOSIS — S0990XA Unspecified injury of head, initial encounter: Secondary | ICD-10-CM | POA: Insufficient documentation

## 2015-03-29 DIAGNOSIS — Y9389 Activity, other specified: Secondary | ICD-10-CM | POA: Diagnosis not present

## 2015-03-29 DIAGNOSIS — I10 Essential (primary) hypertension: Secondary | ICD-10-CM | POA: Diagnosis not present

## 2015-03-29 DIAGNOSIS — Y9289 Other specified places as the place of occurrence of the external cause: Secondary | ICD-10-CM | POA: Insufficient documentation

## 2015-03-29 DIAGNOSIS — S6992XA Unspecified injury of left wrist, hand and finger(s), initial encounter: Secondary | ICD-10-CM | POA: Diagnosis present

## 2015-03-29 DIAGNOSIS — S62609A Fracture of unspecified phalanx of unspecified finger, initial encounter for closed fracture: Secondary | ICD-10-CM

## 2015-03-29 DIAGNOSIS — F329 Major depressive disorder, single episode, unspecified: Secondary | ICD-10-CM | POA: Insufficient documentation

## 2015-03-29 HISTORY — DX: Unspecified atrial fibrillation: I48.91

## 2015-03-29 HISTORY — DX: Major depressive disorder, single episode, unspecified: F32.9

## 2015-03-29 HISTORY — DX: Hyperlipidemia, unspecified: E78.5

## 2015-03-29 HISTORY — DX: Gastro-esophageal reflux disease without esophagitis: K21.9

## 2015-03-29 HISTORY — DX: Depression, unspecified: F32.A

## 2015-03-29 HISTORY — DX: Gout, unspecified: M10.9

## 2015-03-29 IMAGING — CT CT HEAD W/O CM
1 series · 16 of 30 positions shown, 20 images · non-contrast
Comparison: None.

CLINICAL DATA: Headache after fall yesterday. On chronic
anticoagulation. Initial encounter.

EXAM:
CT HEAD WITHOUT CONTRAST
TECHNIQUE: Contiguous axial images were obtained from the base of the skull
through the vertex without intravenous contrast.

[Series 2: head wo · axial · 0.41mm/px · z∈[+1147,+1273]mm · 16 of 32 slices shown, 20 images]
[im 2/32  brain]
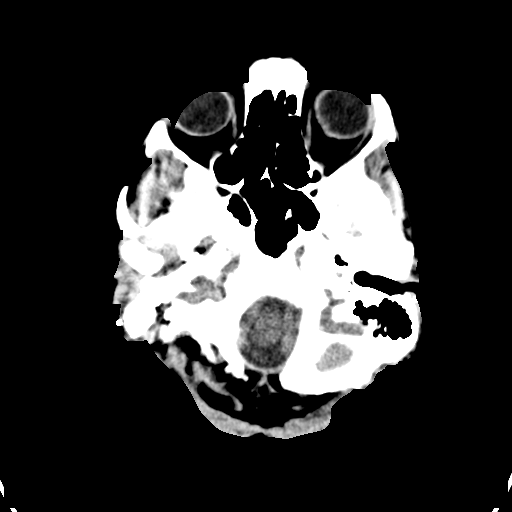
[im 2/32  bone]
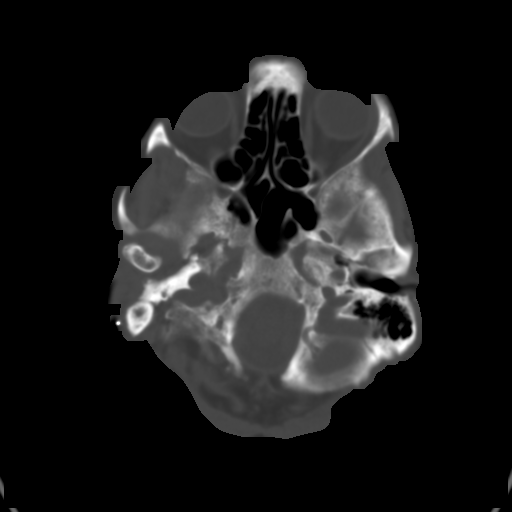
[im 4/32  brain]
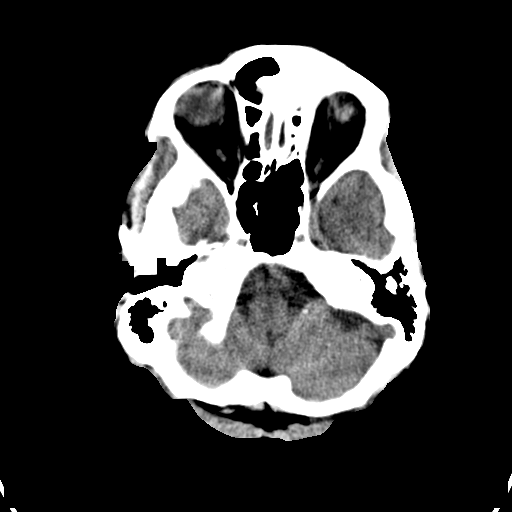
[im 6/32  brain]
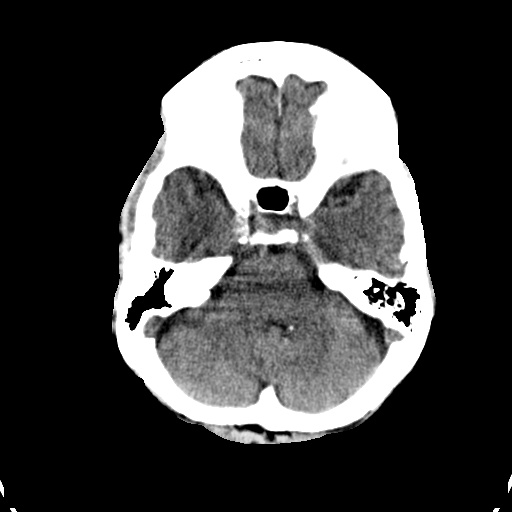
[im 8/32  brain]
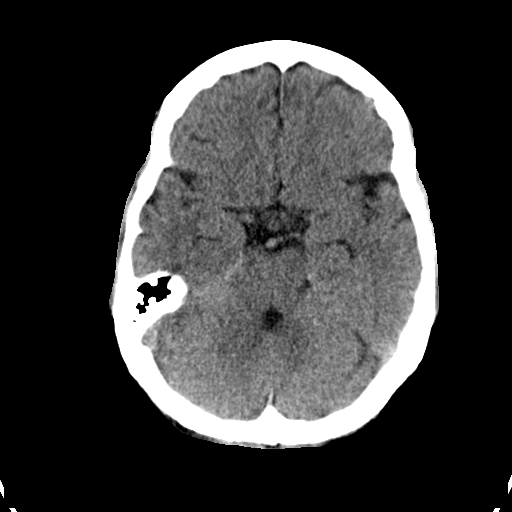
[im 9/32  brain]
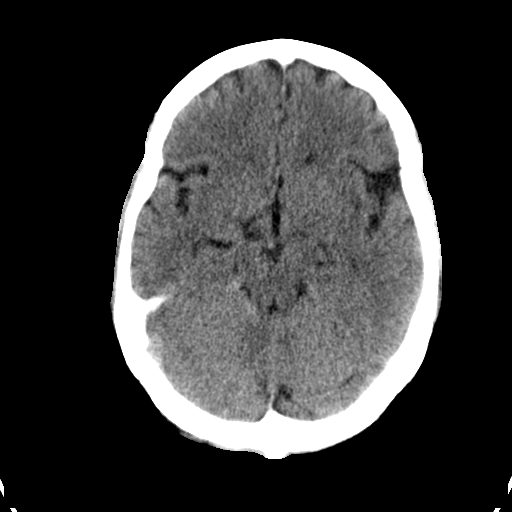
[im 9/32  bone]
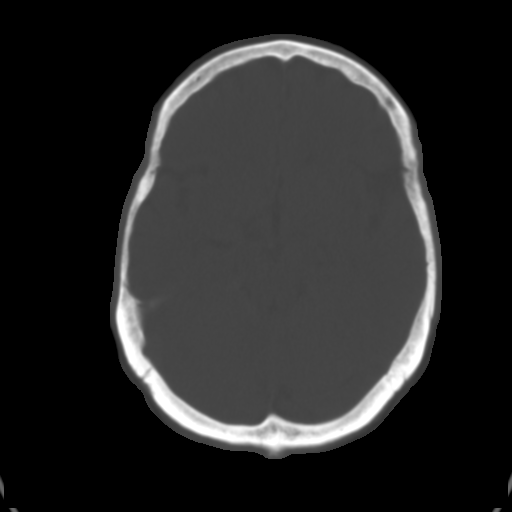
[im 11/32  brain]
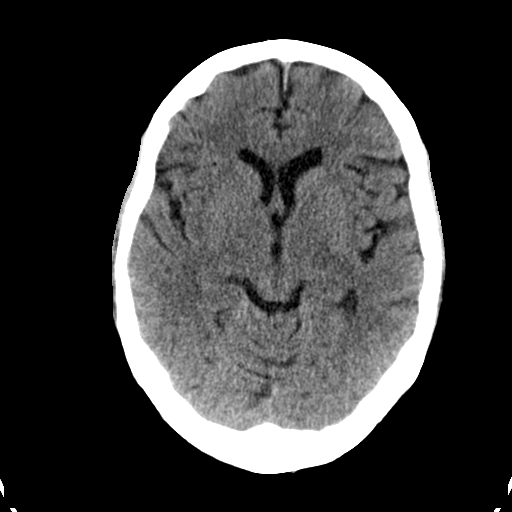
[im 13/32  brain]
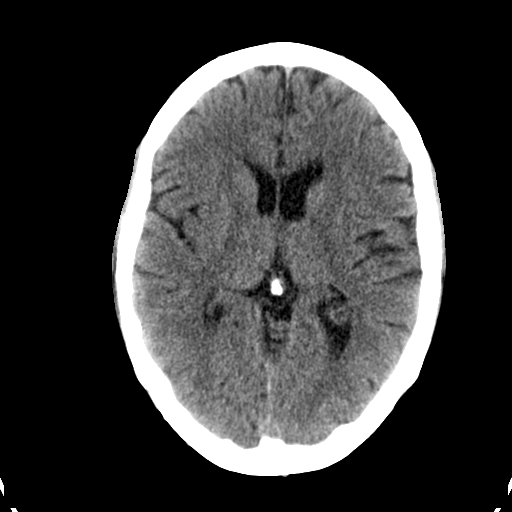
[im 15/32  brain]
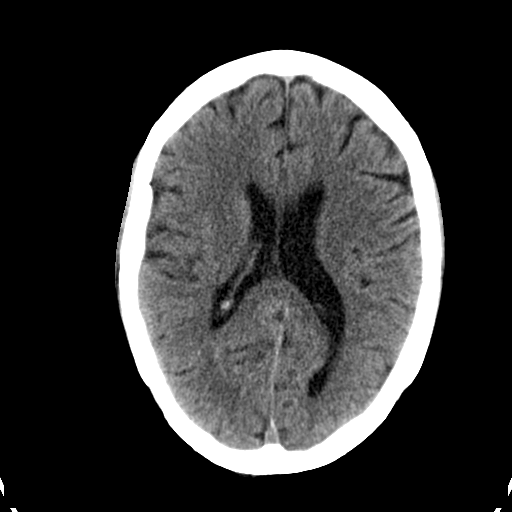
[im 17/32  brain]
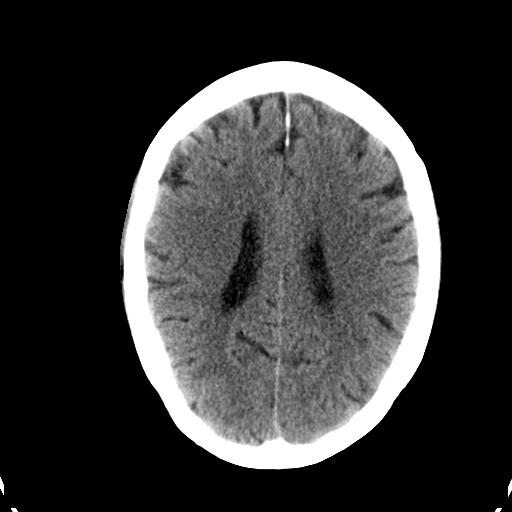
[im 17/32  bone]
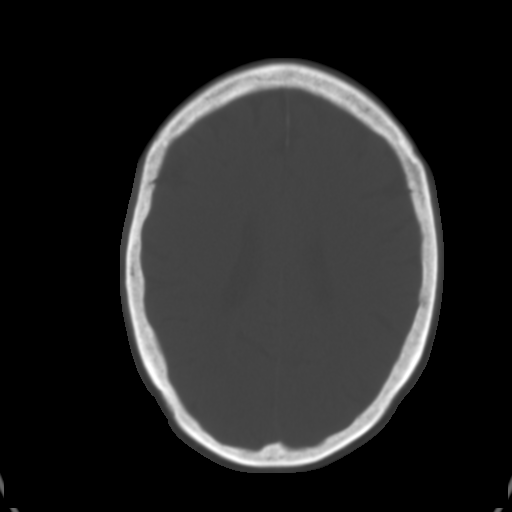
[im 19/32  brain]
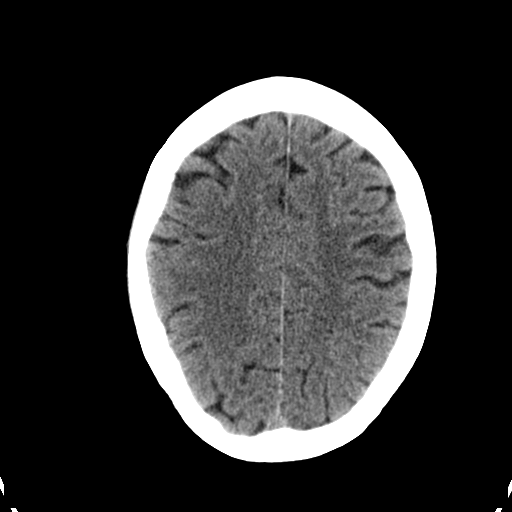
[im 21/32  brain]
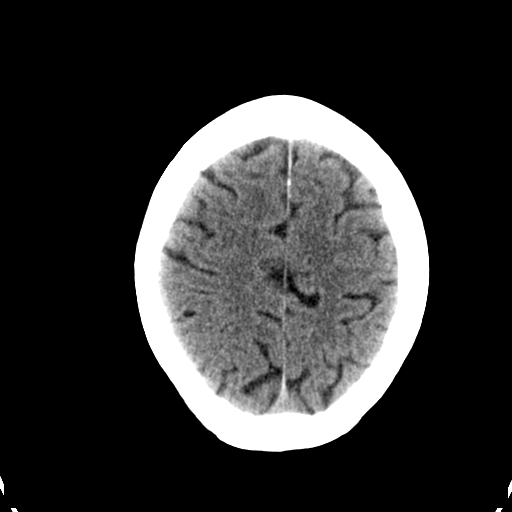
[im 23/32  brain]
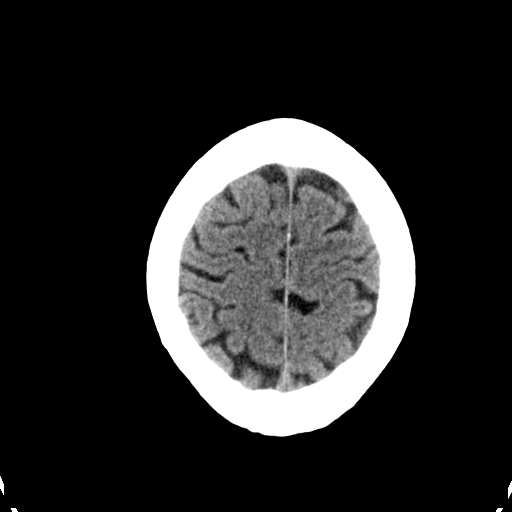
[im 24/32  brain]
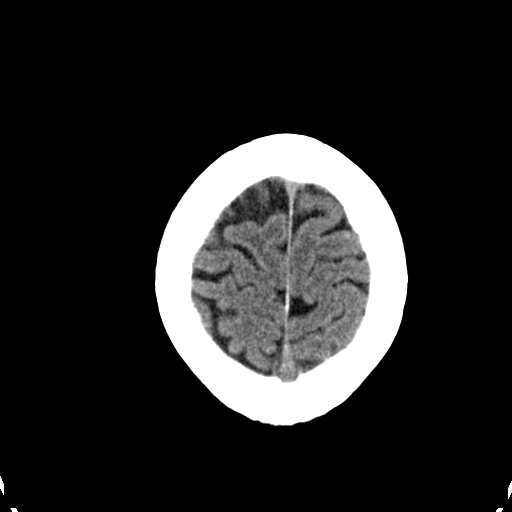
[im 24/32  bone]
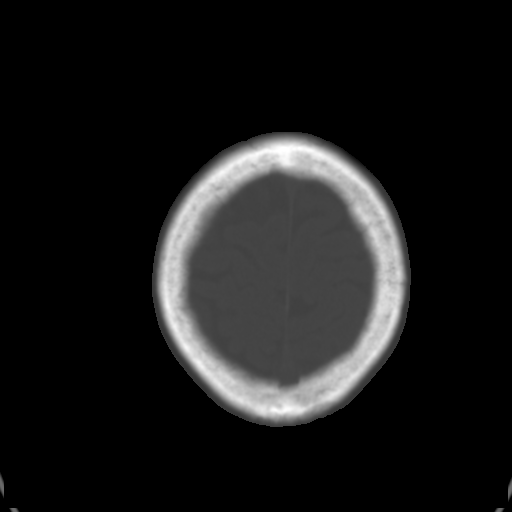
[im 26/32  brain]
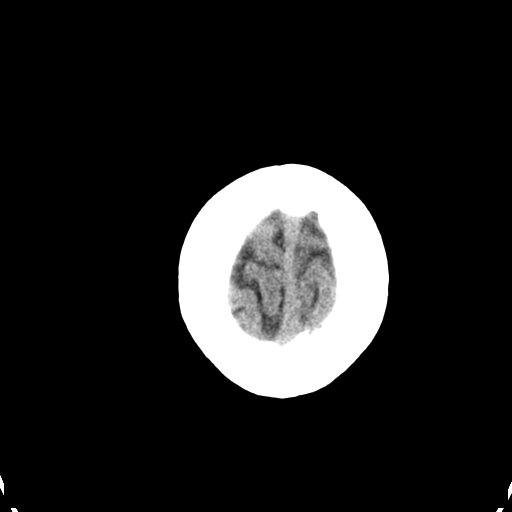
[im 28/32  brain]
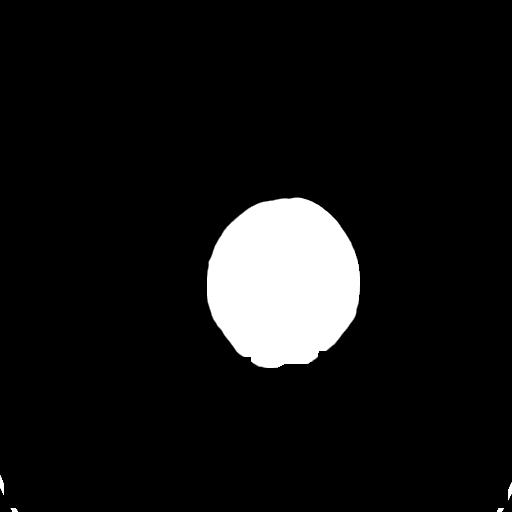
[im 30/32  brain]
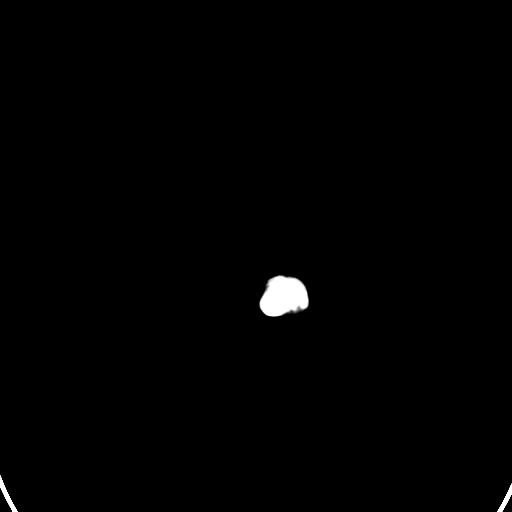

[16 of 30 positions shown; findings below may reference images not displayed]

FINDINGS: The brain demonstrates no evidence of hemorrhage, infarction, edema,
mass effect, extra-axial fluid collection, hydrocephalus or mass
lesion. The skull is unremarkable.
IMPRESSION: Normal head CT.

## 2015-03-29 IMAGING — CR DG FINGER LITTLE 2+V*L*
1 series · 3 of 3 positions shown · non-contrast
Comparison: None.

CLINICAL DATA: 74-year-old female with acute left little finger
pain following fall and injury yesterday. Initial encounter.

EXAM:
LEFT LITTLE FINGER 2+V

[Series 1: pa · 0.17mm/px · 3 of 3 slices shown]
[im 1/3]
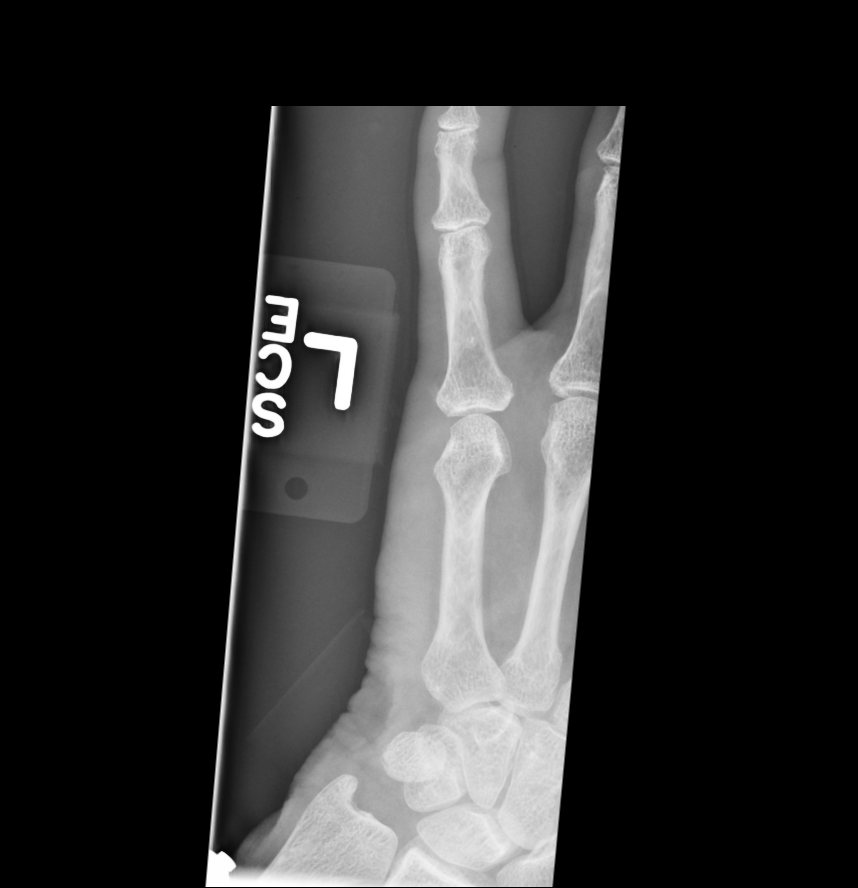
[im 2/3]
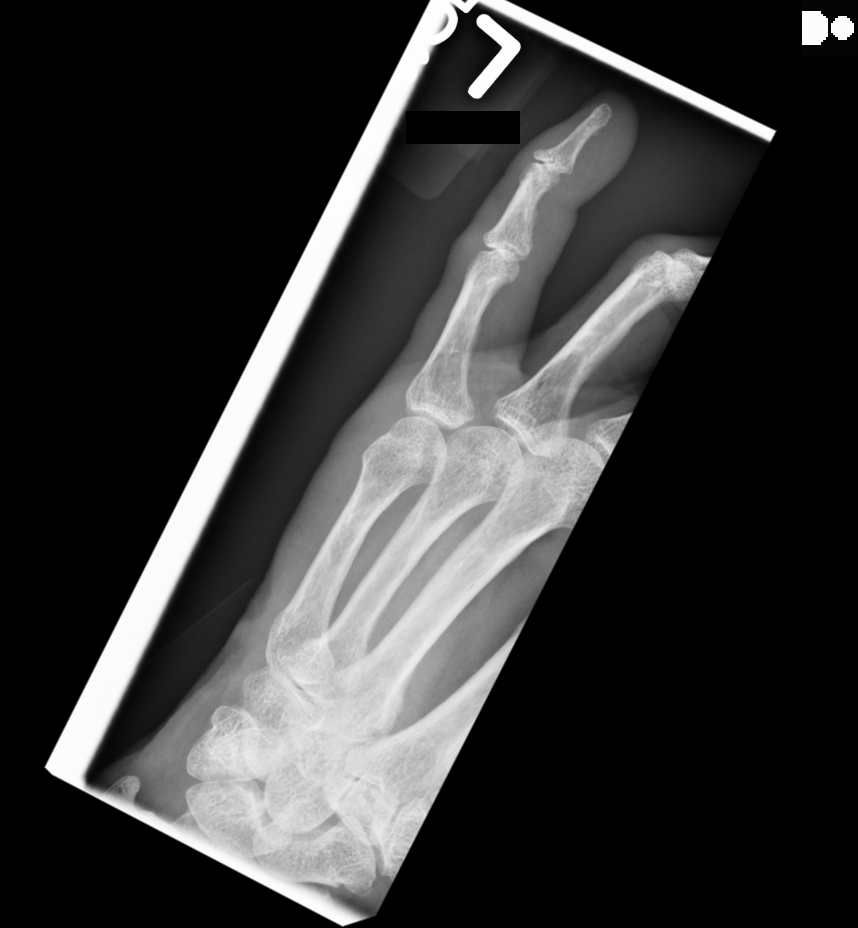
[im 3/3]
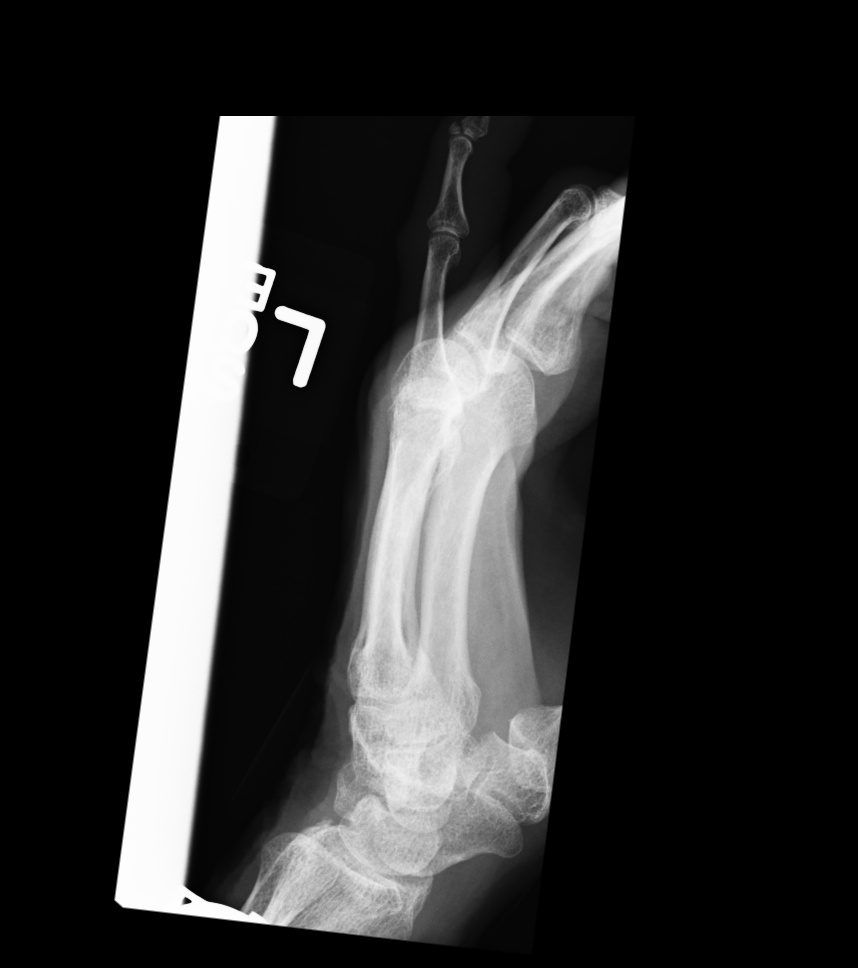

[3 of 3 positions shown; findings below may reference images not displayed]

FINDINGS: An intraarticular fracture of the dorsal base of the distal phalanx
involves 35% of the articular surface with 1 mm distraction of the
fragment.

There is no evidence of subluxation or dislocation.

No other bony abnormalities are identified.
IMPRESSION: Intraarticular fracture at the dorsal base of the distal phalanx as
described.

## 2015-03-29 MED ORDER — OXYCODONE-ACETAMINOPHEN 5-325 MG PO TABS
1.0000 | ORAL_TABLET | Freq: Once | ORAL | Status: AC
  Administered 2015-03-29: 1 via ORAL
  Filled 2015-03-29: qty 1

## 2015-03-29 MED ORDER — TRAMADOL HCL 50 MG PO TABS: 50.0000 mg | ORAL_TABLET | Freq: Four times a day (QID) | ORAL | Status: DC | PRN

## 2015-03-29 NOTE — ED Provider Notes (Signed)
[facility] Emergency Department Provider Note  ____________________________________________  Time seen: Approximately 12:53 PM  I have reviewed the triage vital signs and the nursing notes.   HISTORY  Chief Complaint Fall    HPI Sarah Li is a 74 y.o. female patient complained of headache and an left fifth finger pain secondary to fall yesterday. Patient stated there was no loss of consciousness even though she hit her head. Patient is concerned because she is on Eloquis for atrial fibrillation.He rates the pain discomfort as a 6/10. Patient denies vertigo or weakness. No palliative measures taken for this complaint.   Past Medical History  Diagnosis Date  . Atrial fibrillation ([place])   . Hyperlipidemia   . Gout   . Hypertension   . GERD (gastroesophageal reflux disease)   . Depression     There are no active problems to display for this patient.   History reviewed. No pertinent past surgical history.  No current outpatient prescriptions on file.  Allergies Review of patient's allergies indicates no known allergies.  History reviewed. No pertinent family history.  Social History Social History  Substance Use Topics  . Smoking status: Never Smoker   . Smokeless tobacco: None  . Alcohol Use: No    Review of Systems Constitutional: No fever/chills Eyes: No visual changes. ENT: No sore throat. Cardiovascular: Denies chest pain. Respiratory: Denies shortness of breath. Gastrointestinal: No abdominal pain.  No nausea, no vomiting.  No diarrhea.  No constipation. Genitourinary: Negative for dysuria. Musculoskeletal: Negative for back pain. Skin: Negative for rash. Neurological: Negative for headaches, focal weakness or numbness. Psychiatric:Depression Endocrine:Hyperlipidemia and hypertension. 10-point ROS otherwise negative.  ____________________________________________   PHYSICAL EXAM:  VITAL SIGNS: ED Triage Vitals  Enc  Vitals Group     BP 03/29/15 1019 81/51 mmHg     Pulse Rate 03/29/15 1019 57     Resp 03/29/15 1019 18     Temp 03/29/15 1019 97.6 F (36.4 C)     Temp Source 03/29/15 1019 Oral     SpO2 03/29/15 1019 98 %     Weight 03/29/15 1019 97 lb (43.999 kg)     Height 03/29/15 1019 '5\' 2"'$  (1.575 m)     Head Cir --      Peak Flow --      Pain Score 03/29/15 1045 6     Pain Loc --      Pain Edu? --      Excl. in [place]? --     Constitutional: Alert and oriented. Well appearing and in no acute distress. Eyes: Conjunctivae are normal. PERRL. EOMI. Head: Atraumatic. Nose: No congestion/rhinnorhea. Mouth/Throat: Mucous membranes are moist.  Oropharynx non-erythematous. Neck: No stridor.  No cervical spine tenderness to palpation. Hematological/Lymphatic/Immunilogical: No cervical lymphadenopathy. Cardiovascular: Normal rate, regular rhythm. Grossly normal heart sounds.  Good peripheral circulation. Respiratory: Normal respiratory effort.  No retractions. Lungs CTAB. Gastrointestinal: Soft and nontender. No distention. No abdominal bruits. No CVA tenderness. Musculoskeletal: No obvious deformity to the fifth digit left hand but markedly edema. Decreased range of motion with flexion secondary to complain of pain. Neurovascular intact. Neurologic:  Normal speech and language. No gross focal neurologic deficits are appreciated. No gait instability. Skin:  Skin is warm, dry and intact. No rash noted. Psychiatric: Mood and affect are normal. Speech and behavior are normal.  ____________________________________________   LABS (all labs ordered are listed, but only abnormal results are displayed)  Labs Reviewed - No data to display ____________________________________________  EKG  ____________________________________________  RADIOLOGY  X-ray refill interarticular fracture the base of the fifth distal phalange with approximate 1 mm distraction.  I, Sable Feil, personally viewed and  evaluated these images (plain radiographs) as part of my medical decision making.   __CT scan of head was grossly unremarkable. __________________________________________   PROCEDURES  Procedure(s) performed: None  Critical Care performed: No  ____________________________________________   INITIAL IMPRESSION / ASSESSMENT AND PLAN / ED COURSE  Pertinent labs & imaging results that were available during my care of the patient were reviewed by me and considered in my medical decision making (see chart for details).  Fractured fifth digit hand. The CT x-ray findings with patient. Patient placed in a finger splint and given a prescription for tramadol to take as needed for pain. Patient advised to follow-up with orthopedic clinic on Monday for further evaluation of the finger fracture. ____________________________________________   FINAL CLINICAL IMPRESSION(S) / ED DIAGNOSES  Final diagnoses:  Finger fracture, left, closed, initial encounter      Sable Feil, PA-C 03/29/15 Presho Gayle, MD 04/03/15 [phone]

## 2015-03-29 NOTE — ED Notes (Signed)
Pt to ed with c/o headache after falling yesterday,  Pt states she is on eloquis for a fib.  No external bleeding noted from head at this time.

## 2015-03-29 NOTE — Discharge Instructions (Signed)
Were splinted evaluation by orthopedic clinic. Finger Fracture Finger fractures are breaks in the bones of the fingers. There are many types of fractures. There are also different ways of treating these fractures. Your doctor will talk with you about the best way to treat your fracture. Injury is the main cause of broken fingers. This includes:  Injuries while playing sports.  Workplace injuries.  Falls. HOME CARE  Follow your doctor's instructions for:  Activities.  Exercises.  Physical therapy.  Take medicines only as told by your doctor for pain, discomfort, or fever. GET HELP IF: You have pain or swelling that limits:  The motion of your fingers.  The use of your fingers. GET HELP RIGHT AWAY IF:  You cannot feel your fingers, or your fingers become numb.   This information is not intended to replace advice given to you by your health care provider. Make sure you discuss any questions you have with your health care provider.   Document Released: 09/15/2007 Document Revised: 04/19/2014 Document Reviewed: 11/08/2012 Elsevier Interactive Patient Education 2016 [employer]  Finger Fracture Fractures of fingers are breaks in the bones of the fingers. There are many types of fractures. There are different ways of treating these fractures. Your health care provider will discuss the best way to treat your fracture. CAUSES Traumatic injury is the main cause of broken fingers. These include:  Injuries while playing sports.  Workplace injuries.  Falls. RISK FACTORS Activities that can increase your risk of finger fractures include:  Sports.  Workplace activities that involve machinery.  A condition called osteoporosis, which can make your bones less dense and cause them to fracture more easily. SIGNS AND SYMPTOMS The main symptoms of a broken finger are pain and swelling within 15 minutes after the injury. Other symptoms include:  Bruising of your  finger.  Stiffness of your finger.  Numbness of your finger.  Exposed bones (compound fracture) if the fracture is severe. DIAGNOSIS  The best way to diagnose a broken bone is with X-ray imaging. Additionally, your health care provider will use this X-ray image to evaluate the position of the broken finger bones.  TREATMENT  Finger fractures can be treated with:   Nonreduction--This means the bones are in place. The finger is splinted without changing the positions of the bone pieces. The splint is usually left on for about a week to 10 days. This will depend on your fracture and what your health care provider thinks.  Closed reduction--The bones are put back into position without using surgery. The finger is then splinted.  Open reduction and internal fixation--The fracture site is opened. Then the bone pieces are fixed into place with pins or some type of hardware. This is seldom required. It depends on the severity of the fracture. HOME CARE INSTRUCTIONS   Follow your health care provider's instructions regarding activities, exercises, and physical therapy.  Only take over-the-counter or prescription medicines for pain, discomfort, or fever as directed by your health care provider. SEEK MEDICAL CARE IF: You have pain or swelling that limits the motion or use of your fingers. SEEK IMMEDIATE MEDICAL CARE IF:  Your finger becomes numb. MAKE SURE YOU:   Understand these instructions.  Will watch your condition.  Will get help right away if you are not doing well or get worse.   This information is not intended to replace advice given to you by your health care provider. Make sure you discuss any questions you have with your health care provider.  Document Released: 07/11/2000 Document Revised: 01/17/2013 Document Reviewed: 11/08/2012 Elsevier Interactive Patient Education [employer].

## 2015-05-22 ENCOUNTER — Other Ambulatory Visit: Payer: Self-pay | Admitting: Internal Medicine

## 2015-05-22 DIAGNOSIS — Z1231 Encounter for screening mammogram for malignant neoplasm of breast: Secondary | ICD-10-CM

## 2015-06-04 ENCOUNTER — Ambulatory Visit: Payer: Medicare Other

## 2015-06-06 ENCOUNTER — Other Ambulatory Visit: Payer: Self-pay | Admitting: Internal Medicine

## 2015-06-06 ENCOUNTER — Ambulatory Visit
Admission: RE | Admit: 2015-06-06 | Discharge: 2015-06-06 | Disposition: A | Discharge status: Home / Self Care | Payer: Medicare Other | Source: Ambulatory Visit | Attending: Internal Medicine | Admitting: Internal Medicine

## 2015-06-06 DIAGNOSIS — Z1231 Encounter for screening mammogram for malignant neoplasm of breast: Secondary | ICD-10-CM | POA: Diagnosis present

## 2015-06-06 IMAGING — MG MM SCREENING BREAST TOMO BILATERAL
9 of 12 series · 9 of 28 positions shown · non-contrast
Comparison: Previous exam(s).

CLINICAL DATA: Screening. History of right breast cancer and right
lumpectomy in [SO].

EXAM:
DIGITAL SCREENING BILATERAL MAMMOGRAM WITH 3D TOMO WITH CAD

[R CC synth-2D]
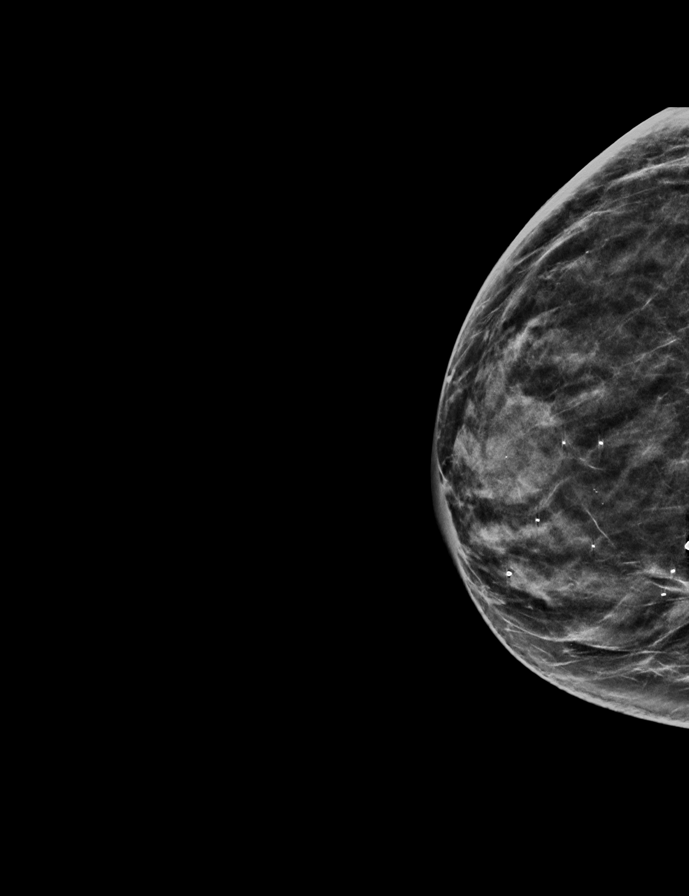

[L MLO synth-2D]
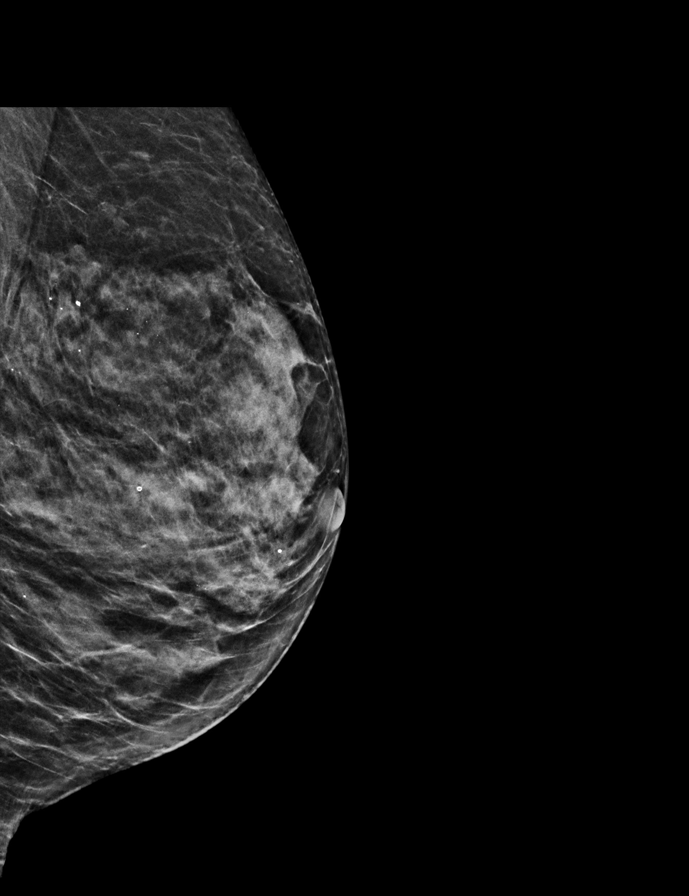

[R CC]
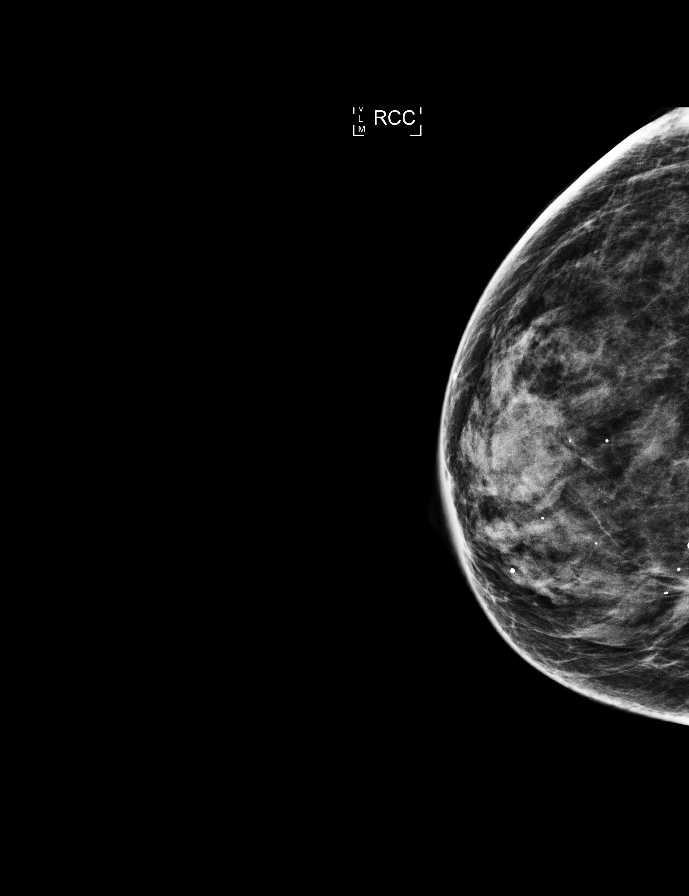

[L MLO]
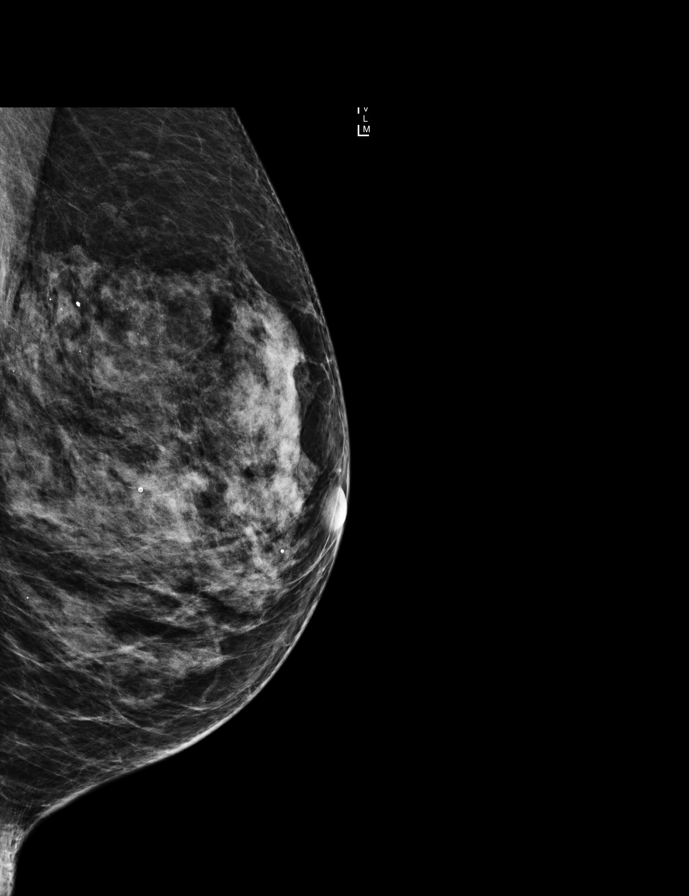

[L CC]
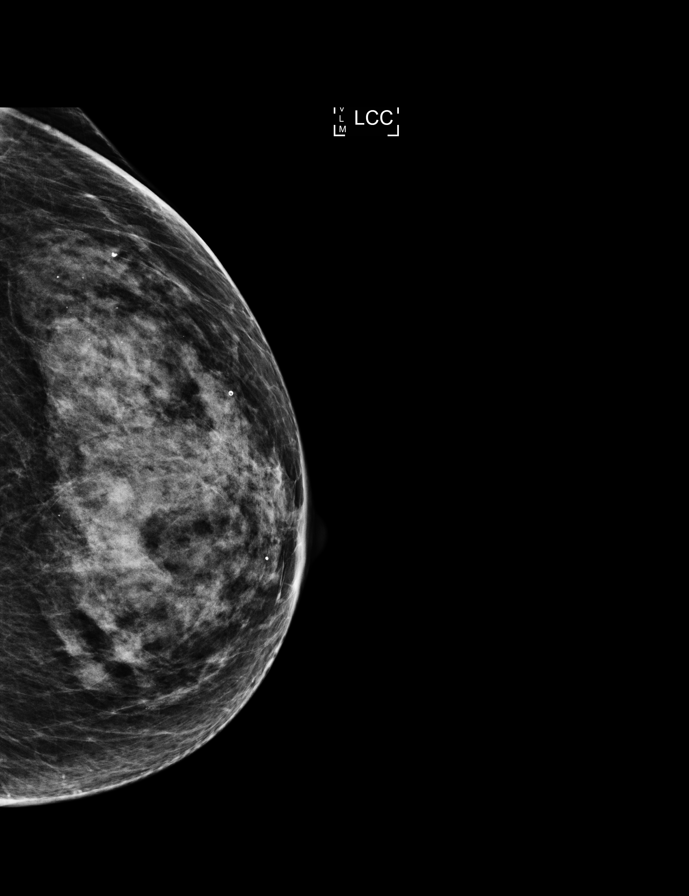

[L CC synth-2D]
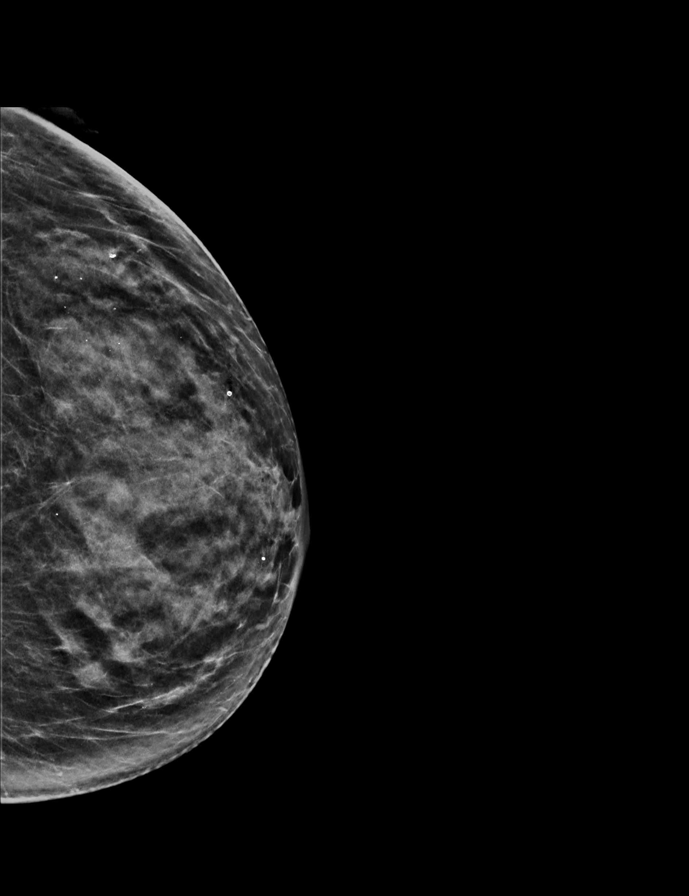

[R MLO synth-2D]
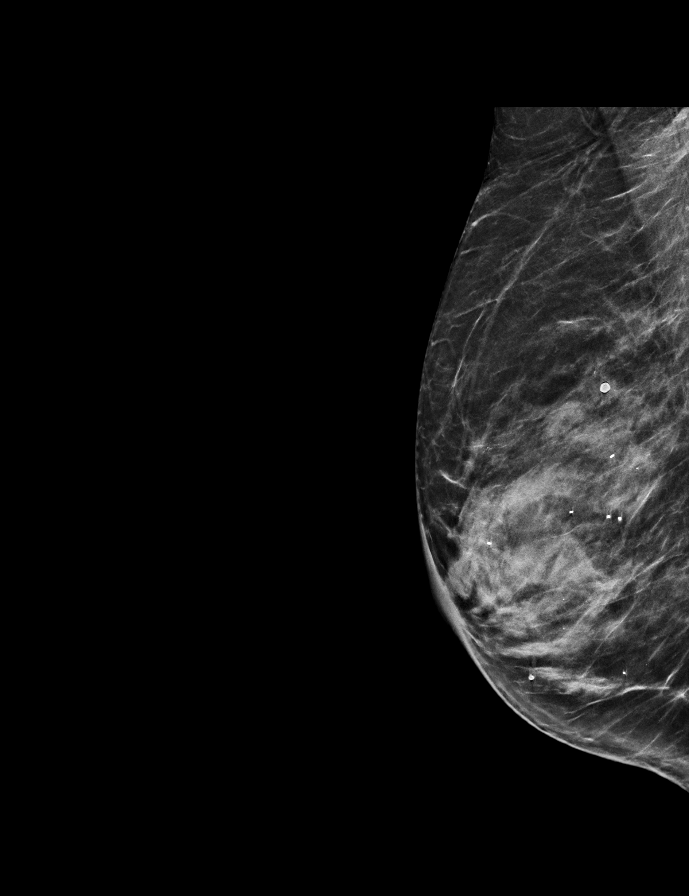

[R MLO]
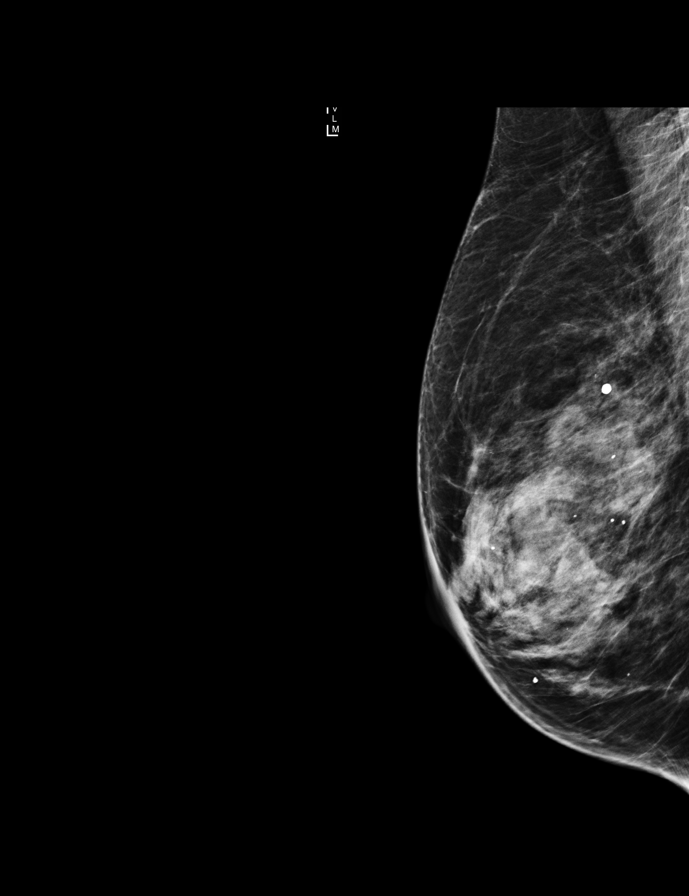

[R CC tomo · tomo slice 26/51.0]
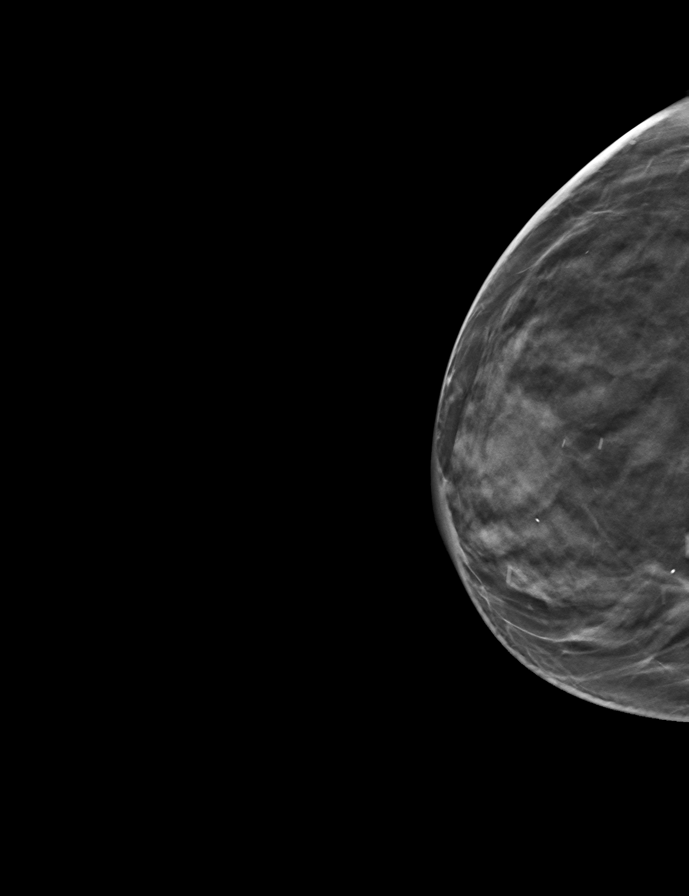

[9 of 28 positions shown; findings below may reference images not displayed]

ACR Breast Density Category c: The breast tissue is heterogeneously
dense, which may obscure small masses.
FINDINGS: There are no findings suspicious for malignancy.

Right breast scarring is again identified.

Images were processed with CAD.
IMPRESSION: No mammographic evidence of malignancy. A result letter of this
screening mammogram will be mailed directly to the patient.

RECOMMENDATION:
Screening mammogram in one year. (Code:[SO])

BI-RADS CATEGORY  2: Benign.

## 2015-11-27 ENCOUNTER — Other Ambulatory Visit: Payer: Self-pay | Admitting: Cardiovascular Disease

## 2015-12-02 ENCOUNTER — Ambulatory Visit: Payer: Medicare Other | Admitting: Anesthesiology

## 2015-12-02 ENCOUNTER — Encounter
Admission: RE | Disposition: A | Discharge status: Home / Self Care | Payer: Self-pay | Source: Ambulatory Visit | Attending: Cardiovascular Disease

## 2015-12-02 ENCOUNTER — Ambulatory Visit
Admission: RE | Admit: 2015-12-02 | Discharge: 2015-12-02 | Disposition: A | Discharge status: Home / Self Care | Payer: Medicare Other | Source: Ambulatory Visit | Attending: Cardiovascular Disease | Admitting: Cardiovascular Disease

## 2015-12-02 ENCOUNTER — Encounter: Payer: Self-pay | Admitting: *Deleted

## 2015-12-02 DIAGNOSIS — E785 Hyperlipidemia, unspecified: Secondary | ICD-10-CM | POA: Diagnosis not present

## 2015-12-02 DIAGNOSIS — F329 Major depressive disorder, single episode, unspecified: Secondary | ICD-10-CM | POA: Insufficient documentation

## 2015-12-02 DIAGNOSIS — I10 Essential (primary) hypertension: Secondary | ICD-10-CM | POA: Diagnosis not present

## 2015-12-02 DIAGNOSIS — I4891 Unspecified atrial fibrillation: Secondary | ICD-10-CM | POA: Diagnosis not present

## 2015-12-02 DIAGNOSIS — K219 Gastro-esophageal reflux disease without esophagitis: Secondary | ICD-10-CM | POA: Diagnosis not present

## 2015-12-02 DIAGNOSIS — M109 Gout, unspecified: Secondary | ICD-10-CM | POA: Diagnosis not present

## 2015-12-02 HISTORY — DX: Atherosclerotic heart disease of native coronary artery without angina pectoris: I25.10

## 2015-12-02 HISTORY — PX: ELECTROPHYSIOLOGIC STUDY: SHX172A

## 2015-12-02 HISTORY — DX: Nonrheumatic mitral (valve) insufficiency: I34.0

## 2015-12-02 HISTORY — DX: Pre-excitation syndrome: I45.6

## 2015-12-02 HISTORY — DX: Malignant neoplasm of unspecified site of unspecified female breast: C50.919

## 2015-12-02 HISTORY — DX: Other specified symptoms and signs involving the circulatory and respiratory systems: R09.89

## 2015-12-02 SURGERY — CARDIOVERSION (CATH LAB)
Anesthesia: General

## 2015-12-02 MED ORDER — MIDAZOLAM HCL 2 MG/2ML IJ SOLN
INTRAMUSCULAR | Status: DC | PRN
  Administered 2015-12-02: 1 mg via INTRAVENOUS

## 2015-12-02 MED ORDER — PROPOFOL 10 MG/ML IV BOLUS
INTRAVENOUS | Status: DC | PRN
  Administered 2015-12-02: 40 mg via INTRAVENOUS

## 2015-12-02 MED ORDER — LIDOCAINE HCL 2 % IJ SOLN
INTRAMUSCULAR | Status: AC
  Filled 2015-12-02: qty 20

## 2015-12-02 MED ORDER — EPHEDRINE SULFATE 50 MG/ML IJ SOLN
INTRAMUSCULAR | Status: DC | PRN
  Administered 2015-12-02 (×2): 10 mg via INTRAVENOUS

## 2015-12-02 MED ORDER — SODIUM CHLORIDE 0.9 % IV SOLN
INTRAVENOUS | Status: DC | PRN
  Administered 2015-12-02: 1000 mL
  Administered 2015-12-02: 08:00:00 via INTRAVENOUS

## 2015-12-02 MED ORDER — CEFAZOLIN IN D5W 1 GM/50ML IV SOLN
INTRAVENOUS | Status: AC
  Filled 2015-12-02: qty 50

## 2015-12-02 MED ORDER — LIDOCAINE HCL (PF) 2 % IJ SOLN
INTRAMUSCULAR | Status: DC | PRN
  Administered 2015-12-02: 40 mg via INTRADERMAL

## 2015-12-02 NOTE — Progress Notes (Signed)
  NAME:  Sarah Li   MRN: [id number] DOB:  October 17, 1940   ADMIT DATE: 12/02/2015  Procedure: Electrical Cardioversion Indications:  Atrial Fibrillation  Procedure Details:    Time Out: Verified patient identification, verified procedure, site/side was marked, verified correct patient position, special equipment/implants available, medications/allergies/relevent history reviewed, required imaging and test results available.    Patient placed on cardiac monitor, pulse oximetry, supplemental oxygen as necessary.  Sedation given:  Pacer pads placed   Cardioverted . 200 J Cardioverted at   Evaluation: Findings: Post procedure EKG shows: Normal sinus rhythm Complications: None Patient did well.     Dionisio David, M.D. [facility]   12/02/2015 8:20 AM

## 2015-12-02 NOTE — Transfer of Care (Signed)
Immediate Anesthesia Transfer of Care Note  Patient: Sarah Li  Procedure(s) Performed: Procedure(s): CARDIOVERSION (N/A)  Patient Location: PACU  Anesthesia Type:General  Level of Consciousness: sedated  Airway & Oxygen Therapy: Patient Spontanous Breathing and Patient connected to nasal cannula oxygen  Post-op Assessment: Report given to RN and Post -op Vital signs reviewed and stable  Post vital signs: Reviewed and stable  Last Vitals:  Vitals:   12/02/15 0743  BP: 112/82  Pulse: 83  Resp: 16    Last Pain: There were no vitals filed for this visit.       Complications: No apparent anesthesia complications

## 2015-12-02 NOTE — Anesthesia Preprocedure Evaluation (Addendum)
Anesthesia Evaluation  Patient identified by MRN, date of birth, ID band Patient awake    Reviewed: Allergy & Precautions, NPO status , Patient's Chart, lab work & pertinent test results, reviewed documented beta blocker date and time   History of Anesthesia Complications Negative for: history of anesthetic complications  Airway Mallampati: I  TM Distance: >3 FB Neck ROM: Full    Dental  (+) Implants   Pulmonary neg pulmonary ROS, neg sleep apnea, neg COPD,    breath sounds clear to auscultation- rhonchi (-) wheezing      Cardiovascular Exercise Tolerance: Good hypertension, Pt. on medications and Pt. on home beta blockers (-) CAD and (-) Past MI + dysrhythmias Atrial Fibrillation  Rhythm:Regular Rate:Normal - Systolic murmurs and - Diastolic murmurs    Neuro/Psych Depression negative neurological ROS     GI/Hepatic Neg liver ROS, GERD  ,  Endo/Other  negative endocrine ROSneg diabetes  Renal/GU negative Renal ROS     Musculoskeletal negative musculoskeletal ROS (+)   Abdominal (+) - obese,   Peds  Hematology negative hematology ROS (+)   Anesthesia Other Findings Past Medical History: No date: Atrial fibrillation (HCC) No date: Depression No date: GERD (gastroesophageal reflux disease) No date: Gout No date: Hyperlipidemia No date: Hypertension   Reproductive/Obstetrics                             Anesthesia Physical Anesthesia Plan  ASA: III  Anesthesia Plan: General   Post-op Pain Management:    Induction: Intravenous  Airway Management Planned: Natural Airway  Additional Equipment:   Intra-op Plan:   Post-operative Plan:   Informed Consent: I have reviewed the patients History and Physical, chart, labs and discussed the procedure including the risks, benefits and alternatives for the proposed anesthesia with the patient or authorized representative who has indicated  his/her understanding and acceptance.   Dental advisory given  Plan Discussed with: Anesthesiologist and CRNA  Anesthesia Plan Comments:         Anesthesia Quick Evaluation

## 2015-12-02 NOTE — Anesthesia Postprocedure Evaluation (Addendum)
Anesthesia Post Note  Patient: Sarah Li  Procedure(s) Performed: Procedure(s) (LRB): CARDIOVERSION (N/A)  Patient location during evaluation: PACU Anesthesia Type: General Level of consciousness: awake and alert and oriented Pain management: pain level controlled Vital Signs Assessment: post-procedure vital signs reviewed and stable Respiratory status: spontaneous breathing, nonlabored ventilation and respiratory function stable Cardiovascular status: blood pressure returned to baseline and stable Postop Assessment: no signs of nausea or vomiting Anesthetic complications: no    Last Vitals:  Vitals:   12/02/15 0815 12/02/15 0830  BP: 106/80 110/78  Pulse: (!) 54 (!) 50  Resp:  16    Last Pain: There were no vitals filed for this visit.               Kirkland Figg

## 2015-12-02 NOTE — Anesthesia Procedure Notes (Signed)
Date/Time: 12/02/2015 7:45 AM Performed by: Allean Found Pre-anesthesia Checklist: Patient identified, Emergency Drugs available, Suction available, Patient being monitored and Timeout performed Patient Re-evaluated:Patient Re-evaluated prior to inductionOxygen Delivery Method: Nasal cannula Intubation Type: IV induction

## 2015-12-03 ENCOUNTER — Encounter: Payer: Self-pay | Admitting: Cardiovascular Disease

## 2016-02-12 ENCOUNTER — Ambulatory Visit (INDEPENDENT_AMBULATORY_CARE_PROVIDER_SITE_OTHER): Payer: Medicare Other | Admitting: Cardiovascular Disease

## 2016-02-12 ENCOUNTER — Encounter: Payer: Self-pay | Admitting: Cardiovascular Disease

## 2016-02-12 VITALS — BP 120/80 | HR 57 | Ht 62.0 in | Wt 97.8 lb

## 2016-02-12 DIAGNOSIS — J432 Centrilobular emphysema: Secondary | ICD-10-CM | POA: Insufficient documentation

## 2016-02-12 DIAGNOSIS — Z7189 Other specified counseling: Secondary | ICD-10-CM | POA: Diagnosis not present

## 2016-02-12 DIAGNOSIS — I480 Paroxysmal atrial fibrillation: Secondary | ICD-10-CM

## 2016-02-12 DIAGNOSIS — Z87891 Personal history of nicotine dependence: Secondary | ICD-10-CM | POA: Insufficient documentation

## 2016-02-12 DIAGNOSIS — R0989 Other specified symptoms and signs involving the circulatory and respiratory systems: Secondary | ICD-10-CM

## 2016-02-12 DIAGNOSIS — I2510 Atherosclerotic heart disease of native coronary artery without angina pectoris: Secondary | ICD-10-CM

## 2016-02-12 DIAGNOSIS — E7800 Pure hypercholesterolemia, unspecified: Secondary | ICD-10-CM

## 2016-02-12 MED ORDER — APIXABAN 5 MG PO TABS: 5.0000 mg | ORAL_TABLET | Freq: Two times a day (BID) | ORAL | 11 refills | Status: AC

## 2016-02-12 MED ORDER — SOTALOL HCL (AF) 80 MG PO TABS: 80.0000 mg | ORAL_TABLET | Freq: Two times a day (BID) | ORAL | 3 refills | Status: DC

## 2016-02-12 NOTE — Patient Instructions (Signed)
Monitor your heart rate for atrial fibrillation  Medication Instructions:   Try the crestor 5 mg  Every other day or twice a week Stay on lopid  If you go back into atrial fibrillation, Increase the sotolol up to 80 mg twice a day  Ok to hold eliquis 2 days before a colonoscopy in march 2018  Labwork:  No new labs needed  Testing/Procedures:  No further testing at this time   Follow-Up: It was a pleasure seeing you in the office today. Please call us if you have new issues that need to be addressed before your next appt.  [phone]  Your physician wants you to follow-up in: 6 months.  You will receive a reminder letter in the mail two months in advance. If you don't receive a letter, please call our office to schedule the follow-up appointment.  If you need a refill on your cardiac medications before your next appointment, please call your pharmacy.

## 2016-02-12 NOTE — Progress Notes (Addendum)
Cardiology Office Note  Date:  02/12/2016   ID:  Sarah Li, DOB [birth year], MRN [id number]  PCP:  Idelle Crouch, MD   Chief Complaint  Patient presents with  . New Patient (Initial Visit), Paroxysmal atrial fibrillation , lightheadedness , coronary disease     HPI:  Sarah Li is a very pleasant 75 year old woman with self-reported history of coronary artery disease, prior catheterization 20 years ago per the patient,  Patient of Dr. Doy Hutching History of paroxysmal atrial fibrillation, alcohol abuse Breast cancer, GERD, chronic mild lower extremity edema, hypertension, hyperlipidemia, depression, anemia,  Who presents by referral from Dr. Doy Hutching for consultation of her atrial fibrillation and coronary disease  She reports having seen Dr. Humphrey Rolls in the past from [facility], had numerous tests performed. She is unclear which tests and the results. Records have been requested  She reports a prior history of smoking, stopped in 1991 Currently on inhalers  Reports having severe shortness of breath dating back several years to 2014, Went to the emergency room, was hypoxic, 80% on room air Noted to have atrial fibrillation with RVR Admitted for COPD exacerbation and pneumonia Notes indicate clavicular fracture around that time Notes indicate she was started on amiodarone, started on anticoagulation with eliquis Echocardiogram at that time confirmed normal ejection fraction She underwent cardioversion, converted to sinus rhythm, discharged on amiodarone  For her COPD she was discharged with nebulizers, steroid taper  She reports having hypotension on her previous medications, doses were too high, she was having dizziness.  She underwent Cardioversion on 12/02/2015 for recurrent atrial fibrillation By Dr. Humphrey Rolls She feels better on low-dose sotalol 40 mg daily, holding the ACE inhibitor Blood pressure not running low, less orthostasis  Lab work reviewed showing hematocrit  32.7 Potassium 3.6 Total cholesterol 184, LDL 82 , TSH 0.58  She does report having chronic GI bleed, : Colonoscopy scheduled for March 2018 She has been scared to stop her anticoagulation Seen by Dr. Tiffany Kocher  Echo 2014: EF 65% Mod to severe mitral valve regurg Mod dilated LA  EKG 12/02/2015 showing atrial fibrillation rate 80 bpm EKG in 2014 showing normal sinus rhythm  EKG on today's visit shows no sinus rhythm with rate 57 bpm, early repolarization abnormality  PMH:   has a past medical history of Atrial fibrillation ([place]); Breast cancer ([place]); Carotid bruit; Coronary artery disease; Depression; GERD (gastroesophageal reflux disease); Gout; Hyperlipidemia; Mitral valve regurgitation; and Wolff-Parkinson-White syndrome.  PSH:    Past Surgical History:  Procedure Laterality Date  . ABDOMINAL HYSTERECTOMY    . BREAST EXCISIONAL BIOPSY Right 1980   neg bx  . BREAST EXCISIONAL BIOPSY Right 2002   breast ca radation  . CLOSED REDUCTION CLAVICLE FRACTURE    . ELECTROPHYSIOLOGIC STUDY N/A 12/02/2015   Procedure: CARDIOVERSION;  Surgeon: Dionisio David, MD;  Location: [facility] ORS;  Service: Cardiovascular;  Laterality: N/A;    Current Outpatient Prescriptions  Medication Sig Dispense Refill  . allopurinol (ZYLOPRIM) 300 MG tablet Take 300 mg by mouth daily.    Marland Kitchen apixaban (ELIQUIS) 5 MG TABS tablet Take 1 tablet (5 mg total) by mouth 2 (two) times daily. 60 tablet 11  . calcium carbonate (TUMS - DOSED IN MG ELEMENTAL CALCIUM) 500 MG chewable tablet Chew 1 tablet by mouth daily.    . Cholecalciferol (VITAMIN D3) 1000 units CAPS Take 1 capsule by mouth once a week.    . cyanocobalamin 500 MCG tablet Take 500 mcg by mouth daily.     Marland Kitchen  fluticasone (FLONASE) 50 MCG/ACT nasal spray Place 2 sprays into both nostrils daily as needed for allergies or rhinitis.    . furosemide (LASIX) 20 MG tablet Take 20 mg by mouth daily as needed.    Marland Kitchen gemfibrozil (LOPID) 600 MG tablet Take 600 mg by mouth 2  (two) times daily before a meal.    . Ipratropium-Albuterol (COMBIVENT) 20-100 MCG/ACT AERS respimat Inhale 1 puff into the lungs 2 (two) times daily as needed for wheezing.    . pantoprazole (PROTONIX) 40 MG tablet Take 40 mg by mouth daily.    Marland Kitchen SOTALOL AF 80 MG TABS Take 1 tablet (80 mg total) by mouth 2 (two) times daily. 180 each 3  . traMADol (ULTRAM) 50 MG tablet Take 50 mg by mouth daily.    . traZODone (DESYREL) 50 MG tablet Take 50 mg by mouth at bedtime.    Marland Kitchen venlafaxine (EFFEXOR) 75 MG tablet Take 75 mg by mouth 2 (two) times daily.    . vitamin B-12 (CYANOCOBALAMIN) 500 MCG tablet Take 500 mcg by mouth daily.    . Vitamin D, Ergocalciferol, (DRISDOL) 50000 units CAPS capsule Take by mouth.     No current facility-administered medications for this visit.      Allergies:   Crestor [rosuvastatin calcium] and Lipitor [atorvastatin]   Social History:  The patient  reports that she has quit smoking. Her smoking use included Cigarettes. She has a 30.00 pack-year smoking history. She quit smokeless tobacco use about 26 years ago. She reports that she does not drink alcohol or use drugs.   Family History:   family history includes Breast cancer in her sister and sister; Heart attack in her brother, brother, father, mother, sister, and sister.    Review of Systems: Review of Systems  Constitutional: Negative.   Respiratory: Negative.   Cardiovascular: Negative.   Gastrointestinal: Negative.   Musculoskeletal: Negative.   Neurological: Negative.   Psychiatric/Behavioral: Negative.   All other systems reviewed and are negative.    PHYSICAL EXAM: VS:  BP 120/80   Pulse (!) 57   Ht '5\' 2"'$  (1.575 m)   Wt 97 lb 12.8 oz (44.4 kg)   BMI 17.89 kg/m  , BMI Body mass index is 17.89 kg/m. GEN: Thin,  in no acute distress  HEENT: normal  Neck: no JVD, 1/2 + left carotid bruit, no masses Cardiac: RRR; 1-2 + murmur, no rubs, or gallops,no edema  Respiratory:  clear to auscultation  bilaterally, normal work of breathing GI: soft, nontender, nondistended, + BS MS: no deformity or atrophy  Skin: warm and dry, no rash Neuro:  Strength and sensation are intact Psych: euthymic mood, full affect    Recent Labs: No results found for requested labs within last 8760 hours.    Lipid Panel No results found for: CHOL, HDL, LDLCALC, TRIG    Wt Readings from Last 3 Encounters:  02/12/16 97 lb 12.8 oz (44.4 kg)  12/02/15 100 lb (45.4 kg)  03/29/15 97 lb (44 kg)       ASSESSMENT AND PLAN:  Paroxysmal atrial fibrillation (HCC) - Plan: EKG 12-Lead Recent cardioversion, maintained normal sinus rhythm,  No changes to her medications, currently she feels well without orthostasis   Coronary artery disease involving native heart without angina pectoris, unspecified vessel or lesion type  records have been requested from previous cardiologist. Very remote history of cardiac catheterization per the patient . Currently without symptoms of angina. No further testing ordered   Pure hypercholesterolemia  taking Lopid  Cholesterol above goal   Encounter for anticoagulation discussion and counseling Recommended that she stay on her eliquis 5 mgrams twice a day  History of smoking Stop smoking many years ago, residual lung disease, COPD  Centrilobular emphysema .  ([place]) Currently on inhalers. No recent COPD exacerbation  Carotid bruit Records requested   if no recent carotid ultrasound, a benefit from carotid ultrasound Likely with underlying PAD.  Hyperlipidemia Recommended that she take Crestor at least 2 or 3 times per week She reports history of myalgias in the past If needed may need to add zetia to reach LDL less than 70   Total encounter time more than 45 minutes  Greater than 50% was spent in counseling and coordination of care with the patient   Disposition:   F/U  6 months   Orders Placed This Encounter  Procedures  . EKG 12-Lead     Signed, Esmond Plants, M.D., Ph.D. 02/12/2016  [place], [place]

## 2016-04-08 ENCOUNTER — Telehealth: Payer: Self-pay | Admitting: Cardiovascular Disease

## 2016-04-08 NOTE — Telephone Encounter (Signed)
Pt c/o medication issue:  1. Name of Medication: sotalol (dr Humphrey Rolls prescribed that)   2. How are you currently taking this medication (dosage and times per day)? 40 mg in the morning   3. Are you having a reaction (difficulty breathing--STAT)? no  4. What is your medication issue? Her heart is beating in her ears. It is not helping since lowing the dose.

## 2016-04-08 NOTE — Telephone Encounter (Signed)
Spoke w/ pt.  She reports that she decreased her sotalol down to 40 mg once daily and she can feel her heart beating in her ears. Pt checked her vitals while on the phone: BP 140/83, HR 93. Pt misunderstood her last ov instructions and thought the sotalol was causing her increased HR. After some discussion, she is agreeable to increasing to 40 mg BID, and she can increase up to 80 mg BID if her readings remain elevated. She verbalizes understanding and will call back if sx do not improve.

## 2016-05-05 ENCOUNTER — Other Ambulatory Visit: Payer: Self-pay | Admitting: Internal Medicine

## 2016-05-05 DIAGNOSIS — Z1231 Encounter for screening mammogram for malignant neoplasm of breast: Secondary | ICD-10-CM

## 2016-06-07 ENCOUNTER — Ambulatory Visit
Admission: RE | Admit: 2016-06-07 | Discharge: 2016-06-07 | Disposition: A | Discharge status: Home / Self Care | Payer: Medicare Other | Source: Ambulatory Visit | Attending: Internal Medicine | Admitting: Internal Medicine

## 2016-06-07 DIAGNOSIS — Z1231 Encounter for screening mammogram for malignant neoplasm of breast: Secondary | ICD-10-CM

## 2016-06-07 IMAGING — MG MM DIGITAL SCREENING BILAT W/ TOMO W/ CAD
9 of 13 series · 9 of 29 positions shown · non-contrast
Comparison: Previous exam(s).

CLINICAL DATA: Screening. Prior malignant lumpectomy of the upper
inner right breast in [RG].

EXAM:
2D DIGITAL SCREENING BILATERAL MAMMOGRAM WITH CAD AND ADJUNCT TOMO

[L CC (1 of 2)]
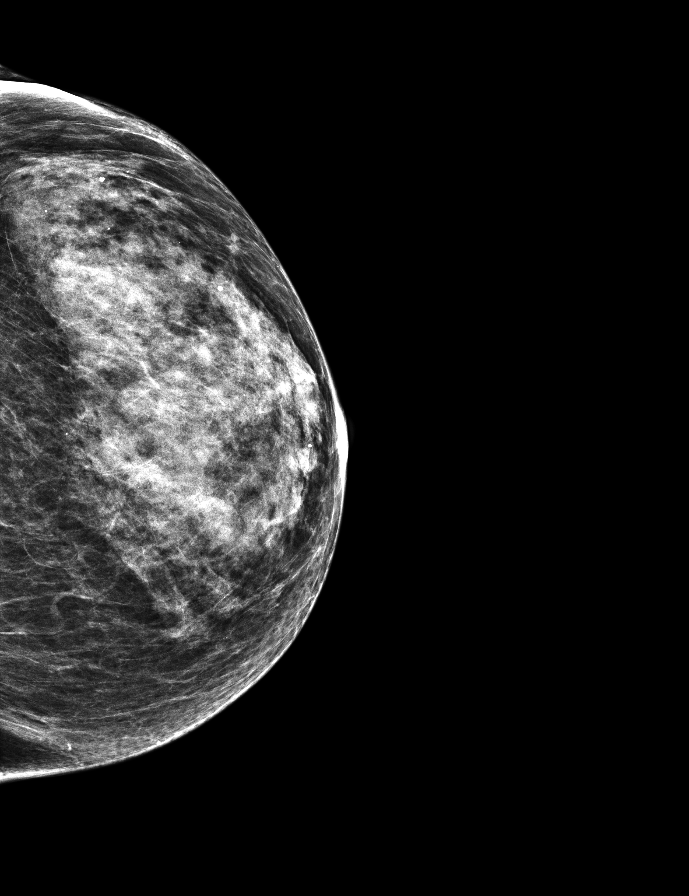

[L CC synth-2D]
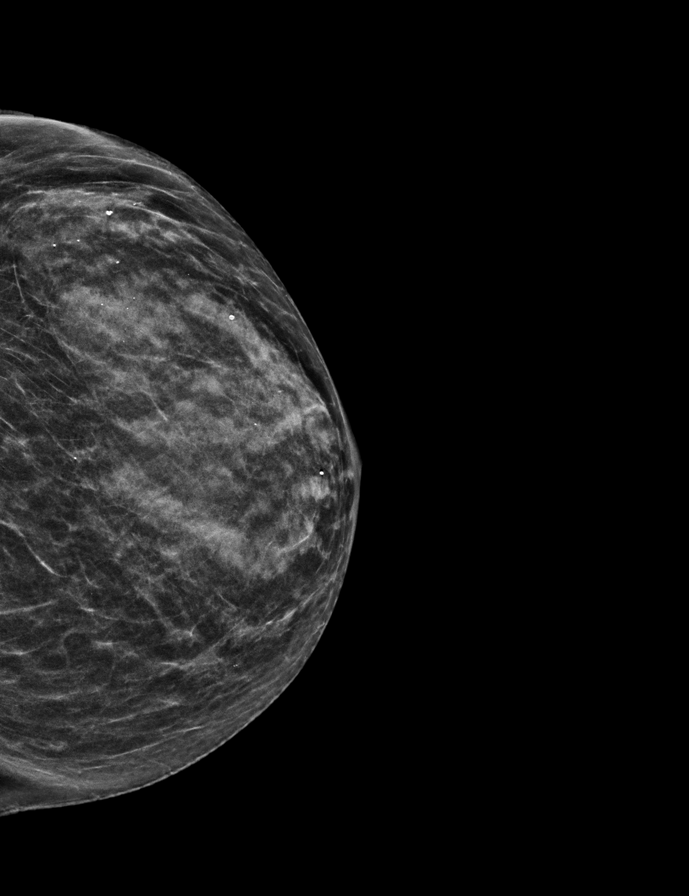

[R MLO synth-2D]
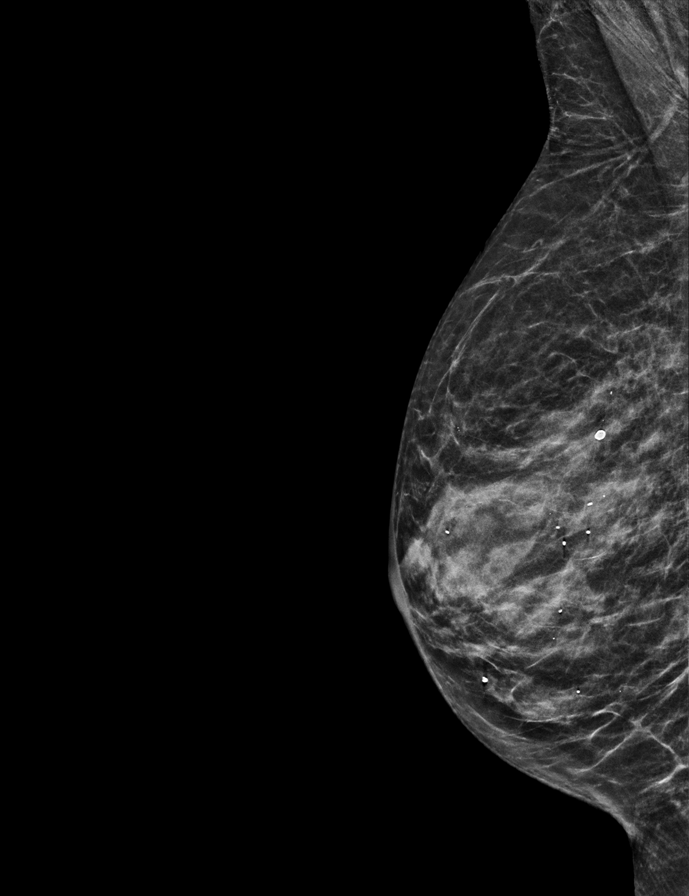

[L CC (2 of 2)]
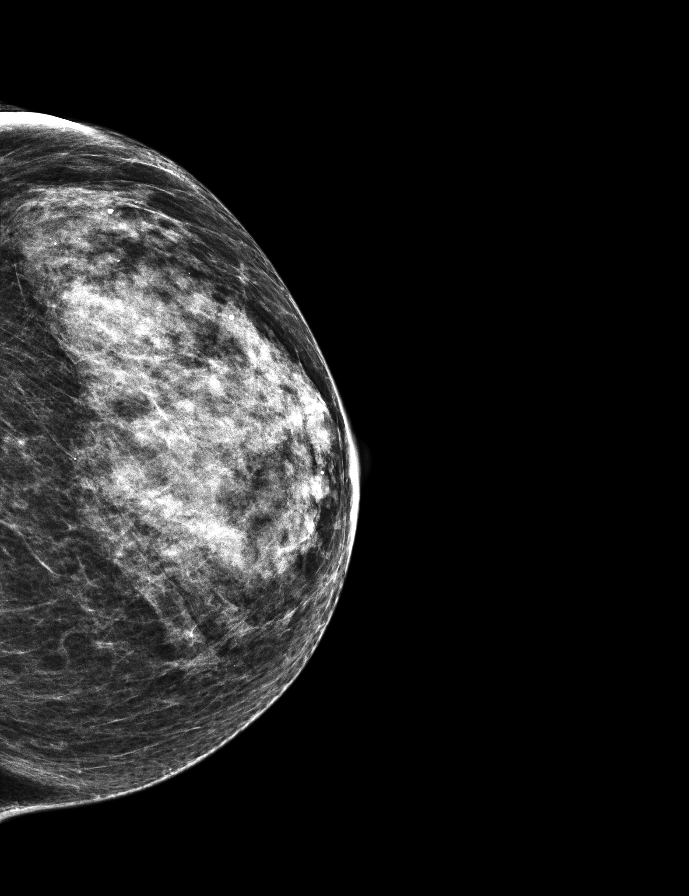

[L MLO]
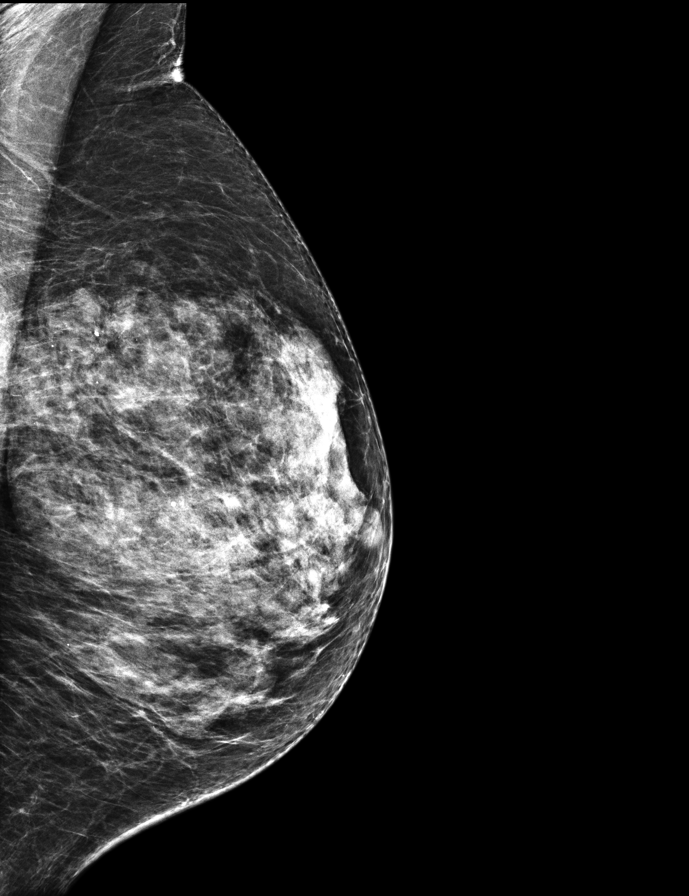

[R MLO]
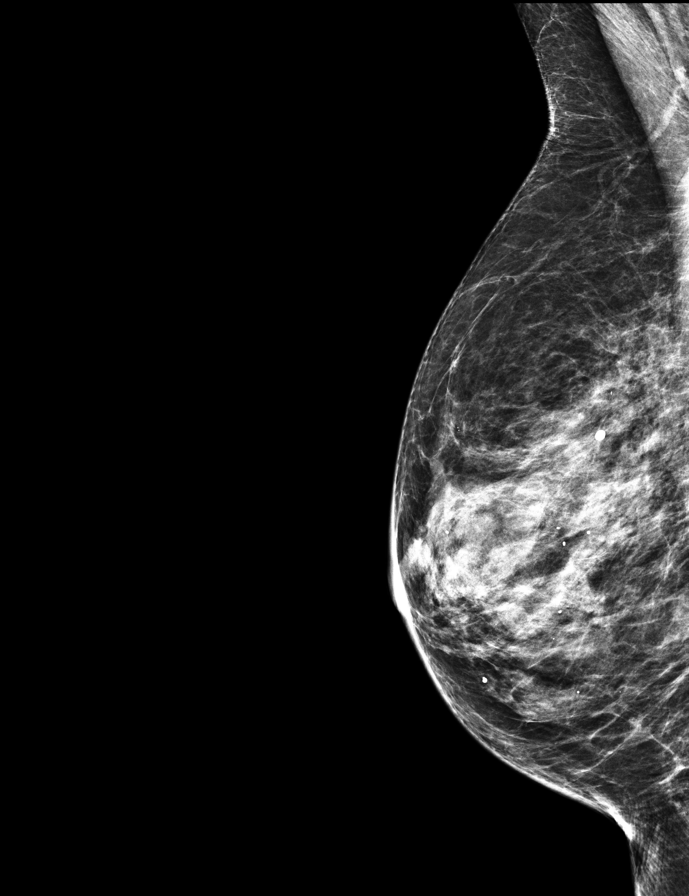

[R CC]
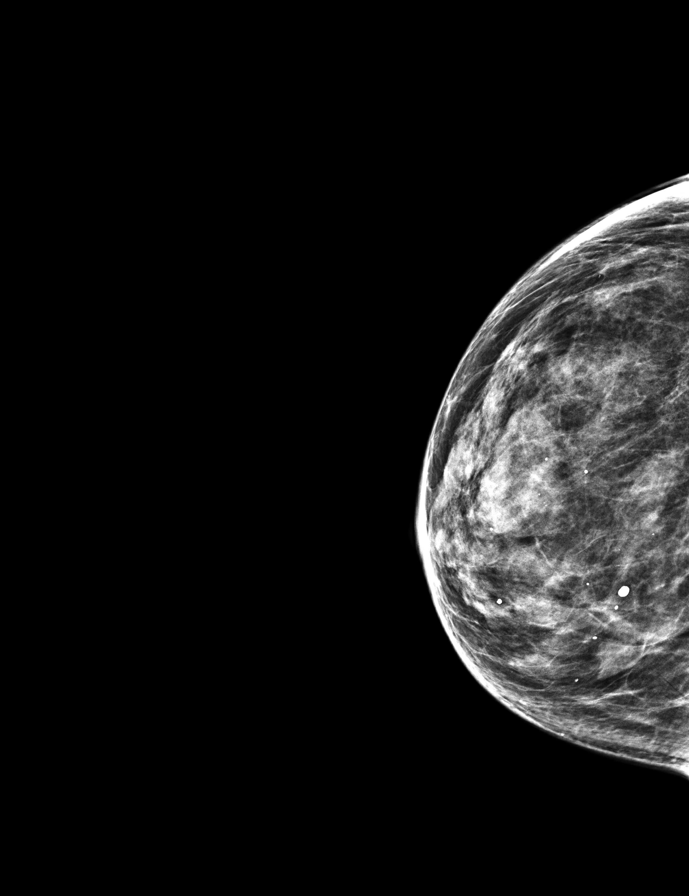

[L MLO synth-2D]
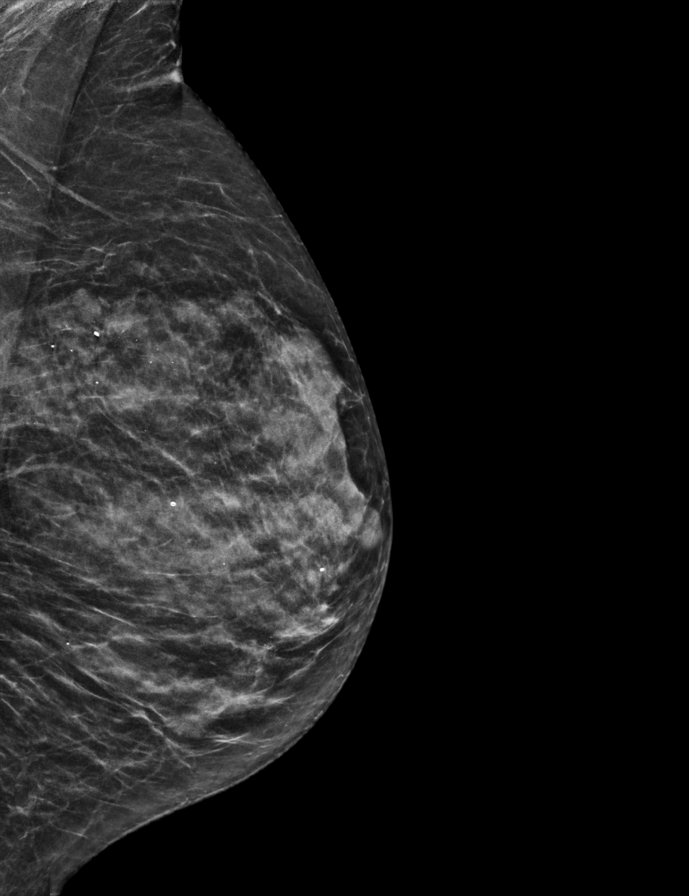

[R CC synth-2D]
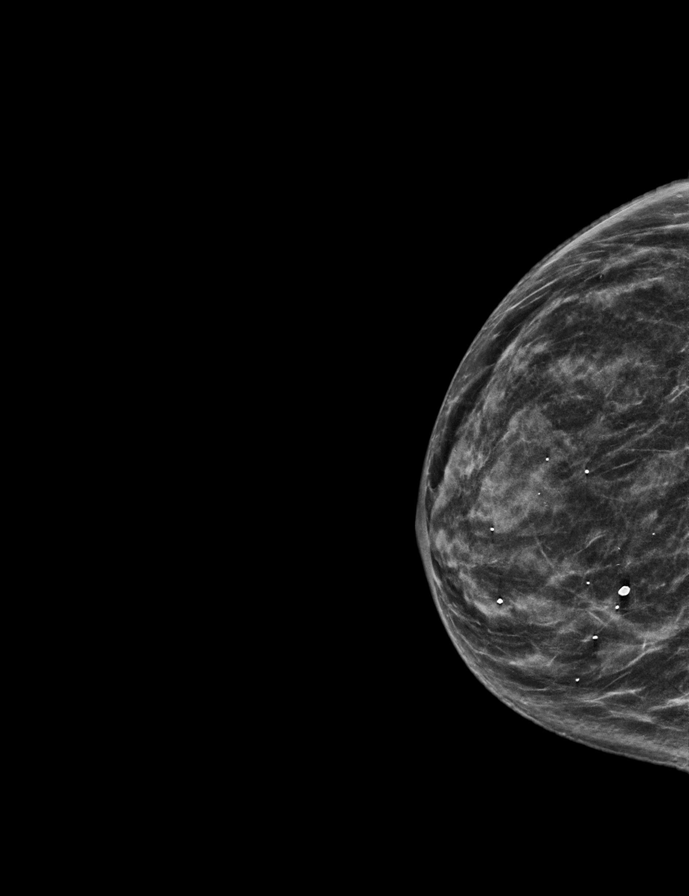

[9 of 29 positions shown; findings below may reference images not displayed]

ACR Breast Density Category d: The breast tissue is extremely dense,
which lowers the sensitivity of mammography.
FINDINGS: There are no findings suspicious for malignancy. Images were
processed with CAD.
IMPRESSION: No mammographic evidence of malignancy. A result letter of this
screening mammogram will be mailed directly to the patient.

RECOMMENDATION:
Screening mammogram in one year. (Code:[RG])

BI-RADS CATEGORY  2: Benign.

## 2016-08-14 NOTE — Progress Notes (Signed)
Cardiology Office Note  Date:  08/16/2016   ID:  Sarah Li, DOB 10-Dec-1940, MRN [id number]  PCP:  Idelle Crouch, MD   Chief Complaint  Patient presents with  . New Patient (Initial Visit), Paroxysmal atrial fibrillation , lightheadedness , coronary disease     HPI:  Sarah Li is a very pleasant 76 year old woman with self-reported history of coronary artery disease,  prior catheterization 20 years ago per the patient,  paroxysmal atrial fibrillation, noted in 2014 alcohol abuse Breast cancer,  GERD,  chronic mild lower extremity edema,  hypertension,  hyperlipidemia,  depression,  anemia,  Smoking hx, COPD seen Dr. Humphrey Rolls in the past from [place], had numerous tests performed. She is unclear which tests and the results. Who presents for f/u of her atrial fibrillation and coronary disease  She reports that she was having occasional palpitations in the late afternoon and evening  Also having chronic eating up in her years at nighttime She increased sotolol 80 in Am and 40 in the PM beating in her ears slightly better as was the palpitations  Wonders if she can go up to 80 mg twice a day on sotalol  Previous labs reviewed personally by myself and with the patient on todays visit Total chol 177, LDL 85 in 07/2016  prior history of smoking, stopped in 1991 Currently on inhalers  Reports her breathing is stable, active,   no chest pain or shortness of breath on exertion  EKG personally reviewed by myself on todays visit Shows sinus bradycardia rate 56 bpm no significant ST or T-wave changes  Other past medical history reviewed  severe shortness of breath dating back several years to 2014, Went to the emergency room, was hypoxic, 80% on room air Noted to have atrial fibrillation with RVR Admitted for COPD exacerbation and pneumonia Notes indicate clavicular fracture around that time Notes indicate she was started on amiodarone, started on anticoagulation with  eliquis Echocardiogram at that time confirmed normal ejection fraction She underwent cardioversion, converted to sinus rhythm, discharged on amiodarone   Cardioversion on 12/02/2015 for recurrent atrial fibrillation By Dr. Humphrey Rolls  Lab work reviewed showing hematocrit 32.7 Potassium 3.6 Total cholesterol 184, LDL 82 , TSH 0.58  She does report having chronic GI bleed, : Colonoscopy scheduled for March 2018 She has been scared to stop her anticoagulation Seen by Dr. Tiffany Kocher  Echo 2014: EF 65% Mod to severe mitral valve regurg Mod dilated LA  EKG 12/02/2015 showing atrial fibrillation rate 80 bpm EKG in 2014 showing normal sinus rhythm  EKG on today's visit shows no sinus rhythm with rate 57 bpm, early repolarization abnormality  PMH:   has a past medical history of Atrial fibrillation ([place]); Breast cancer ([place]) (2002); Carotid bruit; Coronary artery disease; Depression; GERD (gastroesophageal reflux disease); Gout; Hyperlipidemia; Mitral valve regurgitation; and Wolff-Parkinson-White syndrome.  PSH:    Past Surgical History:  Procedure Laterality Date  . ABDOMINAL HYSTERECTOMY    . BREAST EXCISIONAL BIOPSY Right 1980   neg bx  . BREAST EXCISIONAL BIOPSY Right 2002   breast ca radation  . CLOSED REDUCTION CLAVICLE FRACTURE    . ELECTROPHYSIOLOGIC STUDY N/A 12/02/2015   Procedure: CARDIOVERSION;  Surgeon: Dionisio David, MD;  Location: [facility] ORS;  Service: Cardiovascular;  Laterality: N/A;    Current Outpatient Prescriptions  Medication Sig Dispense Refill  . allopurinol (ZYLOPRIM) 300 MG tablet Take 300 mg by mouth daily.    Marland Kitchen apixaban (ELIQUIS) 5 MG TABS tablet Take 1 tablet (  5 mg total) by mouth 2 (two) times daily. 60 tablet 11  . calcium carbonate (TUMS - DOSED IN MG ELEMENTAL CALCIUM) 500 MG chewable tablet Chew 1 tablet by mouth daily.    . Cholecalciferol (VITAMIN D3) 1000 units CAPS Take 1 capsule by mouth once a week.    . cyanocobalamin 500 MCG tablet Take 500 mcg by  mouth daily.     . fluticasone (FLONASE) 50 MCG/ACT nasal spray Place 2 sprays into both nostrils daily as needed for allergies or rhinitis.    . furosemide (LASIX) 20 MG tablet Take 20 mg by mouth daily as needed.    Marland Kitchen gemfibrozil (LOPID) 600 MG tablet Take 600 mg by mouth 2 (two) times daily before a meal.    . Ipratropium-Albuterol (COMBIVENT) 20-100 MCG/ACT AERS respimat Inhale 1 puff into the lungs 2 (two) times daily as needed for wheezing.    Marland Kitchen SOTALOL AF 80 MG TABS Take 1 tablet (80 mg total) by mouth 2 (two) times daily. 180 each 3  . traMADol (ULTRAM) 50 MG tablet Take 50 mg by mouth daily.    . traZODone (DESYREL) 50 MG tablet Take 50 mg by mouth at bedtime.    Marland Kitchen venlafaxine (EFFEXOR) 75 MG tablet Take 75 mg by mouth 2 (two) times daily.    . Vitamin D, Ergocalciferol, (DRISDOL) 50000 units CAPS capsule Take by mouth.     No current facility-administered medications for this visit.      Allergies:   Crestor [rosuvastatin calcium] and Lipitor [atorvastatin]   Social History:  The patient  reports that she has quit smoking. Her smoking use included Cigarettes. She has a 30.00 pack-year smoking history. She quit smokeless tobacco use about 26 years ago. She reports that she does not drink alcohol or use drugs.   Family History:   family history includes Breast cancer in her sister and sister; Heart attack in her brother, brother, father, mother, sister, and sister.    Review of Systems: Review of Systems  Constitutional: Negative.   Respiratory: Negative.   Cardiovascular: Negative.   Gastrointestinal: Negative.   Musculoskeletal: Negative.   Neurological: Negative.   Psychiatric/Behavioral: Negative.   All other systems reviewed and are negative.    PHYSICAL EXAM: VS:  BP 118/68 (BP Location: Left Arm, Patient Position: Sitting, Cuff Size: Normal)   Pulse (!) 56   Ht '5\' 2"'$  (1.575 m)   Wt 97 lb 8 oz (44.2 kg)   BMI 17.83 kg/m  , BMI Body mass index is 17.83  kg/m. GEN: Thin,  in no acute distress  HEENT: normal  Neck: no JVD, 1/2 + left carotid bruit, no masses Cardiac: RRR; 1+ murmur, no rubs, or gallops,no edema  Respiratory:  clear to auscultation bilaterally, normal work of breathing GI: soft, nontender, nondistended, + BS MS: no deformity or atrophy  Skin: warm and dry, no rash Neuro:  Strength and sensation are intact Psych: euthymic mood, full affect    Recent Labs: No results found for requested labs within last 8760 hours.    Lipid Panel No results found for: CHOL, HDL, LDLCALC, TRIG    Wt Readings from Last 3 Encounters:  08/16/16 97 lb 8 oz (44.2 kg)  02/12/16 97 lb 12.8 oz (44.4 kg)  12/02/15 100 lb (45.4 kg)       ASSESSMENT AND PLAN:   Paroxysmal atrial fibrillation (HCC) - Plan: EKG 12-Lead  maintained normal sinus rhythm, Previous cardioversion  Continue anticoagulation  Coronary artery disease involving  native heart without angina pectoris, unspecified vessel or lesion type Currently with no symptoms of angina. No further workup at this time. Continue current medication regimen.  Pure hypercholesterolemia  she is taking Crestor 3 days per week Encouraged her to add Zetia daily Goal LDL less than 70  Encounter for anticoagulation discussion and counseling  stay on her eliquis 5 mgrams twice a day  History of smoking Stop smoking many years ago, residual lung disease, COPD  Centrilobular emphysema .  ([place]) Currently on inhalers. No recent COPD exacerbation  Carotid bruit Appreciated on her prior clinic visit ,  we did not receive outside records with carotid ultrasound    Total encounter time more than 25 minutes  Greater than 50% was spent in counseling and coordination of care with the patient   Disposition:   F/U  12 months   Orders Placed This Encounter  Procedures  . EKG 12-Lead     Signed, Esmond Plants, M.D., Ph.D. 08/16/2016  [facility] [place],  [place] [phone]

## 2016-08-16 ENCOUNTER — Encounter: Payer: Self-pay | Admitting: Cardiovascular Disease

## 2016-08-16 ENCOUNTER — Ambulatory Visit (INDEPENDENT_AMBULATORY_CARE_PROVIDER_SITE_OTHER): Payer: Medicare Other | Admitting: Cardiovascular Disease

## 2016-08-16 VITALS — BP 118/68 | HR 56 | Ht 62.0 in | Wt 97.5 lb

## 2016-08-16 DIAGNOSIS — J432 Centrilobular emphysema: Secondary | ICD-10-CM | POA: Diagnosis not present

## 2016-08-16 DIAGNOSIS — Z7189 Other specified counseling: Secondary | ICD-10-CM

## 2016-08-16 DIAGNOSIS — R0989 Other specified symptoms and signs involving the circulatory and respiratory systems: Secondary | ICD-10-CM

## 2016-08-16 DIAGNOSIS — I480 Paroxysmal atrial fibrillation: Secondary | ICD-10-CM

## 2016-08-16 DIAGNOSIS — Z87891 Personal history of nicotine dependence: Secondary | ICD-10-CM

## 2016-08-16 MED ORDER — EZETIMIBE 10 MG PO TABS: 10.0000 mg | ORAL_TABLET | Freq: Every day | ORAL | 3 refills | Status: AC

## 2016-08-16 MED ORDER — ROSUVASTATIN CALCIUM 10 MG PO TABS
10.0000 mg | ORAL_TABLET | Freq: Every day | ORAL | 3 refills | Status: DC
Start: 2016-08-16 — End: 2017-08-16

## 2016-08-16 NOTE — Patient Instructions (Addendum)
Medication Instructions:   Try to change the timing of the sotolol 40 mg in the Am and 80 mg in the PM  Please add zetia one a day for cholesterol  Labwork:  No new labs needed  Testing/Procedures:  No further testing at this time   I recommend watching educational videos on topics of interest to you at:       [email or web address]  Enter code: [email or web address]    Follow-Up: It was a pleasure seeing you in the office today. Please call us if you have new issues that need to be addressed before your next appt.  [phone]  Your physician wants you to follow-up in: 12 months.  You will receive a reminder letter in the mail two months in advance. If you don't receive a letter, please call our office to schedule the follow-up appointment.  If you need a refill on your cardiac medications before your next appointment, please call your pharmacy.

## 2016-10-14 ENCOUNTER — Telehealth: Payer: Self-pay | Admitting: Cardiovascular Disease

## 2016-10-14 NOTE — Telephone Encounter (Signed)
Received cardiac clearance request for pt to proceed w/ colonoscopy/EGD on 01/10/17 @ [facility] w/ monitored anesthesia.  Please route clearance and most recent ov to [facility] @ [phone].

## 2016-10-17 NOTE — Telephone Encounter (Signed)
Acceptable risk for procedure Would stop eliquis 2 days prior to the procedure, restart after procedure complete

## 2016-10-21 NOTE — Telephone Encounter (Signed)
Routed to fax # provided. 

## 2017-01-10 ENCOUNTER — Ambulatory Visit: Admit: 2017-01-10 | Payer: Medicare Other | Admitting: Unknown Physician Specialty

## 2017-01-10 SURGERY — COLONOSCOPY WITH PROPOFOL
Anesthesia: General

## 2017-05-10 ENCOUNTER — Other Ambulatory Visit: Payer: Self-pay | Admitting: Internal Medicine

## 2017-05-10 DIAGNOSIS — Z1231 Encounter for screening mammogram for malignant neoplasm of breast: Secondary | ICD-10-CM

## 2017-06-08 ENCOUNTER — Ambulatory Visit
Admission: RE | Admit: 2017-06-08 | Discharge: 2017-06-08 | Disposition: A | Discharge status: Home / Self Care | Payer: Medicare Other | Source: Ambulatory Visit | Attending: Internal Medicine | Admitting: Internal Medicine

## 2017-06-08 DIAGNOSIS — Z1231 Encounter for screening mammogram for malignant neoplasm of breast: Secondary | ICD-10-CM | POA: Diagnosis not present

## 2017-06-08 IMAGING — MG MM DIGITAL SCREENING BILAT W/ TOMO W/ CAD
9 of 14 series · 9 of 30 positions shown · non-contrast
Comparison: Previous exam(s).

CLINICAL DATA: Screening.

EXAM:
DIGITAL SCREENING BILATERAL MAMMOGRAM WITH TOMO AND CAD

[L CC (1 of 2)]
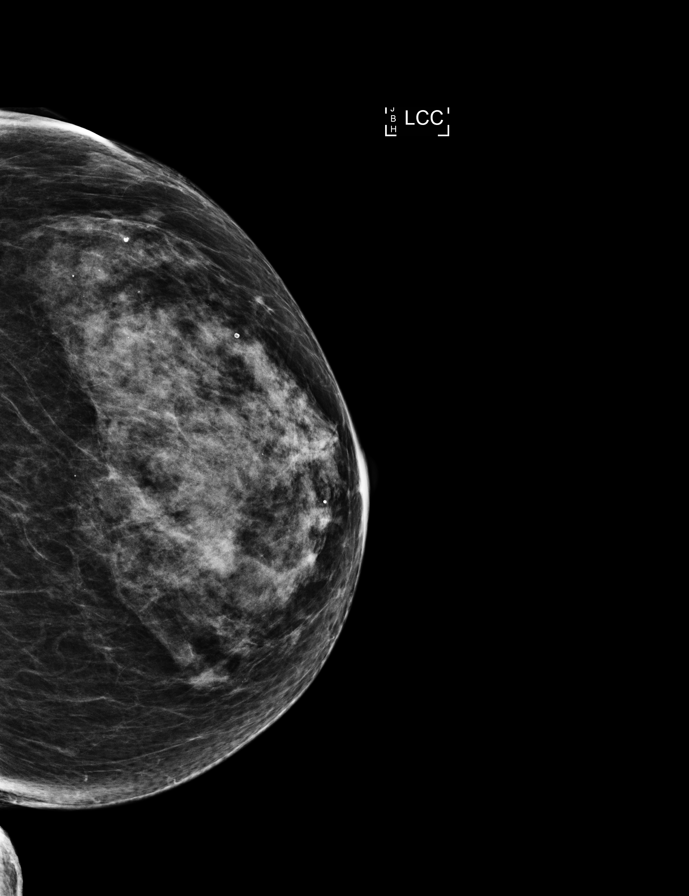

[R CC (1 of 2)]
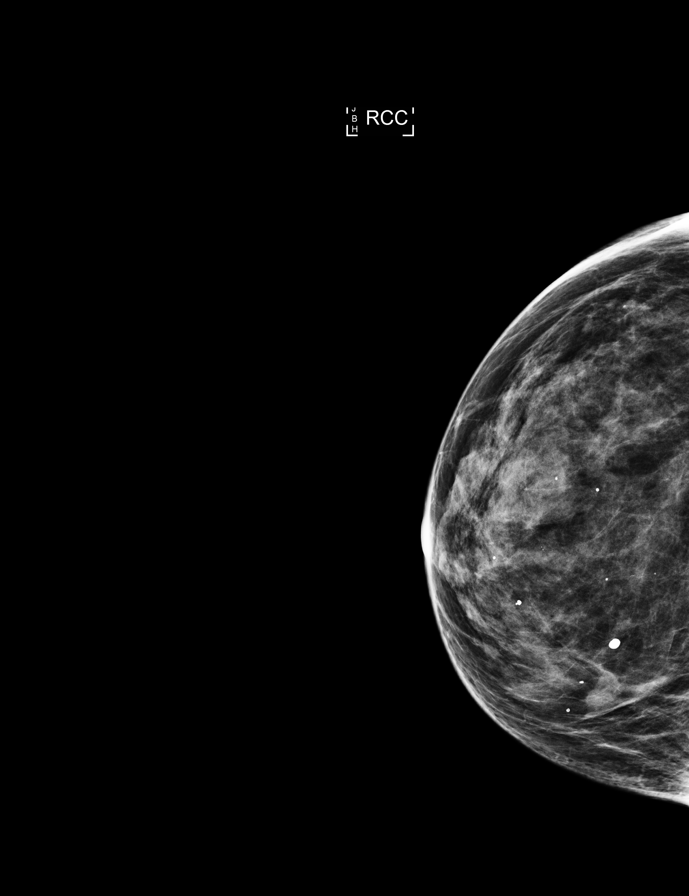

[L MLO synth-2D]
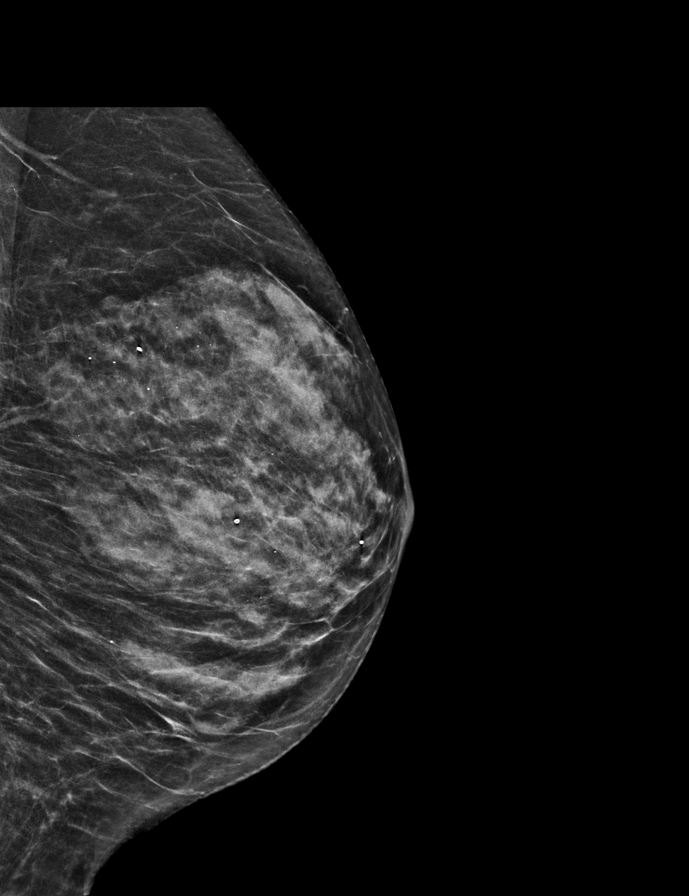

[R CC synth-2D]
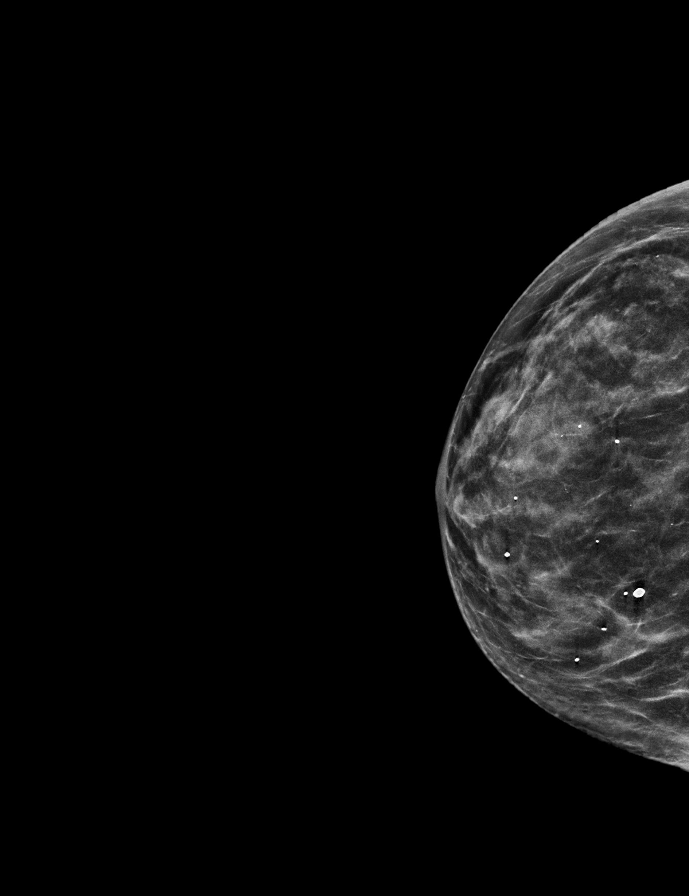

[L CC synth-2D]
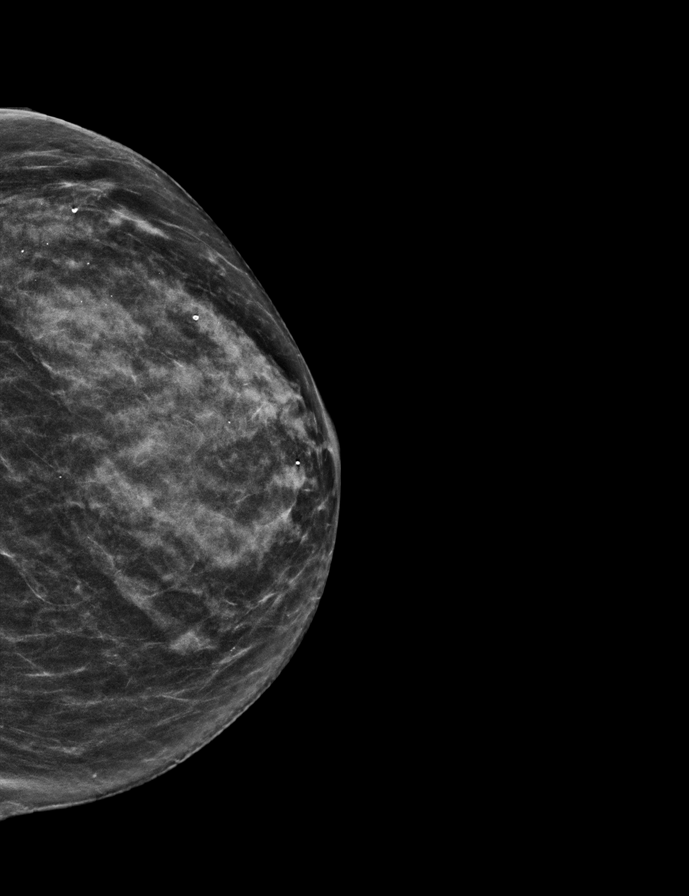

[L CC (2 of 2)]
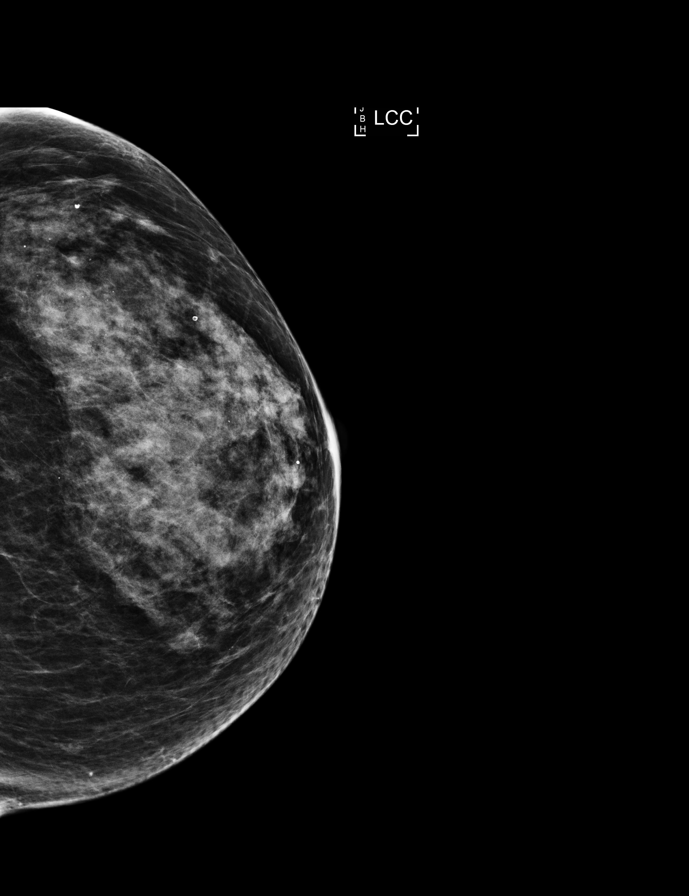

[R CC (2 of 2)]
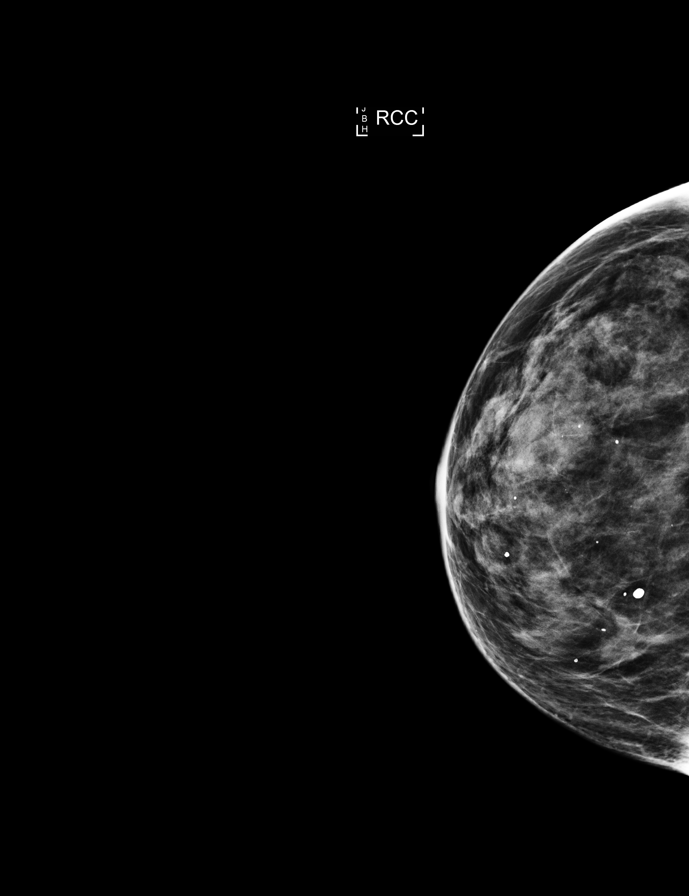

[R MLO]
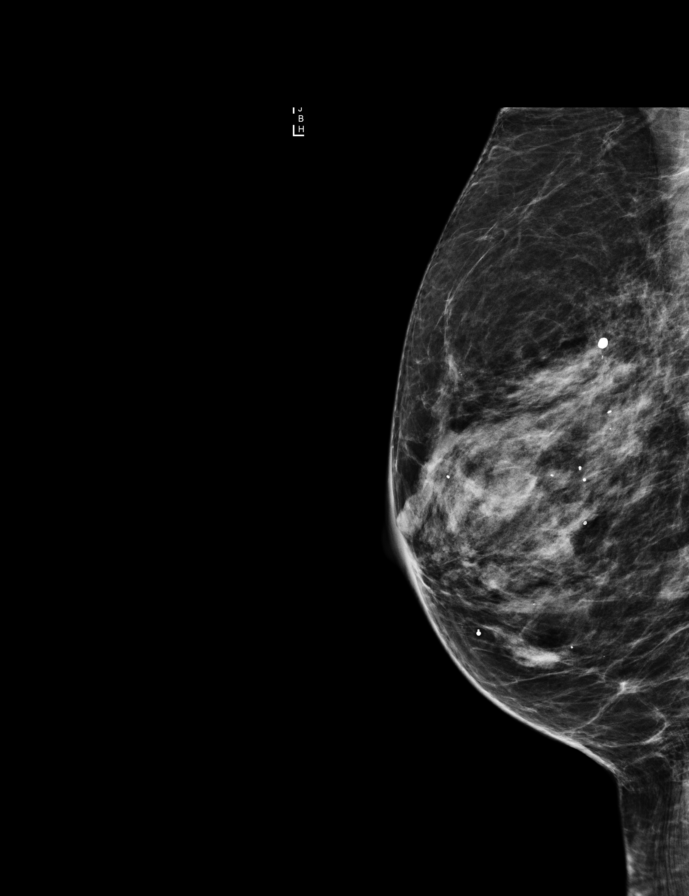

[R MLO synth-2D]
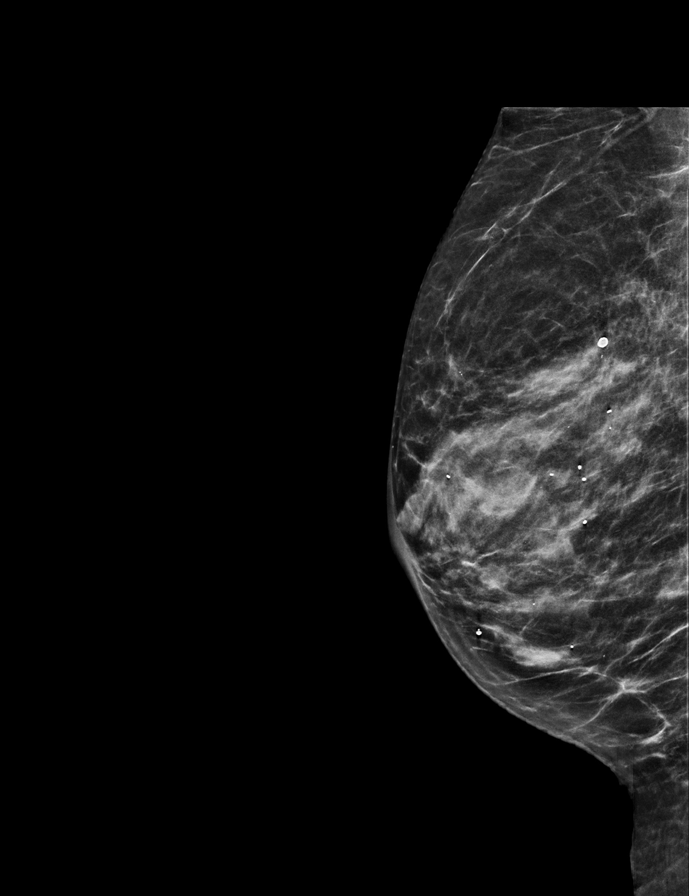

[9 of 30 positions shown; findings below may reference images not displayed]

ACR Breast Density Category d: The breast tissue is extremely dense,
which lowers the sensitivity of mammography.
FINDINGS: There are no findings suspicious for malignancy. Images were
processed with CAD.
IMPRESSION: No mammographic evidence of malignancy. A result letter of this
screening mammogram will be mailed directly to the patient.

RECOMMENDATION:
Screening mammogram in one year. (Code:[PU])

BI-RADS CATEGORY  1: Negative.

## 2017-08-13 NOTE — Progress Notes (Signed)
Cardiology Office Note  Date:  08/16/2017   ID:  Sarah Li, DOB April 02, 1941, MRN [id number]  PCP:  Idelle Crouch, MD   Chief Complaint  Patient presents with  . other    12 month f/u pt mentioned she D/c Crestor when she started Zetia please confirm if she should be taking. Meds reviewed verbally with pt.     HPI:  Sarah Li is a very pleasant 77 year old woman with self-reported history of coronary artery disease,  prior catheterization 20 years ago per the patient,  paroxysmal atrial fibrillation, noted in 2014 alcohol abuse Breast cancer,  GERD,  chronic mild lower extremity edema,  hypertension,  hyperlipidemia,   depression,  anemia, stable Smoking hx, COPD seen Dr. Humphrey Rolls in the past from [place], had numerous tests performed. She is unclear which tests and the results. Who presents for f/u of her atrial fibrillation and coronary disease   in follow-up today she reports that she is feeling well Denies any chest pain or shortness of breath on exertion No regular exercise program but stays very active  On last clinic visit we recommended she add Zetia to her Crestor She misunderstood what we said and stop the Crestor and started [employer] reviewed with her in detail Total chol 146, LDL 62 She denies having any side effects   CBC, BMP normal Weight is stable   denies having significant tachycardia or palpitations concerning for atrial fibrillation  Tolerating sotalol and eliquis 5 bid  prior history of smoking, stopped in 1991 Currently on inhalers  EKG personally reviewed by myself on todays visit Shows sinus bradycardia rate 58 bpm no significant ST or T-wave changes  Other past medical history reviewed  severe shortness of breath dating back several years to 2014, Went to the emergency room, was hypoxic, 80% on room air Noted to have atrial fibrillation with RVR Admitted for COPD exacerbation and pneumonia Notes indicate clavicular fracture  around that time Notes indicate she was started on amiodarone, started on anticoagulation with eliquis Echocardiogram at that time confirmed normal ejection fraction She underwent cardioversion, converted to sinus rhythm, discharged on amiodarone   Cardioversion on 12/02/2015 for recurrent atrial fibrillation By Dr. Humphrey Rolls  Lab work reviewed showing hematocrit 32.7 Potassium 3.6 Total cholesterol 184, LDL 82 , TSH 0.58  She does report having chronic GI bleed, : Colonoscopy scheduled for March 2018 She has been scared to stop her anticoagulation Seen by Dr. Tiffany Kocher  Echo 2014: EF 65% Mod to severe mitral valve regurg Mod dilated LA  EKG 12/02/2015 showing atrial fibrillation rate 80 bpm EKG in 2014 showing normal sinus rhythm  EKG on today's visit shows no sinus rhythm with rate 57 bpm, early repolarization abnormality  PMH:   has a past medical history of Atrial fibrillation ([place]), Breast cancer ([place]) (2002), Carotid bruit, Coronary artery disease, Depression, GERD (gastroesophageal reflux disease), Gout, Hyperlipidemia, Mitral valve regurgitation, and Wolff-Parkinson-White syndrome.  PSH:    Past Surgical History:  Procedure Laterality Date  . ABDOMINAL HYSTERECTOMY    . BREAST EXCISIONAL BIOPSY Right 1980   neg bx  . BREAST EXCISIONAL BIOPSY Right 2002   breast ca radation  . CLOSED REDUCTION CLAVICLE FRACTURE    . ELECTROPHYSIOLOGIC STUDY N/A 12/02/2015   Procedure: CARDIOVERSION;  Surgeon: Dionisio David, MD;  Location: [facility] ORS;  Service: Cardiovascular;  Laterality: N/A;    Current Outpatient Medications  Medication Sig Dispense Refill  . allopurinol (ZYLOPRIM) 300 MG tablet Take 300 mg  by mouth daily.    Marland Kitchen apixaban (ELIQUIS) 5 MG TABS tablet Take 1 tablet (5 mg total) by mouth 2 (two) times daily. 60 tablet 11  . calcium carbonate (TUMS - DOSED IN MG ELEMENTAL CALCIUM) 500 MG chewable tablet Chew 1 tablet by mouth daily.    . Cholecalciferol (VITAMIN D3) 1000 units  CAPS Take 1 capsule by mouth once a week.    . cyanocobalamin 500 MCG tablet Take 500 mcg by mouth daily.     Marland Kitchen ezetimibe (ZETIA) 10 MG tablet Take 1 tablet (10 mg total) by mouth daily. 90 tablet 3  . fluticasone (FLONASE) 50 MCG/ACT nasal spray Place 2 sprays into both nostrils daily as needed for allergies or rhinitis.    . furosemide (LASIX) 20 MG tablet Take 20 mg by mouth daily as needed.    Marland Kitchen gemfibrozil (LOPID) 600 MG tablet Take 600 mg by mouth 2 (two) times daily before a meal.    . SOTALOL AF 80 MG TABS Take 1 tablet (80 mg total) by mouth 2 (two) times daily. 180 each 3  . traMADol (ULTRAM) 50 MG tablet Take 50 mg by mouth as needed.     . traZODone (DESYREL) 50 MG tablet Take 50 mg by mouth at bedtime.    Marland Kitchen venlafaxine (EFFEXOR) 75 MG tablet Take 75 mg by mouth as needed.     . Vitamin D, Ergocalciferol, (DRISDOL) 50000 units CAPS capsule Take by mouth.     No current facility-administered medications for this visit.      Allergies:   Crestor [rosuvastatin calcium] and Lipitor [atorvastatin]   Social History:  The patient  reports that she has quit smoking. Her smoking use included cigarettes. She has a 30.00 pack-year smoking history. She quit smokeless tobacco use about 27 years ago. She reports that she does not drink alcohol or use drugs.   Family History:   family history includes Breast cancer in her sister and sister; Heart attack in her brother, brother, father, mother, sister, and sister.    Review of Systems: Review of Systems  Constitutional: Negative.   Respiratory: Negative.   Cardiovascular: Negative.   Gastrointestinal: Negative.   Musculoskeletal: Negative.   Neurological: Negative.   Psychiatric/Behavioral: Negative.   All other systems reviewed and are negative.    PHYSICAL EXAM: VS:  BP 110/76 (BP Location: Left Arm, Patient Position: Sitting, Cuff Size: Normal)   Pulse (!) 58   Ht 5\' 2"  (1.575 m)   Wt 101 lb 8 oz (46 kg)   BMI 18.56 kg/m  ,  BMI Body mass index is 18.56 kg/m. Constitutional:  oriented to person, place, and time. No distress.  HENT:  Head: Normocephalic and atraumatic.  Eyes:  no discharge. No scleral icterus.  Neck: Normal range of motion. Neck supple. No JVD present.  Cardiovascular: Normal rate, regular rhythm, normal heart sounds and intact distal pulses. Exam reveals no gallop and no friction rub. No edema 1/6 systolic ejection murmur heard right sternal border Pulmonary/Chest: Effort normal and breath sounds normal. No stridor. No respiratory distress.  no wheezes.  no rales.  no tenderness.  Abdominal: Soft.  no distension.  no tenderness.  Musculoskeletal: Normal range of motion.  no  tenderness or deformity.  Neurological:  normal muscle tone. Coordination normal. No atrophy Skin: Skin is warm and dry. No rash noted. not diaphoretic.  Psychiatric:  normal mood and affect. behavior is normal. Thought content normal.    Recent Labs: No results found for  requested labs within last 8760 hours.    Lipid Panel No results found for: CHOL, HDL, LDLCALC, TRIG    Wt Readings from Last 3 Encounters:  08/16/17 101 lb 8 oz (46 kg)  08/16/16 97 lb 8 oz (44.2 kg)  02/12/16 97 lb 12.8 oz (44.4 kg)      ASSESSMENT AND PLAN:  Paroxysmal atrial fibrillation (HCC) - Plan: EKG 12-Lead  maintained normal sinus rhythm, Previous cardioversion  Continue anticoagulation Stable with no symptoms  Coronary artery disease involving native heart without angina pectoris, unspecified vessel or lesion type Currently with no symptoms of angina. No further workup at this time. Continue current medication regimen.  Stable  Pure hypercholesterolemia  Zetia daily,  numbers at goal discussed with her in detail today Goal LDL less than 70  Encounter for anticoagulation discussion and counseling  stay on her eliquis 5 mgrams twice a day no falls, no heavy bruising or bleeding   History of smoking Reports that she  stopped smoking many years ago, residual lung disease, COPD Denies significant shortness of breath  Centrilobular emphysema .  ([place]) Currently on inhalers. No recent COPD exacerbation Stable  Carotid bruit She reports having prior ultrasound  no significant bruit on exam   Total encounter time more than 25 minutes  Greater than 50% was spent in counseling and coordination of care with the patient   Disposition:   F/U  12 months   No orders of the defined types were placed in this encounter.    Signed, Esmond Plants, M.D., Ph.D. 08/16/2017  [place], [place]

## 2017-08-16 ENCOUNTER — Ambulatory Visit (INDEPENDENT_AMBULATORY_CARE_PROVIDER_SITE_OTHER): Payer: Medicare Other | Admitting: Cardiovascular Disease

## 2017-08-16 ENCOUNTER — Encounter: Payer: Self-pay | Admitting: Cardiovascular Disease

## 2017-08-16 VITALS — BP 110/76 | HR 58 | Ht 62.0 in | Wt 101.5 lb

## 2017-08-16 DIAGNOSIS — I480 Paroxysmal atrial fibrillation: Secondary | ICD-10-CM

## 2017-08-16 DIAGNOSIS — R0989 Other specified symptoms and signs involving the circulatory and respiratory systems: Secondary | ICD-10-CM | POA: Diagnosis not present

## 2017-08-16 DIAGNOSIS — Z7189 Other specified counseling: Secondary | ICD-10-CM | POA: Diagnosis not present

## 2017-08-16 DIAGNOSIS — I2510 Atherosclerotic heart disease of native coronary artery without angina pectoris: Secondary | ICD-10-CM | POA: Diagnosis not present

## 2017-08-16 DIAGNOSIS — Z87891 Personal history of nicotine dependence: Secondary | ICD-10-CM

## 2017-08-16 DIAGNOSIS — J432 Centrilobular emphysema: Secondary | ICD-10-CM

## 2017-08-16 DIAGNOSIS — E7800 Pure hypercholesterolemia, unspecified: Secondary | ICD-10-CM

## 2017-08-16 MED ORDER — POTASSIUM CHLORIDE ER 10 MEQ PO TBCR: 10.0000 meq | EXTENDED_RELEASE_TABLET | Freq: Every day | ORAL | 2 refills | Status: DC | PRN

## 2017-08-16 NOTE — Patient Instructions (Signed)
Medication Instructions:   When you take lasix/furosemide, Take with one potassium  Labwork:  No new labs needed  Testing/Procedures:  No further testing at this time   Follow-Up: It was a pleasure seeing you in the office today. Please call us if you have new issues that need to be addressed before your next appt.  [phone]  Your physician wants you to follow-up in: 12 months.  You will receive a reminder letter in the mail two months in advance. If you don't receive a letter, please call our office to schedule the follow-up appointment.  If you need a refill on your cardiac medications before your next appointment, please call your pharmacy.  For educational health videos Log in to : [email or web address] Or : [email or web address], password : [email or web address]

## 2017-11-07 ENCOUNTER — Telehealth: Payer: Self-pay | Admitting: Cardiovascular Disease

## 2017-11-07 NOTE — Telephone Encounter (Signed)
° °  [facility] Pre-operative Risk Assessment    Request for surgical clearance:  1. What type of surgery is being performed? Levator advancement     2. When is this surgery scheduled? 11-29-17   3. What type of clearance is required (medical clearance vs. Pharmacy clearance to hold med vs. Both)? Pharmacy   4. Are there any medications that need to be held prior to surgery and how long?coumadin plavix asa nsaids for 4 days prior and eliquis    5. Practice name and name of physician performing surgery? [facility]  Dr. Vickki Muff  6. What is your office phone number [phone]    7.   What is your office fax number   [phone]  8.   Anesthesia type (None, local, MAC, general) ? Not noted    Sarah Li 11/07/2017, 10:24 AM  _________________________________________________________________   (provider comments below)

## 2017-11-07 NOTE — Telephone Encounter (Signed)
Last seen 08/16/17. On Eliquis for A Fib. Routing to Dr Rockey Situ.

## 2017-11-09 NOTE — Telephone Encounter (Signed)
Acceptable risk for procedure She can hold the eliquis or 2 days prior to the procedure Should not need more than 3 days If more days needed would certainly confirm with ophthalmology

## 2017-11-09 NOTE — Telephone Encounter (Signed)
Received 2nd ASAP request.  Faxed response back to [facility] MD review pending.

## 2017-11-10 NOTE — Telephone Encounter (Signed)
Clearance routed to number listed. 

## 2017-11-10 NOTE — Telephone Encounter (Signed)
Spoke with patient and advised would be up to Ophthalmologist to determine when to resume. If any further questions call back

## 2017-11-10 NOTE — Telephone Encounter (Signed)
Pt has some questions regarding when she should restart her Eliquis.

## 2017-11-21 ENCOUNTER — Encounter: Payer: Self-pay | Admitting: *Deleted

## 2017-11-21 ENCOUNTER — Other Ambulatory Visit: Payer: Self-pay

## 2017-11-22 NOTE — Anesthesia Preprocedure Evaluation (Addendum)
Anesthesia Evaluation  Patient identified by MRN, date of birth, ID band Patient awake    Reviewed: Allergy & Precautions, NPO status , Patient's Chart, lab work & pertinent test results  History of Anesthesia Complications Negative for: history of anesthetic complications  Airway Mallampati: I  TM Distance: >3 FB Neck ROM: Full    Dental   Left lower permanent bridge; no loose or chipped teeth per pt:   Pulmonary former smoker (quit 1991),    Pulmonary exam normal breath sounds clear to auscultation       Cardiovascular Exercise Tolerance: Good + CAD  Normal cardiovascular exam+ dysrhythmias (a fib on Eliquis; WPW) + Valvular Problems/Murmurs MR  Rhythm:Regular Rate:Normal  From Cardiac note. "Coronary artery disease involving native heart without angina pectoris, unspecified vessel or lesion type Currently with no symptoms of angina. No further workup at this time. Continue current medication regimen."   Phone message from cardiologist. "Acceptable risk for procedure She can hold the eliquis or 2 days prior to the procedure Should not need more than 3 days If more days needed would certainly confirm with ophthalmology   Neuro/Psych negative neurological ROS     GI/Hepatic GERD  Controlled,  Endo/Other  negative endocrine ROS  Renal/GU negative Renal ROS     Musculoskeletal   Abdominal   Peds  Hematology Breast CA   Anesthesia Other Findings Gout; Raynaud's  Reproductive/Obstetrics                           Anesthesia Physical Anesthesia Plan  ASA: III  Anesthesia Plan: MAC   Post-op Pain Management:    Induction: Intravenous  PONV Risk Score and Plan: 3 and TIVA and Midazolam  Airway Management Planned: Natural Airway  Additional Equipment:   Intra-op Plan:   Post-operative Plan:   Informed Consent: I have reviewed the patients History and Physical, chart, labs and  discussed the procedure including the risks, benefits and alternatives for the proposed anesthesia with the patient or authorized representative who has indicated his/her understanding and acceptance.     Plan Discussed with: CRNA  Anesthesia Plan Comments:         Anesthesia Quick Evaluation

## 2017-11-24 NOTE — Discharge Instructions (Signed)
INSTRUCTIONS FOLLOWING OCULOPLASTIC SURGERY AMY Dennie Maizes, MD  AFTER YOUR EYE SURGERY, THER ARE MANY THINGS [place] YOU, THE PATIENT, CAN DO TO ASSURE THE BEST POSSIBLE RESULT FROM YOUR OPERATION.  THIS SHEET SHOULD BE REFERRED TO WHENEVER QUESTIONS ARISE.  IF THERE ARE ANY QUESTIONS NOT ANSWERED HERE, DO NOT HESITATE TO CALL OUR OFFICE AT [phone] OR [phone].  THERE IS ALWAYS [facility] AVAILABLE TO CALL IF QUESTIONS OR PROBLEMS ARISE.  VISION: Your vision may be blurred and out of focus after surgery until you are able to stop using your ointment, swelling resolves and your eye(s) heal. This may take 1 to 2 weeks at the least.  If your vision becomes gradually more dim or dark, this is not normal and you need to call our office immediately.  EYE CARE: For the first 48 hours after surgery, use ice packs frequently - 20 minutes on, 20 minutes off - to help reduce swelling and bruising.  Small bags of frozen peas or corn make good ice packs along with cloths soaked in ice water.  If you are wearing a patch or other type of dressing following surgery, keep this on for the amount of time specified by your doctor.  For the first week following surgery, you will need to treat your stitches with great care.  If is OK to shower, but take care to not allow soapy water to run into your eye(s) to help reduce changes of infection.  You may gently clean the eyelashes and around the eye(s) with cotton balls and sterile water, BUT DO NOT RUB THE STITCHES VIGOROUSLY.  Keeping your stitches moist with ointment will help promote healing with minimal scar formation.  ACTIVITY: When you leave the surgery center, you should go home, rest and be inactive.  The eye(s) may feel scratchy and keeping the eyes closed will allow for faster healing.  The first week following surgery, avoid straining (anything making the face turn red) or lifting over 20 pounds.  Additionally, avoid bending which causes your head to go below  your waist.  Using your eyes will NOT harm them, so feel free to read, watch television, use the computer, etc as desired.  Driving depends on each individual, so check with your doctor if you have questions about driving.  MEDICATIONS:  You will be given a prescription for an ointment to use 4 times a day on your stitches.  You can use the ointment in your eyes if they feel scratchy or irritated.  If you eyelid(s) dont close completely when you sleep, put some ointment in your eyes before bedtime.  EMERGENCY: If you experience SEVERE EYE PAIN OR HEADACHE UNRELIEVED BY TYLENOL OR PERCOCET, NAUSEA OR VOMITING, WORSENING REDNESS, OR WORSENING VISION (ESPECIALLY VISION THAT WA INITIALLY BETTER) CALL [phone] OR [phone] DURING BUSINESS HOURS OR AFTER HOURS.  General Anesthesia, Adult, Care After These instructions provide you with information about caring for yourself after your procedure. Your health care provider may also give you more specific instructions. Your treatment has been planned according to current medical practices, but problems sometimes occur. Call your health care provider if you have any problems or questions after your procedure. What can I expect after the procedure? After the procedure, it is common to have:  Vomiting.  A sore throat.  Mental slowness.  It is common to feel:  Nauseous.  Cold or shivery.  Sleepy.  Tired.  Sore or achy, even in parts of your body where you did not have surgery.  Follow these instructions at home: For at least 24 hours after the procedure:  Do not: ? Participate in activities where you could fall or become injured. ? Drive. ? Use heavy machinery. ? Drink alcohol. ? Take sleeping pills or medicines that cause drowsiness. ? Make important decisions or sign legal documents. ? Take care of children on your own.  Rest. Eating and drinking  If you vomit, drink water, juice, or soup when you can drink without  vomiting.  Drink enough fluid to keep your urine clear or pale yellow.  Make sure you have little or no nausea before eating solid foods.  Follow the diet recommended by your health care provider. General instructions  Have a responsible adult stay with you until you are awake and alert.  Return to your normal activities as told by your health care provider. Ask your health care provider what activities are safe for you.  Take over-the-counter and prescription medicines only as told by your health care provider.  If you smoke, do not smoke without supervision.  Keep all follow-up visits as told by your health care provider. This is important. Contact a health care provider if:  You continue to have nausea or vomiting at home, and medicines are not helpful.  You cannot drink fluids or start eating again.  You cannot urinate after 8-12 hours.  You develop a skin rash.  You have fever.  You have increasing redness at the site of your procedure. Get help right away if:  You have difficulty breathing.  You have chest pain.  You have unexpected bleeding.  You feel that you are having a life-threatening or urgent problem. This information is not intended to replace advice given to you by your health care provider. Make sure you discuss any questions you have with your health care provider. Document Released: 07/05/2000 Document Revised: 09/01/2015 Document Reviewed: 03/13/2015 Elsevier Interactive Patient Education  [employer].

## 2017-11-29 ENCOUNTER — Ambulatory Visit: Payer: Medicare Other | Admitting: Anesthesiology

## 2017-11-29 ENCOUNTER — Encounter
Admission: RE | Disposition: A | Discharge status: Home / Self Care | Payer: Self-pay | Source: Ambulatory Visit | Attending: Ophthalmology

## 2017-11-29 ENCOUNTER — Ambulatory Visit
Admission: RE | Admit: 2017-11-29 | Discharge: 2017-11-29 | Disposition: A | Discharge status: Home / Self Care | Payer: Medicare Other | Source: Ambulatory Visit | Attending: Ophthalmology | Admitting: Ophthalmology

## 2017-11-29 DIAGNOSIS — I7300 Raynaud's syndrome without gangrene: Secondary | ICD-10-CM | POA: Diagnosis not present

## 2017-11-29 DIAGNOSIS — I456 Pre-excitation syndrome: Secondary | ICD-10-CM | POA: Insufficient documentation

## 2017-11-29 DIAGNOSIS — I4891 Unspecified atrial fibrillation: Secondary | ICD-10-CM | POA: Diagnosis not present

## 2017-11-29 DIAGNOSIS — H02403 Unspecified ptosis of bilateral eyelids: Secondary | ICD-10-CM | POA: Insufficient documentation

## 2017-11-29 DIAGNOSIS — Z7901 Long term (current) use of anticoagulants: Secondary | ICD-10-CM | POA: Insufficient documentation

## 2017-11-29 DIAGNOSIS — Z87891 Personal history of nicotine dependence: Secondary | ICD-10-CM | POA: Insufficient documentation

## 2017-11-29 DIAGNOSIS — I2510 Atherosclerotic heart disease of native coronary artery without angina pectoris: Secondary | ICD-10-CM | POA: Diagnosis not present

## 2017-11-29 DIAGNOSIS — Z853 Personal history of malignant neoplasm of breast: Secondary | ICD-10-CM | POA: Diagnosis not present

## 2017-11-29 HISTORY — DX: Presence of dental prosthetic device (complete) (partial): Z97.2

## 2017-11-29 HISTORY — PX: PTOSIS REPAIR: SHX6568

## 2017-11-29 HISTORY — DX: Raynaud's syndrome without gangrene: I73.00

## 2017-11-29 SURGERY — REPAIR, BLEPHAROPTOSIS
Anesthesia: Monitor Anesthesia Care | Site: Eye | Laterality: Bilateral | Wound class: Clean

## 2017-11-29 MED ORDER — ONDANSETRON HCL 4 MG/2ML IJ SOLN: 4.0000 mg | Freq: Once | INTRAMUSCULAR | Status: DC | PRN

## 2017-11-29 MED ORDER — ALFENTANIL 500 MCG/ML IJ INJ
INJECTION | INTRAVENOUS | Status: DC | PRN
  Administered 2017-11-29: 400 ug via INTRAVENOUS

## 2017-11-29 MED ORDER — BSS IO SOLN
INTRAOCULAR | Status: DC | PRN
  Administered 2017-11-29: 15 mL

## 2017-11-29 MED ORDER — TETRACAINE HCL 0.5 % OP SOLN
OPHTHALMIC | Status: DC | PRN
  Administered 2017-11-29: 2 [drp] via OPHTHALMIC

## 2017-11-29 MED ORDER — ACETAMINOPHEN 325 MG PO TABS: 650.0000 mg | ORAL_TABLET | Freq: Once | ORAL | Status: DC | PRN

## 2017-11-29 MED ORDER — ERYTHROMYCIN 5 MG/GM OP OINT: TOPICAL_OINTMENT | OPHTHALMIC | 2 refills | Status: DC

## 2017-11-29 MED ORDER — MIDAZOLAM HCL 2 MG/2ML IJ SOLN
INTRAMUSCULAR | Status: DC | PRN
  Administered 2017-11-29 (×2): 0.5 mg via INTRAVENOUS

## 2017-11-29 MED ORDER — ERYTHROMYCIN 5 MG/GM OP OINT
TOPICAL_OINTMENT | OPHTHALMIC | Status: DC | PRN
  Administered 2017-11-29: 1 via OPHTHALMIC

## 2017-11-29 MED ORDER — ACETAMINOPHEN 160 MG/5ML PO SOLN: 325.0000 mg | ORAL | Status: DC | PRN

## 2017-11-29 MED ORDER — LACTATED RINGERS IV SOLN
INTRAVENOUS | Status: DC | PRN
  Administered 2017-11-29: 10:00:00 via INTRAVENOUS

## 2017-11-29 MED ORDER — LACTATED RINGERS IV SOLN: INTRAVENOUS | Status: DC

## 2017-11-29 MED ORDER — LIDOCAINE-EPINEPHRINE 2 %-1:100000 IJ SOLN
INTRAMUSCULAR | Status: DC | PRN
  Administered 2017-11-29: 1 mL via OPHTHALMIC

## 2017-11-29 MED ORDER — TRAMADOL HCL 50 MG PO TABS: ORAL_TABLET | ORAL | 0 refills | Status: DC

## 2017-11-29 SURGICAL SUPPLY — 34 items
APPLICATOR COTTON TIP WD 3 STR (MISCELLANEOUS) ×6 IMPLANT
BLADE SURG 15 STRL LF DISP TIS (BLADE) ×1 IMPLANT
BLADE SURG 15 STRL SS (BLADE) ×2
CORD BIP STRL DISP 12FT (MISCELLANEOUS) ×3 IMPLANT
DRAPE HEAD BAR (DRAPES) ×3 IMPLANT
GAUZE SPONGE 4X4 12PLY STRL (GAUZE/BANDAGES/DRESSINGS) ×3 IMPLANT
GAUZE SPONGE NON-WVN 2X2 STRL (MISCELLANEOUS) ×10 IMPLANT
GLOVE SURG LX 7.0 MICRO (GLOVE) ×4
GLOVE SURG LX STRL 7.0 MICRO (GLOVE) ×2 IMPLANT
MARKER SKIN XFINE TIP W/RULER (MISCELLANEOUS) ×3 IMPLANT
NEEDLE FILTER BLUNT 18X 1/2SAF (NEEDLE) ×2
NEEDLE FILTER BLUNT 18X1 1/2 (NEEDLE) ×1 IMPLANT
NEEDLE HYPO 30X.5 LL (NEEDLE) ×6 IMPLANT
PACK DRAPE NASAL/ENT (PACKS) ×3 IMPLANT
SOL PREP PVP 2OZ (MISCELLANEOUS) ×3
SOLUTION PREP PVP 2OZ (MISCELLANEOUS) ×1 IMPLANT
SPONGE VERSALON 2X2 STRL (MISCELLANEOUS) ×20
SUT CHROMIC 4-0 (SUTURE)
SUT CHROMIC 4-0 M2 12X2 ARM (SUTURE)
SUT CHROMIC 5 0 P 3 (SUTURE) IMPLANT
SUT ETHILON 4 0 CL P 3 (SUTURE) IMPLANT
SUT MERSILENE 4-0 S-2 (SUTURE) IMPLANT
SUT PDS AB 4-0 P3 18 (SUTURE) IMPLANT
SUT PLAIN GUT (SUTURE) ×3 IMPLANT
SUT PROLENE 5 0 P 3 (SUTURE) ×3 IMPLANT
SUT PROLENE 6 0 P 1 18 (SUTURE) ×3 IMPLANT
SUT SILK 4 0 G 3 (SUTURE) IMPLANT
SUT VIC AB 5-0 P-3 18X BRD (SUTURE) IMPLANT
SUT VIC AB 5-0 P3 18 (SUTURE)
SUT VICRYL 7 0 TG140 8 (SUTURE) IMPLANT
SUTURE CHRMC 4-0 M2 12X2 ARM (SUTURE) IMPLANT
SYR 10ML LL (SYRINGE) ×3 IMPLANT
SYR 3ML LL SCALE MARK (SYRINGE) ×3 IMPLANT
WATER STERILE IRR 250ML POUR (IV SOLUTION) ×3 IMPLANT

## 2017-11-29 NOTE — Anesthesia Postprocedure Evaluation (Signed)
Anesthesia Post Note  Patient: Sarah Li  Procedure(s) Performed: BLEPHAROPTOSIS REPAIR RESECT EX (Bilateral Eye)  Patient location during evaluation: PACU Anesthesia Type: MAC Level of consciousness: awake and alert, oriented and patient cooperative Pain management: pain level controlled Vital Signs Assessment: post-procedure vital signs reviewed and stable Respiratory status: spontaneous breathing, nonlabored ventilation and respiratory function stable Cardiovascular status: blood pressure returned to baseline and stable Postop Assessment: adequate PO intake Anesthetic complications: no    Darrin Nipper

## 2017-11-29 NOTE — Op Note (Signed)
Preoperative Diagnosis:   Visually significant blepharoptosis bilateral upper Eyelid(s)   Postoperative Diagnosis:  Same.  Procedure(s) Performed:   Blepharoptosis repair with levator aponeurosis advancement bilateral upper Eyelid(s)   Surgeon: Philis Pique. Vickki Muff, M.D.  Assistants: none  Anesthesia: MAC  Specimens: None.  Estimated Blood Loss: Minimal.  Complications: None.  Operative Findings: None Dictated  Procedure:   Allergies were reviewed and the patient Crestor [rosuvastatin calcium] and Lipitor [atorvastatin].   After the risks, benefits, complications and alternatives were discussed with the patient, appropriate informed consent was obtained.  While seated in an upright position and looking in primary gaze, the mid pupillary line was marked on the upper eyelid margins bilaterally. The patient was then brought to the operating suite and reclined supine.  Timeout was conducted and the patient was sedated.  Local anesthetic consisting of a 50-50 mixture of 2% lidocaine with epinephrine and 0.75% bupivacaine with added Hylenex was injected subcutaneously to both upper eyelid(s). After adequate local was instilled, the patient was prepped and draped in the usual sterile fashion for eyelid surgery.   Attention was turned to the upper eyelids. A 65m upper eyelid crease incision line was marked with calipers on both upper eyelid(s).  A pinch test was used to estimate the amount of excess skin to remove and this was marked in standard blepharoplasty style fashion. Attention was turned to the right upper eyelid. A #15 blade was used to open the premarked incision line. A skin only flap was excised and hemostasis was obtained with bipolar cautery.   [place] scissors were then used to transect through orbicularis down to the tarsal plate. Epitarsus was dissected to create a smooth surface to suture to. Dissection was then carried superiorly in the plane between orbicularis and orbital  septum. Once the preaponeurotic fat pocket was identified, the orbital septum was opened. This revealed the levator and its aponeurosis.    Attention was then turned to the opposite eyelid where the same procedure was performed in the same manner. Hemostasis was obtained with bipolar cautery throughout.   3 interrupted 6-0 Prolene sutures were then passed partial thickness through the tarsal plates of both upper eyelid(s). These sutures were placed in line with the mid pupillary, medial limbal, and lateral limbal lines. The sutures were fixed to the levator aponeurosis and adjusted until a nice lid height and contour were achieved. Once nice symmetry was achieved, the skin incisions were closed with a running 6-0 fast absorbing plain suture. The patient tolerated the procedure well.  Erythromycin ophthalmic ointment was applied to the incision site(s) followed by ice packs. The patient was taken to the recovery area where she recovered without difficulty.  Post-Op Plan/Instructions:  The patient was instructed to use ice packs frequently for the next 48 hours. She was instructed to use erythromycin ophthalmic ointment on her incisions 4 times a day for the next 12 to 14 days. Shewas given a prescription for Percocet for pain control should Tylenol not be effective. [facility] [place] or sooner as needed for problems.  Sarah Li M. FVickki Muff M.D. Attending,Ophthalmology

## 2017-11-29 NOTE — Anesthesia Procedure Notes (Signed)
Procedure Name: MAC Date/Time: 11/29/2017 10:23 AM Performed by: Lind Guest, CRNA Pre-anesthesia Checklist: Patient identified, Emergency Drugs available, Suction available, Patient being monitored and Timeout performed Patient Re-evaluated:Patient Re-evaluated prior to induction Oxygen Delivery Method: Nasal cannula

## 2017-11-29 NOTE — Interval H&P Note (Signed)
History and Physical Interval Note:  11/29/2017 9:59 AM  Sarah Li  has presented today for surgery, with the diagnosis of H02.403 PTOSIS OF EYELID UNSPECIFIED  The various methods of treatment have been discussed with the patient and family. After consideration of risks, benefits and other options for treatment, the patient has consented to  Procedure(s): BLEPHAROPTOSIS REPAIR RESECT EX (Bilateral) as a surgical intervention .  The patient's history has been reviewed, patient examined, no change in status, stable for surgery.  I have reviewed the patient's chart and labs.  Questions were answered to the patient's satisfaction.     Vickki Muff, Voncille Simm M

## 2017-11-29 NOTE — Transfer of Care (Signed)
Immediate Anesthesia Transfer of Care Note  Patient: Sarah Li  Procedure(s) Performed: BLEPHAROPTOSIS REPAIR RESECT EX (Bilateral Eye)  Patient Location: PACU  Anesthesia Type: MAC  Level of Consciousness: awake, alert  and patient cooperative  Airway and Oxygen Therapy: Patient Spontanous Breathing and Patient connected to supplemental oxygen  Post-op Assessment: Post-op Vital signs reviewed, Patient's Cardiovascular Status Stable, Respiratory Function Stable, Patent Airway and No signs of Nausea or vomiting  Post-op Vital Signs: Reviewed and stable  Complications: No apparent anesthesia complications

## 2017-11-29 NOTE — H&P (Signed)
  See the history and physical completed at [facility] on 11/16/17 and scanned into the chart.

## 2017-11-30 ENCOUNTER — Encounter: Payer: Self-pay | Admitting: Ophthalmology

## 2018-05-31 ENCOUNTER — Other Ambulatory Visit: Payer: Self-pay | Admitting: Internal Medicine

## 2018-05-31 DIAGNOSIS — Z1231 Encounter for screening mammogram for malignant neoplasm of breast: Secondary | ICD-10-CM

## 2018-06-09 ENCOUNTER — Ambulatory Visit
Admission: RE | Admit: 2018-06-09 | Discharge: 2018-06-09 | Disposition: A | Discharge status: Home / Self Care | Payer: Medicare Other | Source: Ambulatory Visit | Attending: Internal Medicine | Admitting: Internal Medicine

## 2018-06-09 DIAGNOSIS — Z1231 Encounter for screening mammogram for malignant neoplasm of breast: Secondary | ICD-10-CM | POA: Insufficient documentation

## 2018-06-09 IMAGING — MG DIGITAL SCREENING BILATERAL MAMMOGRAM WITH TOMO AND CAD
6 of 12 series · 6 of 36 positions shown · non-contrast
Comparison: None.

CLINICAL DATA: Screening.

EXAM:
DIGITAL SCREENING BILATERAL MAMMOGRAM WITH TOMO AND CAD

[R CC synth-2D]
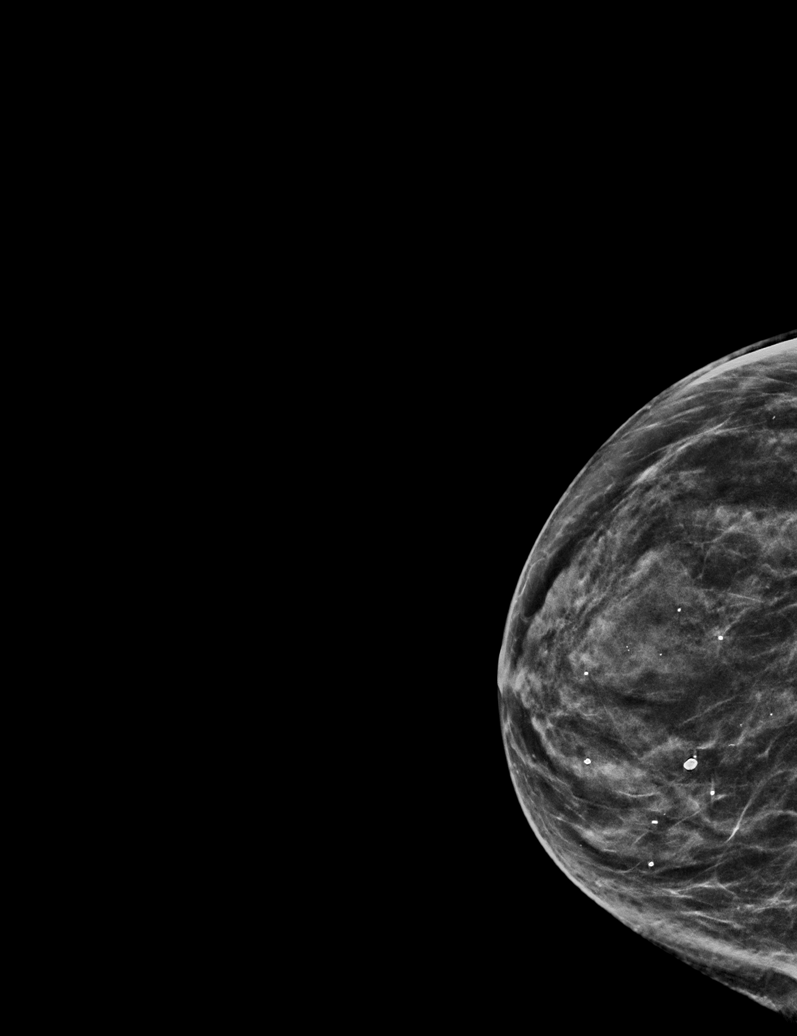

[R MLO synth-2D (1 of 2)]
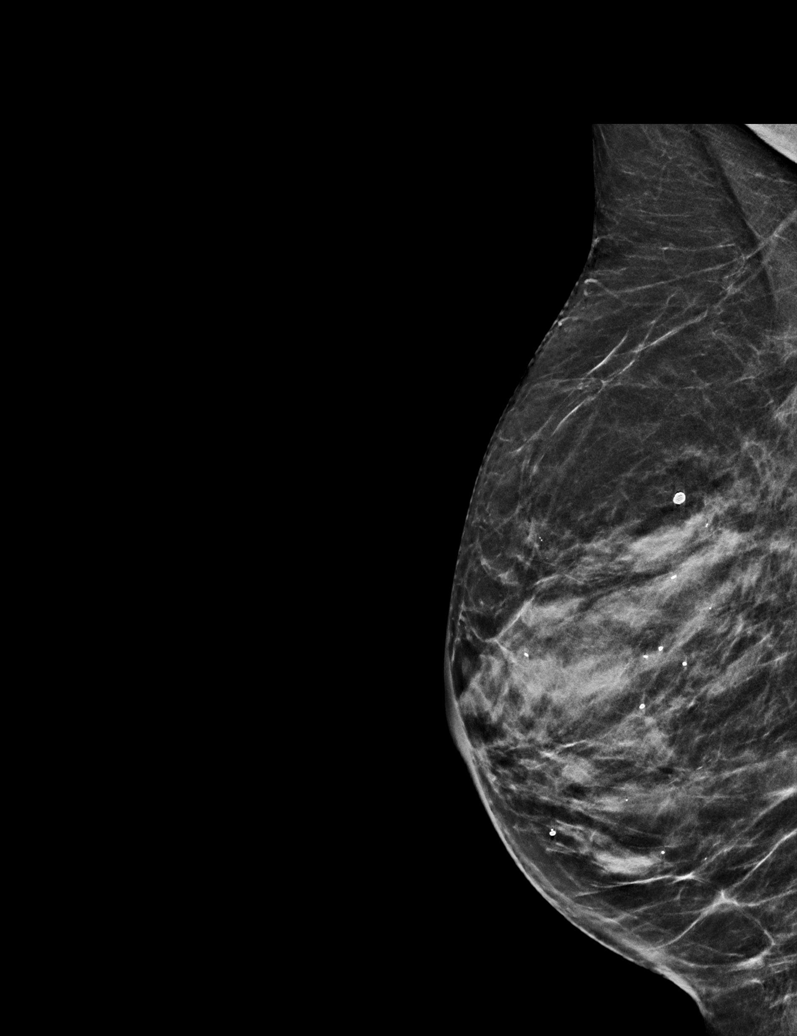

[L MLO synth-2D (1 of 2)]
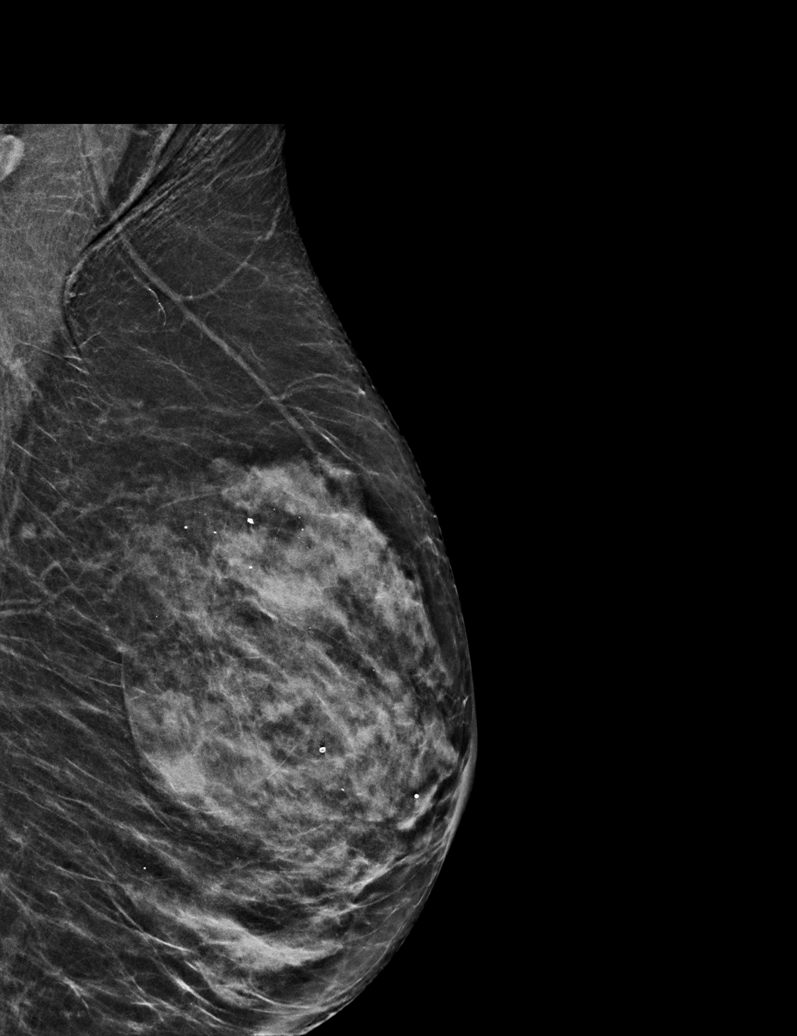

[R MLO synth-2D (2 of 2)]
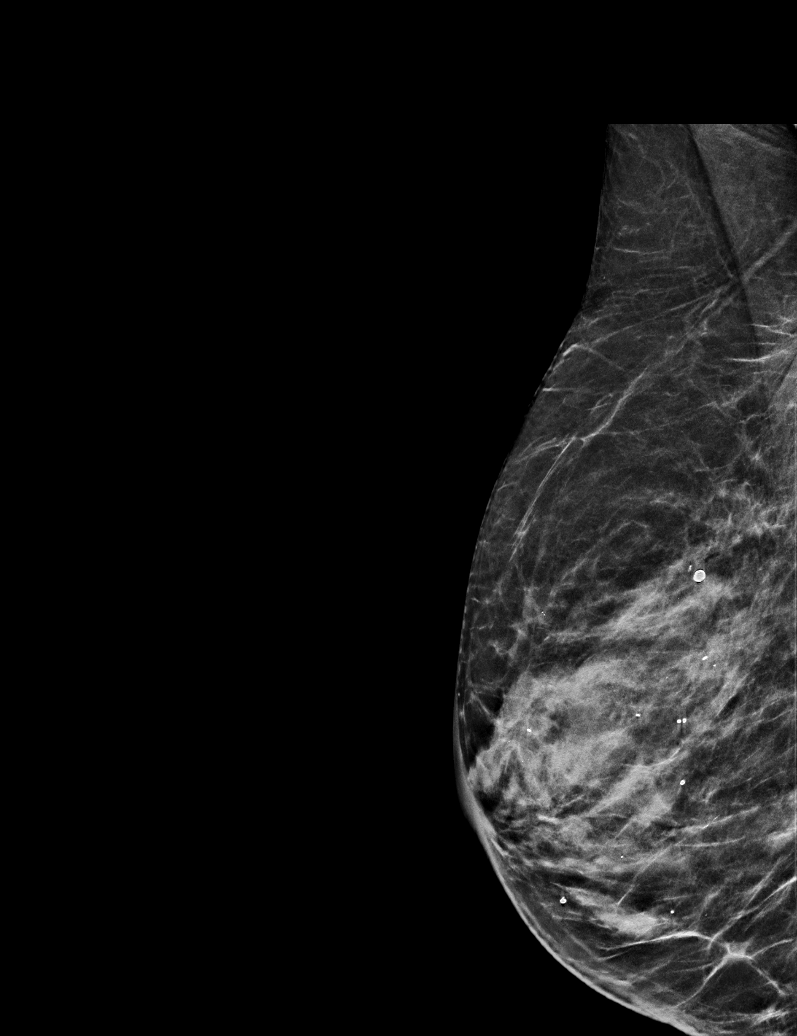

[L CC synth-2D]
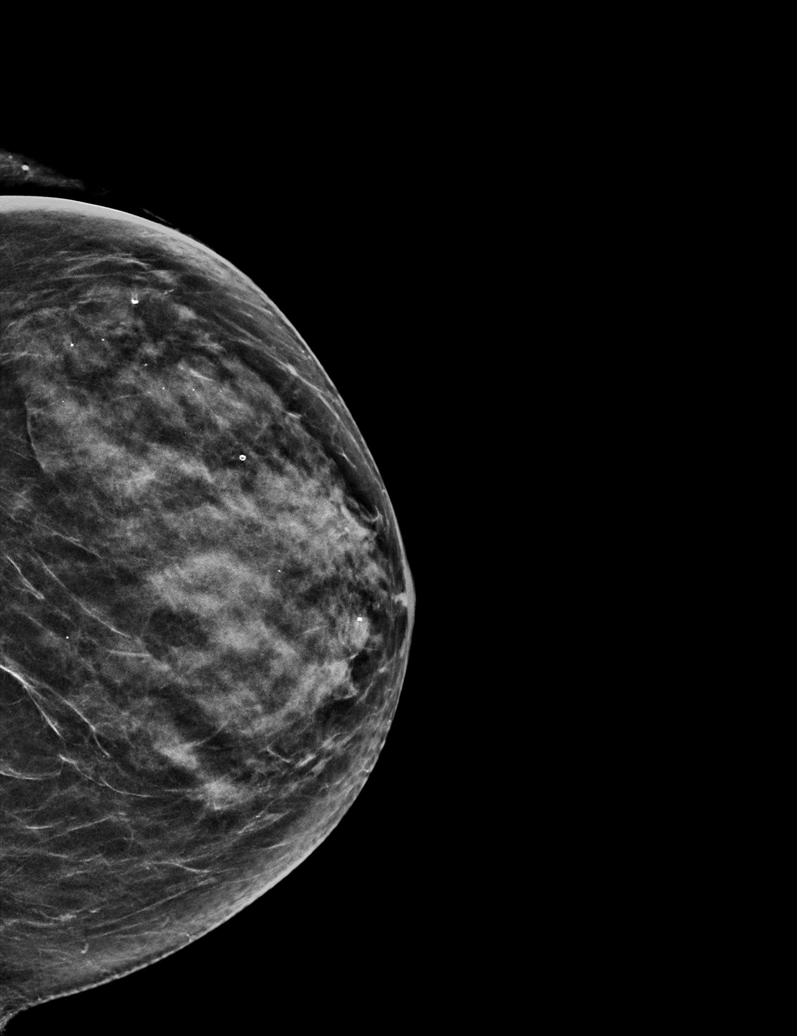

[L MLO synth-2D (2 of 2)]
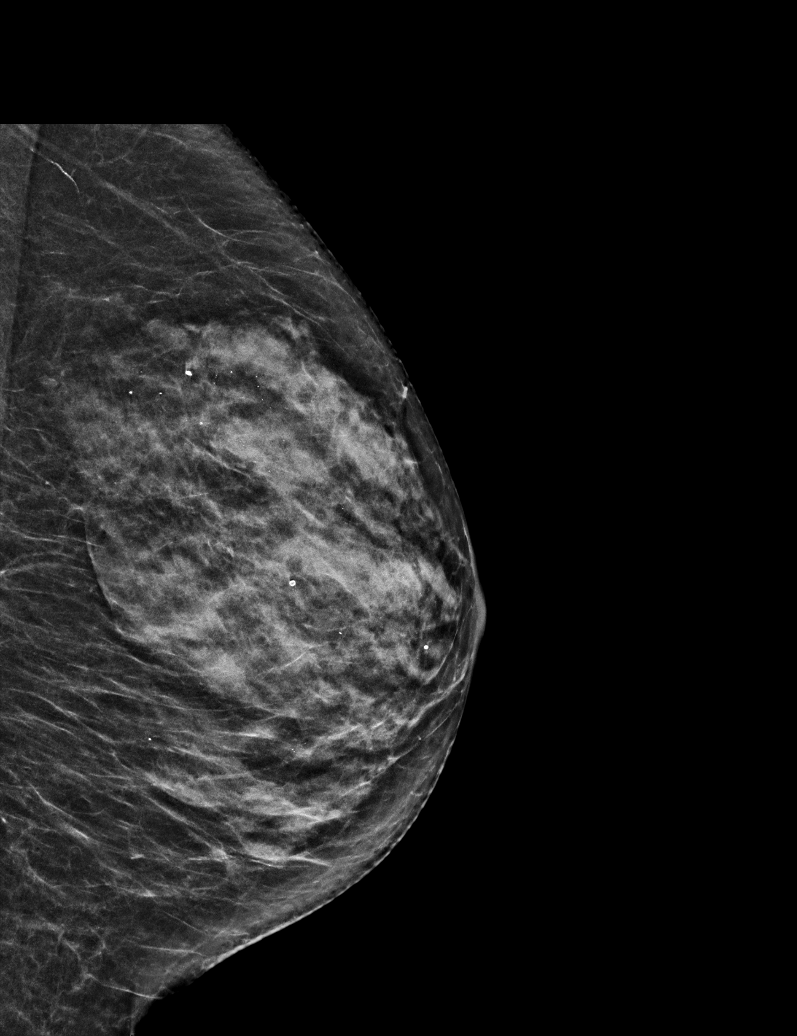

[6 of 36 positions shown; findings below may reference images not displayed]

ACR Breast Density Category c: The breast tissue is heterogeneously
dense, which may obscure small masses
FINDINGS: There are no findings suspicious for malignancy. Images were
processed with CAD.
IMPRESSION: No mammographic evidence of malignancy. A result letter of this
screening mammogram will be mailed directly to the patient.

RECOMMENDATION:
Screening mammogram in one year. (Code:[4W])

BI-RADS CATEGORY  1: Negative.

## 2018-08-31 ENCOUNTER — Telehealth: Payer: Self-pay

## 2018-08-31 NOTE — Telephone Encounter (Signed)
Called patient to change to Evisit.  Patient does not want to do a telephone or video visit.  Patient would like to wait for an in office visit.

## 2018-09-18 ENCOUNTER — Ambulatory Visit: Payer: Medicare Other | Admitting: Cardiovascular Disease

## 2018-10-13 ENCOUNTER — Other Ambulatory Visit: Payer: Self-pay | Admitting: Cardiovascular Disease

## 2019-05-08 ENCOUNTER — Other Ambulatory Visit: Payer: Self-pay

## 2019-05-08 ENCOUNTER — Other Ambulatory Visit
Admission: RE | Admit: 2019-05-08 | Discharge: 2019-05-08 | Disposition: A | Discharge status: Home / Self Care | Payer: Medicare Other | Source: Ambulatory Visit | Attending: Internal Medicine | Admitting: Internal Medicine

## 2019-05-08 DIAGNOSIS — Z01812 Encounter for preprocedural laboratory examination: Secondary | ICD-10-CM | POA: Diagnosis present

## 2019-05-08 DIAGNOSIS — Z20822 Contact with and (suspected) exposure to covid-19: Secondary | ICD-10-CM | POA: Insufficient documentation

## 2019-05-09 LAB — SARS CORONAVIRUS 2 (TAT 6-24 HRS): SARS Coronavirus 2: NEGATIVE

## 2019-05-10 ENCOUNTER — Other Ambulatory Visit: Payer: Self-pay

## 2019-05-10 ENCOUNTER — Ambulatory Visit: Payer: Medicare Other | Admitting: Anesthesiology

## 2019-05-10 ENCOUNTER — Encounter
Admission: RE | Disposition: A | Discharge status: Home / Self Care | Payer: Self-pay | Source: Home / Self Care | Attending: Internal Medicine

## 2019-05-10 ENCOUNTER — Ambulatory Visit
Admission: RE | Admit: 2019-05-10 | Discharge: 2019-05-10 | Disposition: A | Discharge status: Home / Self Care | Payer: Medicare Other | Attending: Internal Medicine | Admitting: Internal Medicine

## 2019-05-10 ENCOUNTER — Encounter: Payer: Self-pay | Admitting: Internal Medicine

## 2019-05-10 DIAGNOSIS — I10 Essential (primary) hypertension: Secondary | ICD-10-CM | POA: Diagnosis not present

## 2019-05-10 DIAGNOSIS — E785 Hyperlipidemia, unspecified: Secondary | ICD-10-CM | POA: Diagnosis not present

## 2019-05-10 DIAGNOSIS — I2510 Atherosclerotic heart disease of native coronary artery without angina pectoris: Secondary | ICD-10-CM | POA: Diagnosis not present

## 2019-05-10 DIAGNOSIS — Z79899 Other long term (current) drug therapy: Secondary | ICD-10-CM | POA: Diagnosis not present

## 2019-05-10 DIAGNOSIS — Z853 Personal history of malignant neoplasm of breast: Secondary | ICD-10-CM | POA: Diagnosis not present

## 2019-05-10 DIAGNOSIS — R0602 Shortness of breath: Secondary | ICD-10-CM | POA: Diagnosis present

## 2019-05-10 DIAGNOSIS — Z87891 Personal history of nicotine dependence: Secondary | ICD-10-CM | POA: Insufficient documentation

## 2019-05-10 DIAGNOSIS — Z7901 Long term (current) use of anticoagulants: Secondary | ICD-10-CM | POA: Diagnosis not present

## 2019-05-10 DIAGNOSIS — I480 Paroxysmal atrial fibrillation: Secondary | ICD-10-CM | POA: Insufficient documentation

## 2019-05-10 HISTORY — PX: CARDIOVERSION: SHX1299

## 2019-05-10 SURGERY — CARDIOVERSION
Anesthesia: General

## 2019-05-10 MED ORDER — SODIUM CHLORIDE 0.9 % IV SOLN: INTRAVENOUS | Status: DC | PRN

## 2019-05-10 MED ORDER — PROPOFOL 10 MG/ML IV BOLUS
INTRAVENOUS | Status: DC | PRN
  Administered 2019-05-10: 40 ug via INTRAVENOUS
  Administered 2019-05-10: 10 ug via INTRAVENOUS

## 2019-05-10 MED ORDER — AMIODARONE HCL 200 MG PO TABS: 200.0000 mg | ORAL_TABLET | Freq: Every day | ORAL | 6 refills | Status: AC

## 2019-05-10 MED ORDER — EPHEDRINE SULFATE 50 MG/ML IJ SOLN
INTRAMUSCULAR | Status: DC | PRN
  Administered 2019-05-10: 5 mg via INTRAVENOUS
  Administered 2019-05-10: 10 mg via INTRAVENOUS

## 2019-05-10 NOTE — Anesthesia Postprocedure Evaluation (Signed)
Anesthesia Post Note  Patient: Sarah Li  Procedure(s) Performed: CARDIOVERSION (N/A )  Patient location during evaluation: PACU Anesthesia Type: General Level of consciousness: awake and alert Pain management: pain level controlled Vital Signs Assessment: post-procedure vital signs reviewed and stable Respiratory status: spontaneous breathing and respiratory function stable Cardiovascular status: stable Anesthetic complications: no     Last Vitals:  Vitals:   05/10/19 0744  BP: 133/86  Pulse: 76  Resp: 19  Temp: 36.4 C  SpO2: 98%    Last Pain:  Vitals:   05/10/19 0744  TempSrc: Oral  PainSc: 0-No pain                 Shevawn Langenberg K

## 2019-05-10 NOTE — Transfer of Care (Signed)
Immediate Anesthesia Transfer of Care Note  Patient: Sarah Li  Procedure(s) Performed: CARDIOVERSION (N/A )  Patient Location: PACU  Anesthesia Type:General  Level of Consciousness: awake, alert  and oriented  Airway & Oxygen Therapy: Patient Spontanous Breathing  Post-op Assessment: Report given to RN and Post -op Vital signs reviewed and stable  Post vital signs: Reviewed and stable  Last Vitals:  Vitals Value Taken Time  BP    Temp    Pulse    Resp    SpO2      Last Pain:  Vitals:   05/10/19 0744  TempSrc: Oral  PainSc: 0-No pain         Complications: No apparent anesthesia complications

## 2019-05-10 NOTE — Anesthesia Preprocedure Evaluation (Signed)
Anesthesia Evaluation  Patient identified by MRN, date of birth, ID band Patient awake    Reviewed: Allergy & Precautions, NPO status , Patient's Chart, lab work & pertinent test results  History of Anesthesia Complications Negative for: history of anesthetic complications  Airway Mallampati: II       Dental   Pulmonary neg sleep apnea, neg COPD, Not current smoker, former smoker,           Cardiovascular (-) hypertension(-) Past MI + dysrhythmias Atrial Fibrillation (-) Valvular Problems/Murmurs     Neuro/Psych neg Seizures Depression    GI/Hepatic Neg liver ROS, GERD  Controlled,  Endo/Other  neg diabetes  Renal/GU negative Renal ROS     Musculoskeletal   Abdominal   Peds  Hematology   Anesthesia Other Findings   Reproductive/Obstetrics                             Anesthesia Physical Anesthesia Plan  ASA: III  Anesthesia Plan: General   Post-op Pain Management:    Induction: Intravenous  PONV Risk Score and Plan: 3 and Propofol infusion, TIVA and Treatment may vary due to age or medical condition  Airway Management Planned: Nasal Cannula  Additional Equipment:   Intra-op Plan:   Post-operative Plan:   Informed Consent: I have reviewed the patients History and Physical, chart, labs and discussed the procedure including the risks, benefits and alternatives for the proposed anesthesia with the patient or authorized representative who has indicated his/her understanding and acceptance.       Plan Discussed with:   Anesthesia Plan Comments:         Anesthesia Quick Evaluation

## 2019-05-10 NOTE — CV Procedure (Signed)
Electrical Cardioversion Procedure Note Sarah Li [id number] Aug 19, 1940  Procedure: Electrical Cardioversion Indications:  Paroxysmal non valvular atrial fibrillation  Procedure Details Consent: Risks of procedure as well as the alternatives and risks of each were explained to the (patient/caregiver).  Consent for procedure obtained. Time Out: Verified patient identification, verified procedure, site/side was marked, verified correct patient position, special equipment/implants available, medications/allergies/relevent history reviewed, required imaging and test results available.  Performed  Patient placed on cardiac monitor, pulse oximetry, supplemental oxygen as necessary.  Sedation given: Propofol and versed as per anesthesia  Pacer pads placed anterior and posterior chest.  Cardioverted 1 time(s).  Cardioverted at 120J.  Evaluation Findings: Post procedure EKG shows: NSR Complications: None Patient did tolerate procedure well.   Sarah Li M.D. [facility] 05/10/2019, 8:43 AM

## 2019-05-10 NOTE — Discharge Instructions (Signed)
Moderate Conscious Sedation, Adult, Care After These instructions provide you with information about caring for yourself after your procedure. Your health care provider may also give you more specific instructions. Your treatment has been planned according to current medical practices, but problems sometimes occur. Call your health care provider if you have any problems or questions after your procedure. What can I expect after the procedure? After your procedure, it is common:  To feel sleepy for several hours.  To feel clumsy and have poor balance for several hours.  To have poor judgment for several hours.  To vomit if you eat too soon. Follow these instructions at home: For at least 24 hours after the procedure:   Do not: ? Participate in activities where you could fall or become injured. ? Drive. ? Use heavy machinery. ? Drink alcohol. ? Take sleeping pills or medicines that cause drowsiness. ? Make important decisions or sign legal documents. ? Take care of children on your own.  Rest. Eating and drinking  Follow the diet recommended by your health care provider.  If you vomit: ? Drink water, juice, or soup when you can drink without vomiting. ? Make sure you have little or no nausea before eating solid foods. General instructions  Have a responsible adult stay with you until you are awake and alert.  Take over-the-counter and prescription medicines only as told by your health care provider.  If you smoke, do not smoke without supervision.  Keep all follow-up visits as told by your health care provider. This is important. Contact a health care provider if:  You keep feeling nauseous or you keep vomiting.  You feel light-headed.  You develop a rash.  You have a fever. Get help right away if:  You have trouble breathing. This information is not intended to replace advice given to you by your health care provider. Make sure you discuss any questions you have  with your health care provider. Document Revised: 03/11/2017 Document Reviewed: 07/19/2015 Elsevier Patient Education  [place] After This sheet gives you information about how to care for yourself after your procedure. Your health care provider may also give you more specific instructions. If you have problems or questions, contact your health care provider. What can I expect after the procedure? After the procedure, it is common to have:  Tiredness (fatigue).  Mild throat discomfort, if you had a test to look for blood clots in your heart (transesophageal echocardiogram, TEE). Follow these instructions at home: Medicines  Take over-the-counter and prescription medicines only as told by your health care provider. You may need to take blood thinners (anticoagulants) or medicines to control your heart rhythm.  If you are taking blood thinners: ? Talk with your health care provider before you take any medicines that contain aspirin or NSAIDs, such as ibuprofen. These medicines increase your risk for dangerous bleeding. ? Take your medicine exactly as told, at the same time every day. ? Avoid activities that could cause injury or bruising, and follow instructions about how to prevent falls. ? Wear a medical alert bracelet or carry a card that lists what medicines you take. Eating and drinking   Follow instructions from your health care provider about eating and drinking restrictions. You may have to follow a low-salt (low-sodium), low-fat, and low-cholesterol diet.  Drink enough fluid to keep your urine pale yellow.  Do not drink alcohol if: ? Your health care provider tells you not to drink. ? You are  pregnant, may be pregnant, or are planning to become pregnant.  If you drink alcohol: ? Limit how much you use to:  0-1 drink a day for women.  0-2 drinks a day for men. ? Be aware of how much alcohol is in your drink. In the [place] one drink  equals one 12 oz bottle of beer (355 mL), one 5 oz glass of wine (148 mL), or one 1 oz glass of hard liquor (44 mL). Activity  Ask your health care provider what activities are safe for you.  Do not drive for 24 hours if you were given a sedative during your procedure. General instructions  Do not use any products that contain nicotine or tobacco, such as cigarettes, e-cigarettes, and chewing tobacco. If you need help quitting, ask your health care provider.  Keep all follow-up visits as told by your health care provider. This is important. Contact a health care provider if you have:  A fever.  Severe pain, and medicines do not help.  Problems taking your medicines.  Irregular heartbeats. Get help right away if:   Your heartbeat becomes very fast or slow.  You have chest pain or shortness of breath.  You feel dizzy.  You faint.  You have any symptoms of a stroke. "BE FAST" is an easy way to remember the main warning signs of a stroke: ? B - Balance. Signs are dizziness, sudden trouble walking, or loss of balance. ? E - Eyes. Signs are trouble seeing or a sudden change in vision. ? F - Face. Signs are sudden weakness or numbness of the face, or the face or eyelid drooping on one side. ? A - Arms. Signs are weakness or numbness in an arm. This happens suddenly and usually on one side of the body. ? S - Speech. Signs are sudden trouble speaking, slurred speech, or trouble understanding what people say. ? T - Time. Time to call emergency services. Write down what time symptoms started.  You have other signs of a stroke, such as: ? A sudden, severe headache with no known cause. ? Nausea or vomiting. ? Seizure. These symptoms may represent a serious problem that is an emergency. Do not wait to see if the symptoms will go away. Get medical help right away. Call your local emergency services (911 in the [place] Do not drive yourself to the hospital. Summary  Some fatigue is  common after this procedure.  You may have to take blood thinners (anticoagulants) or medicines to control your heart rhythm.  If you have symptoms of a stroke, get help right away. "BE FAST" is an easy way to remember the main warning signs of a stroke. This information is not intended to replace advice given to you by your health care provider. Make sure you discuss any questions you have with your health care provider. Document Revised: 10/20/2018 Document Reviewed: 10/20/2018 Elsevier Patient Education  [place].

## 2019-07-11 ENCOUNTER — Other Ambulatory Visit: Payer: Self-pay | Admitting: Internal Medicine

## 2019-07-11 DIAGNOSIS — Z1231 Encounter for screening mammogram for malignant neoplasm of breast: Secondary | ICD-10-CM

## 2019-07-26 ENCOUNTER — Ambulatory Visit
Admission: RE | Admit: 2019-07-26 | Discharge: 2019-07-26 | Disposition: A | Discharge status: Home / Self Care | Payer: Medicare Other | Source: Ambulatory Visit | Attending: Internal Medicine | Admitting: Internal Medicine

## 2019-07-26 DIAGNOSIS — Z1231 Encounter for screening mammogram for malignant neoplasm of breast: Secondary | ICD-10-CM | POA: Diagnosis present

## 2019-07-26 HISTORY — DX: Personal history of irradiation: Z92.3

## 2019-07-26 IMAGING — MG DIGITAL SCREENING BILAT W/ TOMO W/ CAD
8 series · 9 of 24 positions shown · non-contrast
Comparison: Previous exam(s).

CLINICAL DATA: Screening.

EXAM:
DIGITAL SCREENING BILATERAL MAMMOGRAM WITH TOMO AND CAD

[L MLO synth-2D]
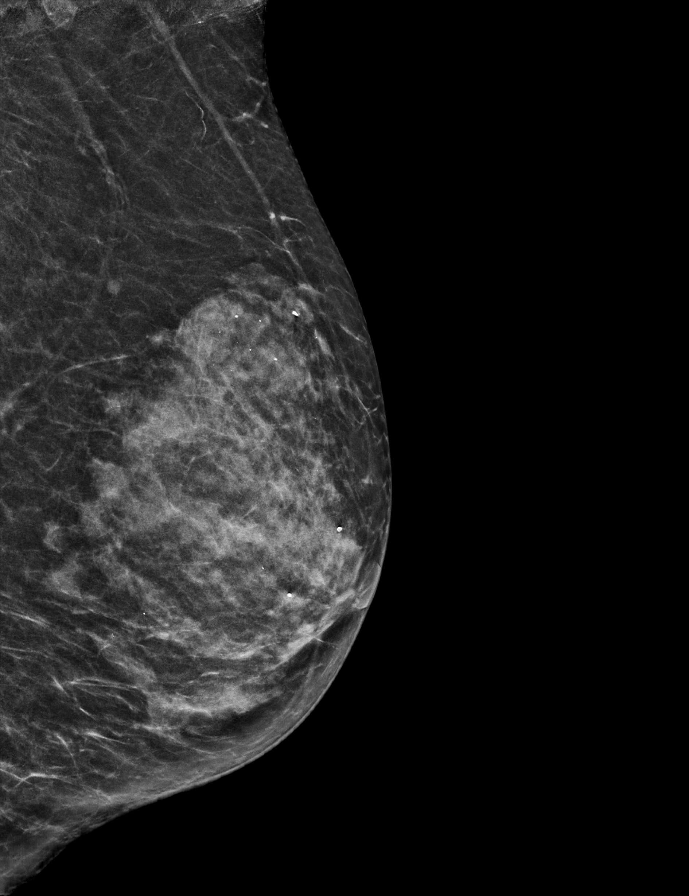

[L CC synth-2D]
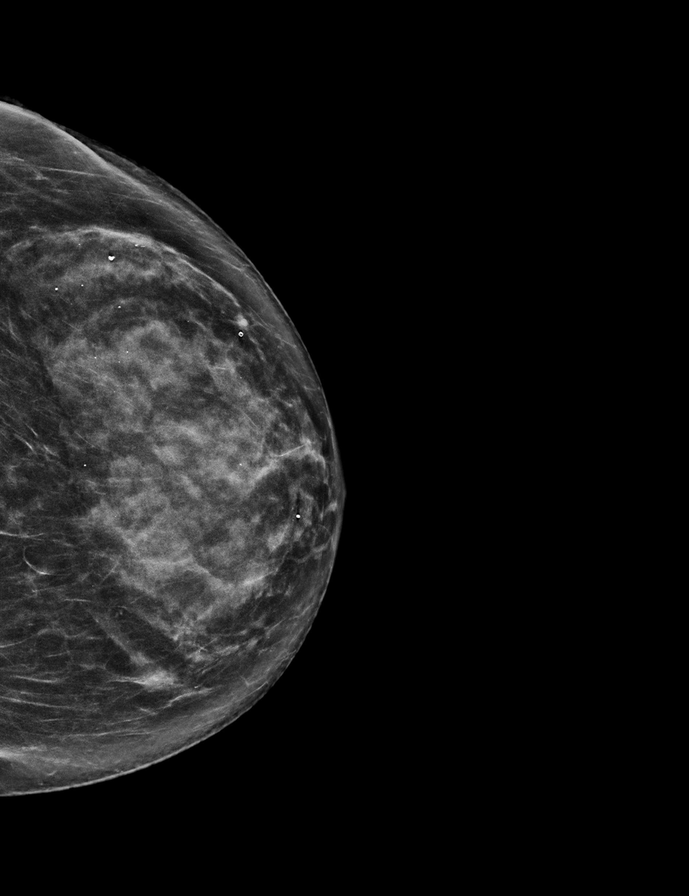

[R CC synth-2D]
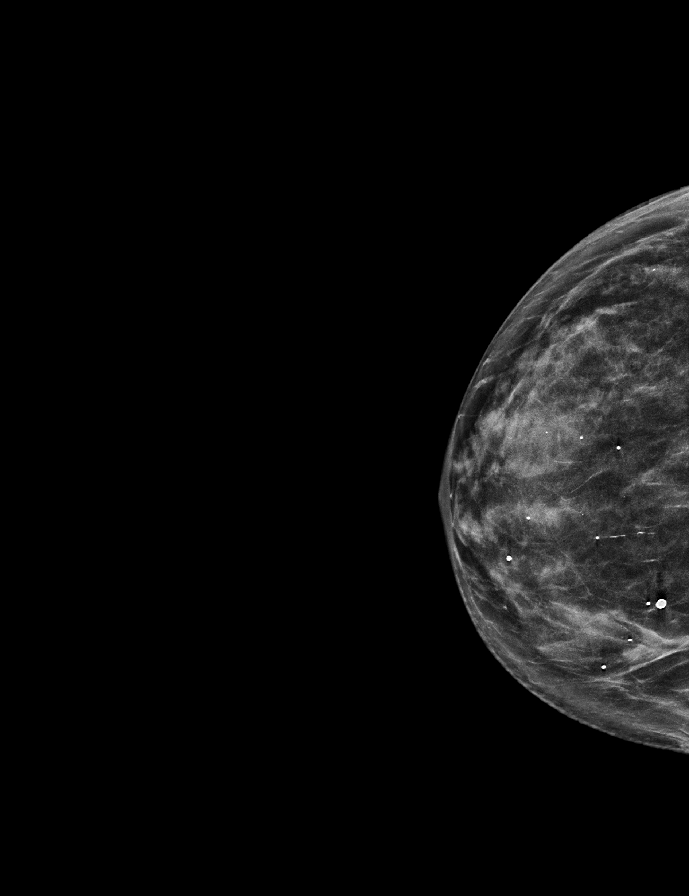

[R MLO synth-2D]
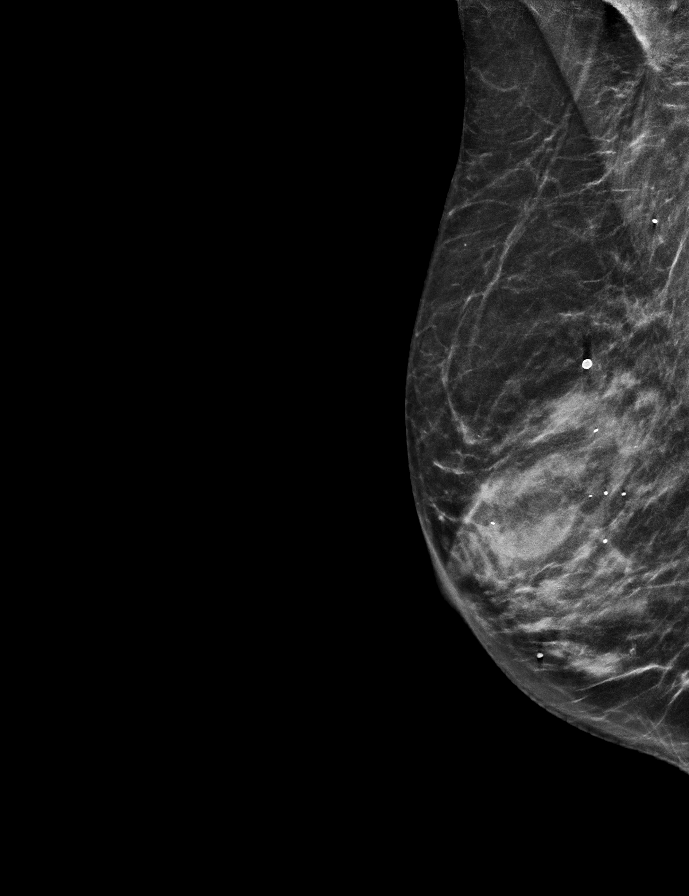

[L CC tomo · 2 of 61 frames shown]
[frame 20/61]
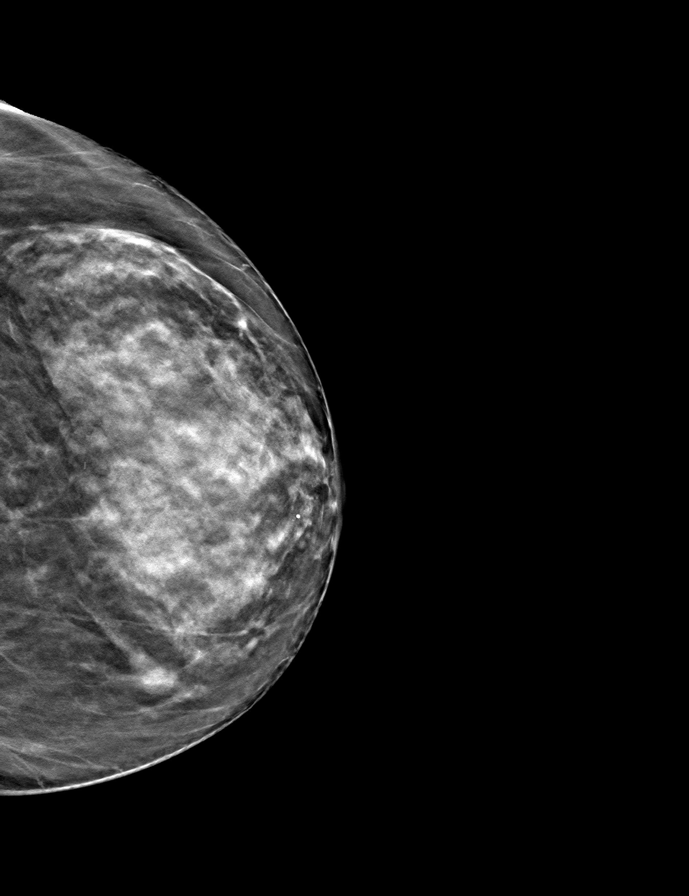
[frame 31/61]
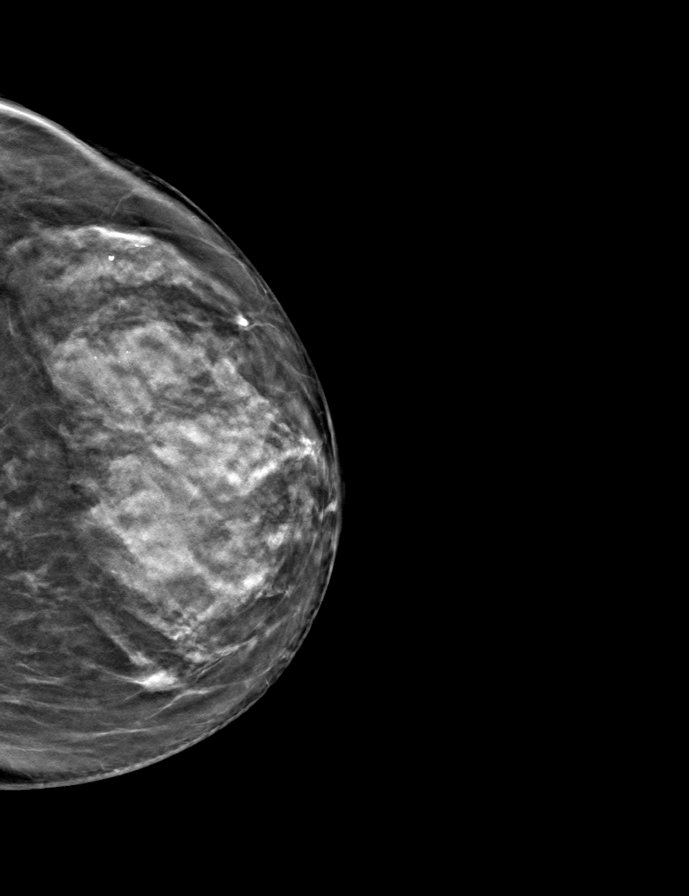

[R MLO tomo · tomo slice 29/56.0]
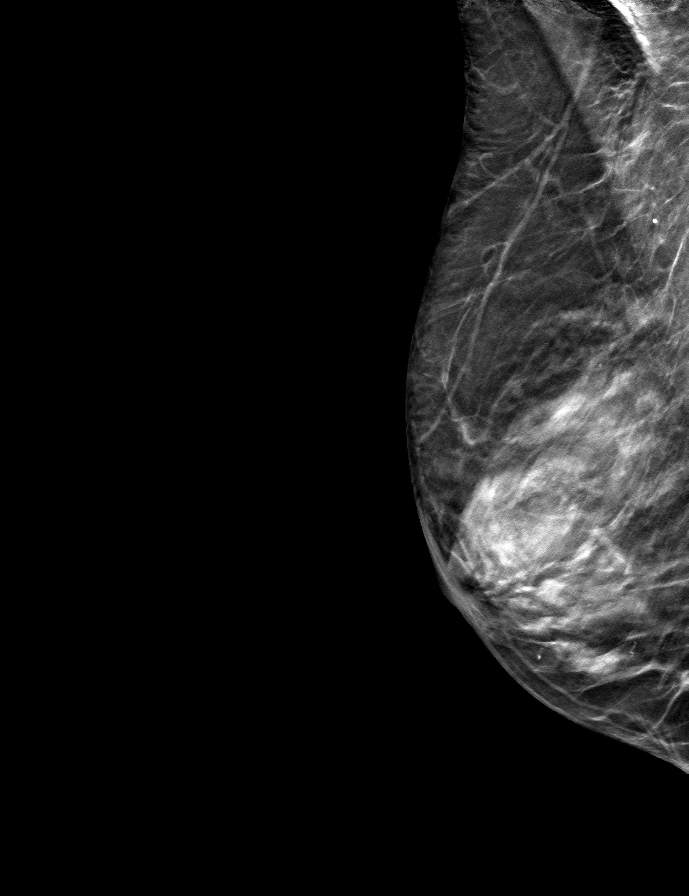

[L MLO tomo · tomo slice 26/51.0]
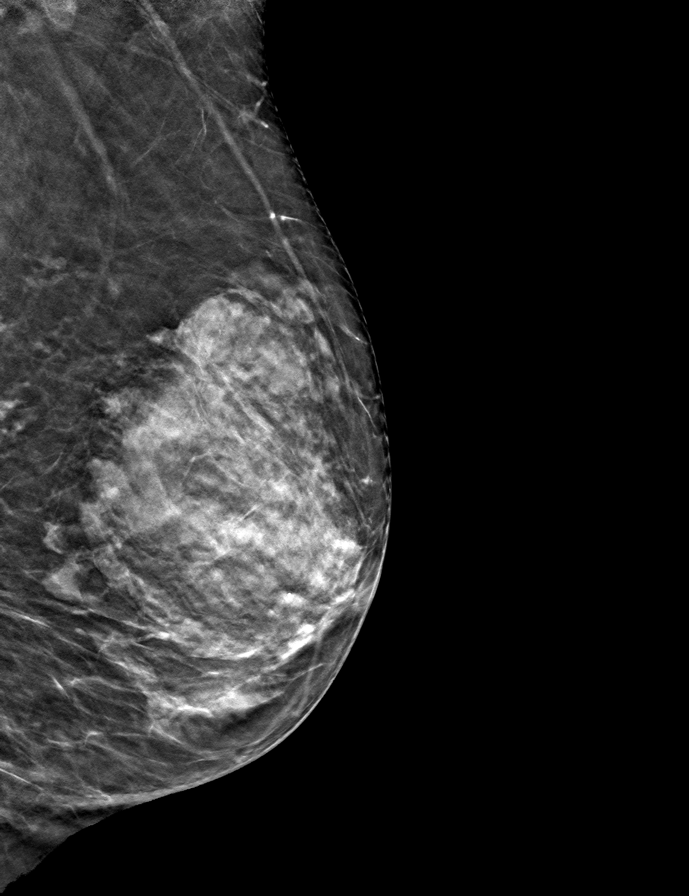

[R CC tomo · tomo slice 29/56.0]
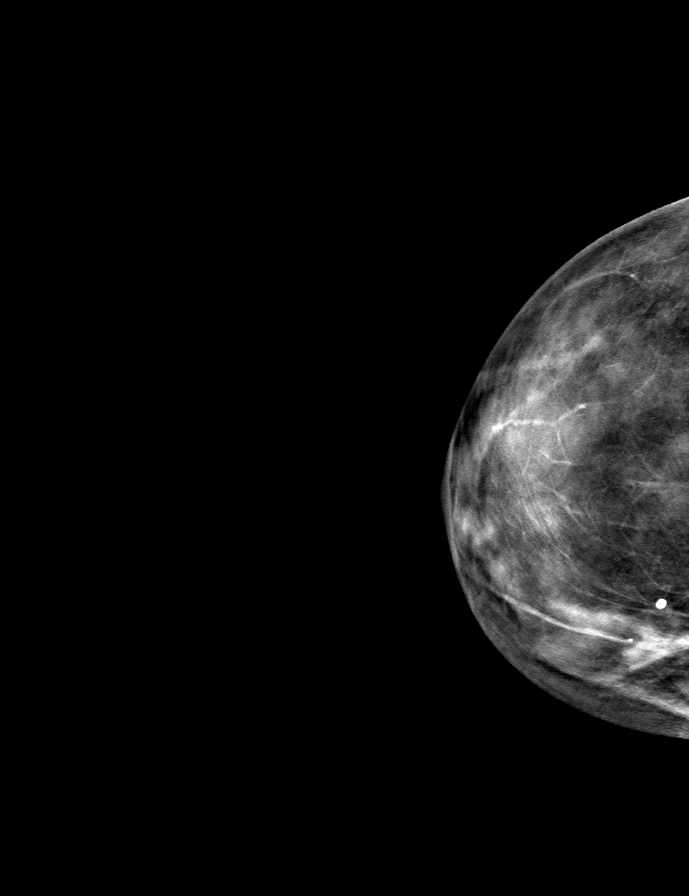

[9 of 24 positions shown; findings below may reference images not displayed]

ACR Breast Density Category c: The breast tissue is heterogeneously
dense, which may obscure small masses.
FINDINGS: There are no findings suspicious for malignancy. Images were
processed with CAD.
IMPRESSION: No mammographic evidence of malignancy. A result letter of this
screening mammogram will be mailed directly to the patient.

RECOMMENDATION:
Screening mammogram in one year. (Code:[5V])

BI-RADS CATEGORY  1: Negative.

## 2019-08-29 ENCOUNTER — Telehealth: Payer: Self-pay | Admitting: Cardiovascular Disease

## 2019-08-29 NOTE — Telephone Encounter (Signed)
Patient seeing Dr Nehemiah Massed .  Removing recall.

## 2020-11-21 ENCOUNTER — Ambulatory Visit
Admission: RE | Admit: 2020-11-21 | Discharge: 2020-11-21 | Disposition: A | Discharge status: Home / Self Care | Payer: Medicare Other | Source: Ambulatory Visit | Attending: Physician Assistant | Admitting: Physician Assistant

## 2020-11-21 ENCOUNTER — Other Ambulatory Visit: Payer: Self-pay | Admitting: Physician Assistant

## 2020-11-21 ENCOUNTER — Other Ambulatory Visit: Payer: Self-pay

## 2020-11-21 DIAGNOSIS — R1032 Left lower quadrant pain: Secondary | ICD-10-CM | POA: Insufficient documentation

## 2020-11-21 IMAGING — CT CT ABD-PELV W/O CM
2 of 4 series · 15 of 46 positions shown, 17 images · non-contrast
Comparison: None.

CLINICAL DATA: Left flank pain with abdominal distension and
diarrhea for 5 months. History of remote breast cancer.

EXAM:
CT ABDOMEN AND PELVIS WITHOUT CONTRAST
TECHNIQUE: Multidetector CT imaging of the abdomen and pelvis was performed
following the standard protocol without IV contrast.

[Series 2: axials routine abdomen pelvis without 5.00 · axial · non-contrast · 0.64mm/px · z∈[-1583,-1223]mm · 12 of 80 slices shown, 14 images]
[im 4/80  soft-tissue]
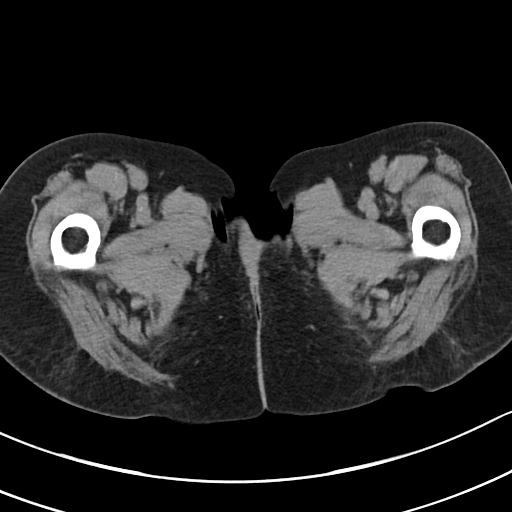
[im 4/80  bone]
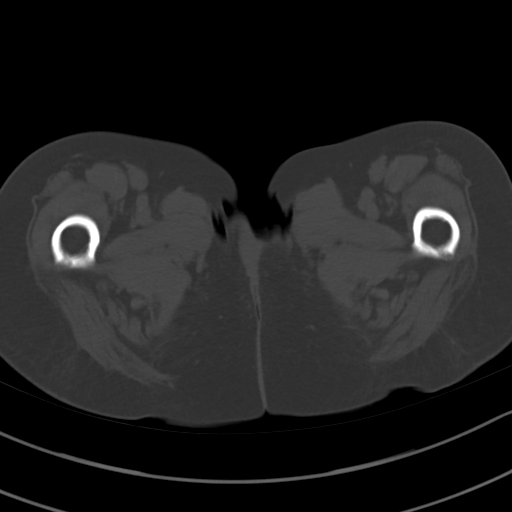
[im 10/80  soft-tissue]
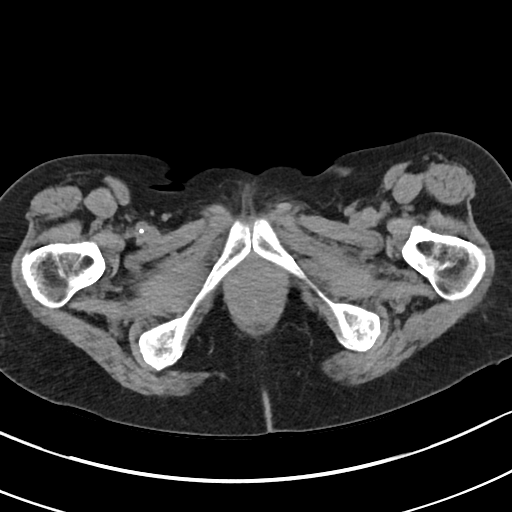
[im 17/80  soft-tissue]
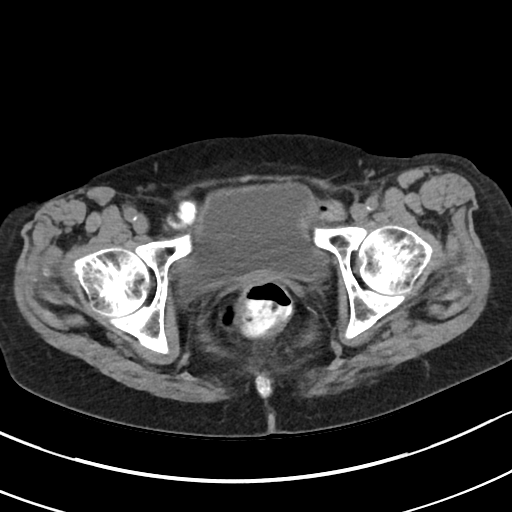
[im 24/80  soft-tissue]
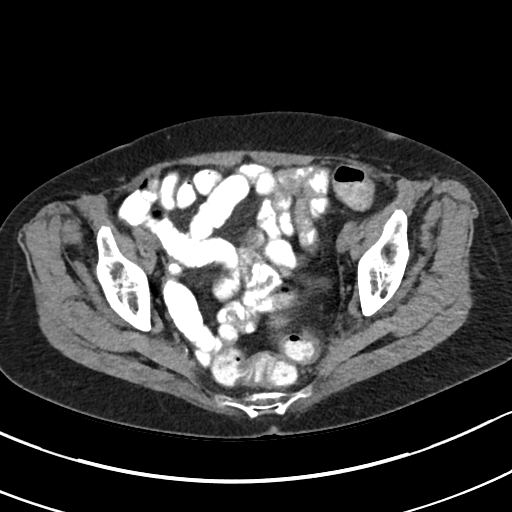
[im 30/80  soft-tissue]
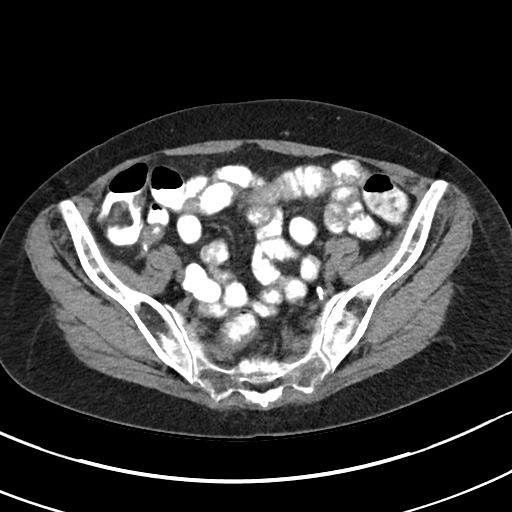
[im 37/80  soft-tissue]
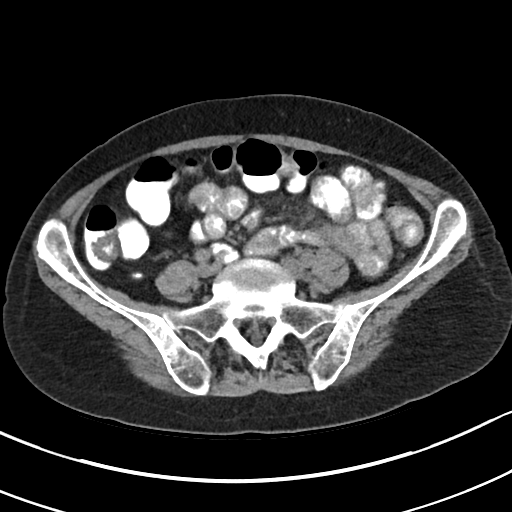
[im 43/80  soft-tissue]
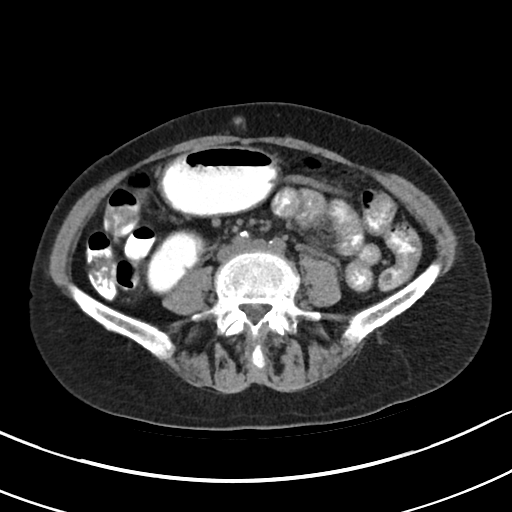
[im 50/80  soft-tissue]
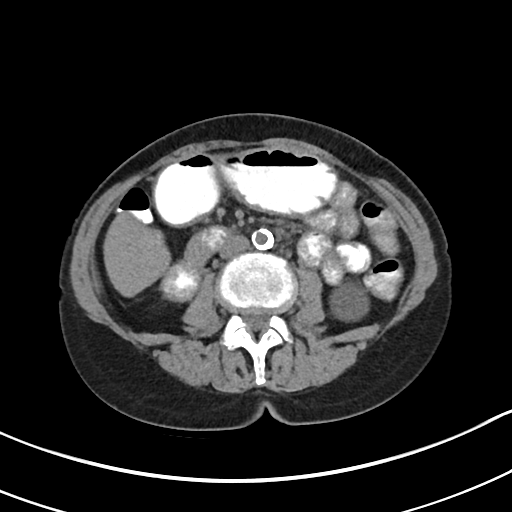
[im 56/80  soft-tissue]
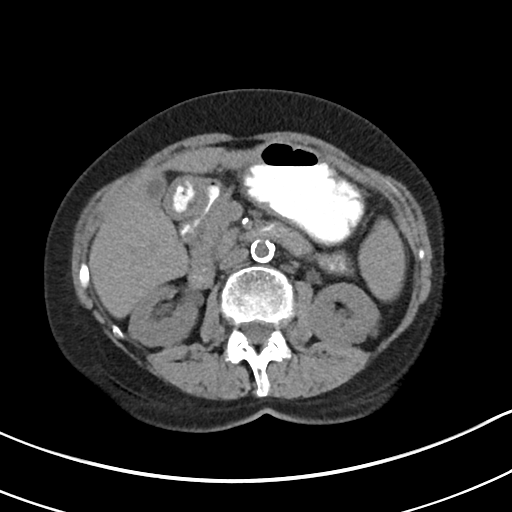
[im 56/80  bone]
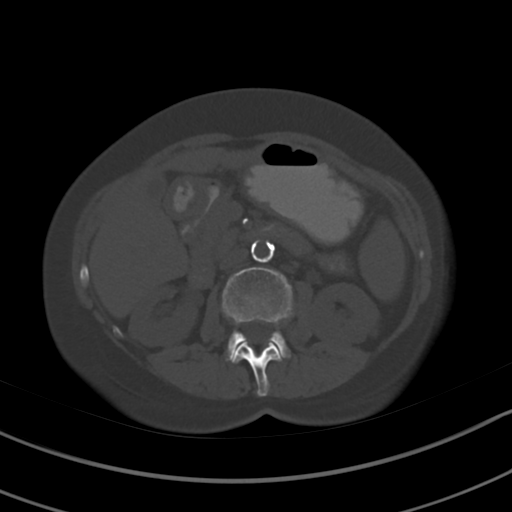
[im 63/80  soft-tissue]
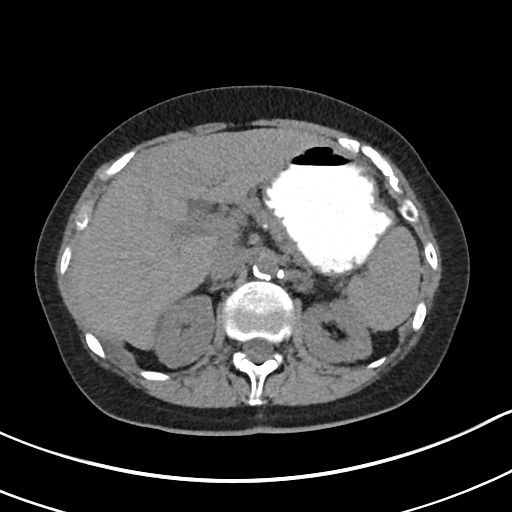
[im 70/80  soft-tissue]
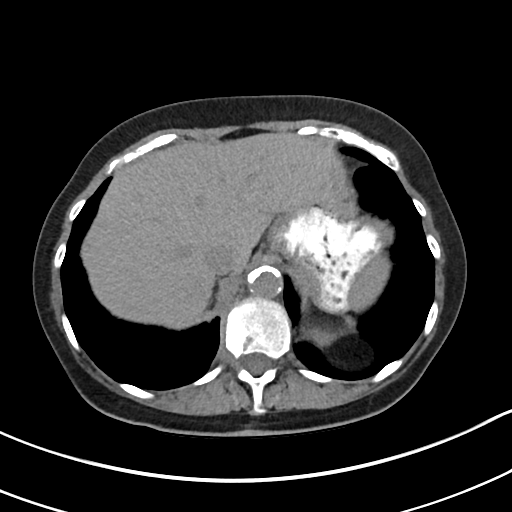
[im 76/80  soft-tissue]
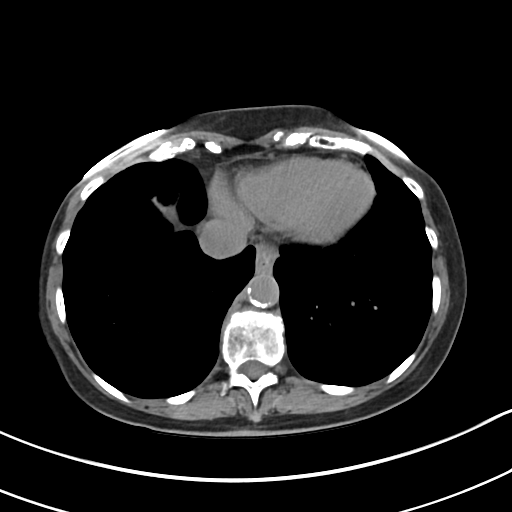

[Series 4: coronals routine abdomen pelvis without 2.00 cor · coronal · non-contrast · 0.64mm/px · 3 of 115 slices shown]
[im 39/115  soft-tissue]
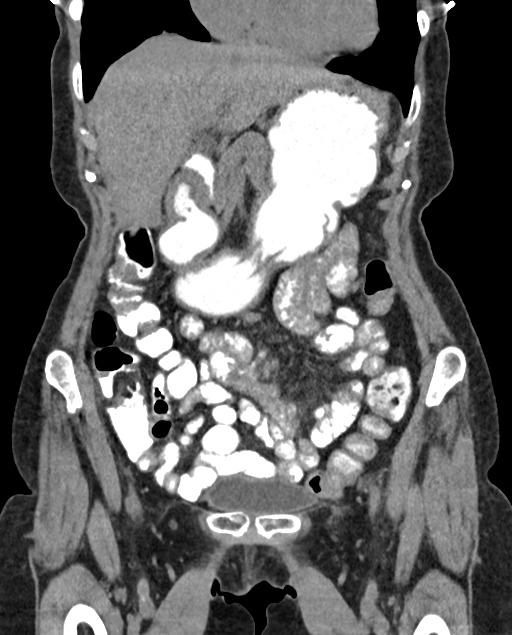
[im 51/115  soft-tissue]
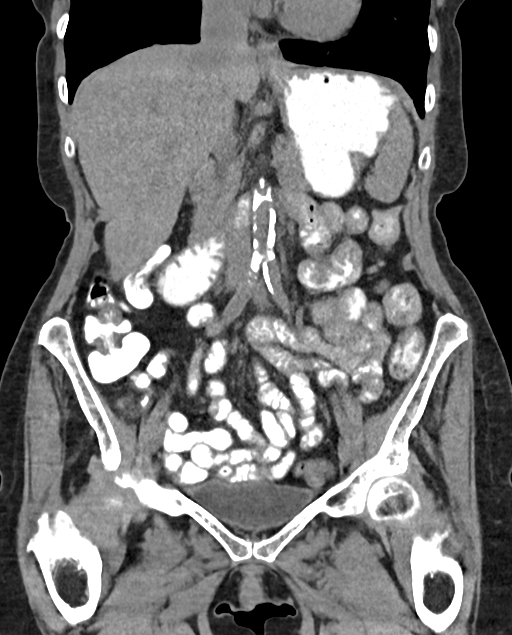
[im 64/115  soft-tissue]
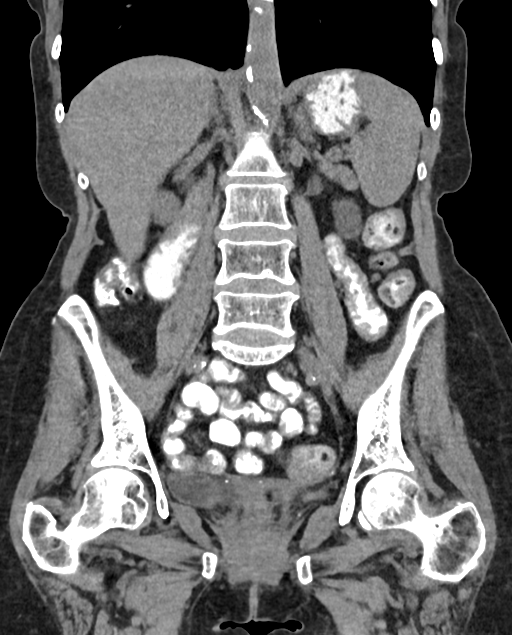

[15 of 46 positions shown; findings below may reference images not displayed]

FINDINGS: Lower chest: Moderate emphysematous changes at both lung bases. No
significant pleural or pericardial effusion. Aortic atherosclerosis
noted.

Hepatobiliary: No focal hepatic abnormalities are identified on
noncontrast imaging. The gallbladder is incompletely distended. No
evidence of gallstones, gallbladder wall thickening or biliary
dilatation.

Pancreas: Unremarkable. No pancreatic ductal dilatation or
surrounding inflammatory changes.

Spleen: Normal in size without focal abnormality.

Adrenals/Urinary Tract: Both adrenal glands appear normal. No
evidence of urinary tract calculus, hydronephrosis or perinephric
soft tissue stranding. There are low-density left renal lesions
consistent with cysts, largest in the lower pole measuring 3.2 cm on
image [DATE]. The bladder appears unremarkable.

Stomach/Bowel: Enteric contrast was administered and has passed to
the rectum. The stomach appears unremarkable for its degree of
distension. No evidence of bowel wall thickening, distention or
surrounding inflammatory change. The appendix appears normal.

Vascular/Lymphatic: There are no enlarged abdominal or pelvic lymph
nodes. Diffuse aortic and branch vessel atherosclerosis.

Reproductive: Hysterectomy.  No evidence of adnexal mass.

Other: No evidence of abdominal wall mass or hernia. No ascites.

Musculoskeletal: No evidence of acute fracture or focal lytic
lesion. There is an ill-defined sclerotic lesion in the right aspect
of the T12 vertebral body which is not clearly discogenic. No other
focal osseous lesions.
IMPRESSION: 1. No acute findings or explanation for the patient's symptoms. No
evidence of urinary tract calculus, hydronephrosis or bowel wall
thickening.
2. Indeterminate sclerotic lesion in the right aspect of the T12
vertebral body. Given the patient's history, this could reflect
metastatic disease (potentially treated). No other signs of
metastatic breast cancer. Consider further assessment with
whole-body bone scan or thoracic MRI.
3. Left renal cysts.
4. Aortic Atherosclerosis ([XH]-[XH]) and Emphysema ([XH]-[XH]).

## 2020-11-25 ENCOUNTER — Other Ambulatory Visit: Payer: Self-pay | Admitting: Internal Medicine

## 2020-11-25 DIAGNOSIS — M5489 Other dorsalgia: Secondary | ICD-10-CM

## 2020-12-03 ENCOUNTER — Ambulatory Visit
Admission: RE | Admit: 2020-12-03 | Discharge: 2020-12-03 | Disposition: A | Discharge status: Home / Self Care | Payer: Medicare Other | Source: Ambulatory Visit | Attending: Internal Medicine | Admitting: Internal Medicine

## 2020-12-03 ENCOUNTER — Other Ambulatory Visit: Payer: Self-pay

## 2020-12-03 DIAGNOSIS — M5489 Other dorsalgia: Secondary | ICD-10-CM | POA: Diagnosis not present

## 2020-12-03 IMAGING — MR MR THORACIC SPINE W/O CM
6 series · 29 of 48 positions shown · non-contrast
Comparison: CT abdomen pelvis [DATE]

CLINICAL DATA: Mid to low back pain. T12 lesion on CT. History of
breast cancer.

EXAM:
MRI THORACIC SPINE WITHOUT CONTRAST
TECHNIQUE: Multiplanar, multisequence MR imaging of the thoracic spine was
performed. No intravenous contrast was administered.

[Series 16: T1 · sagittal · 5.0mm · 1.41mm/px · 2 of 9 slices shown (1 of 2)]
[im 1/9]
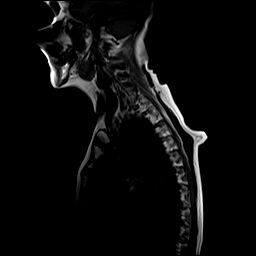
[im 9/9]
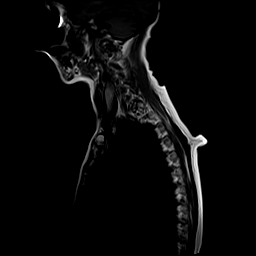

[Series 17: T2 · sagittal · 3.0mm · 1.06mm/px · 6 of 17 slices shown (1 of 2)]
[im 1/17]
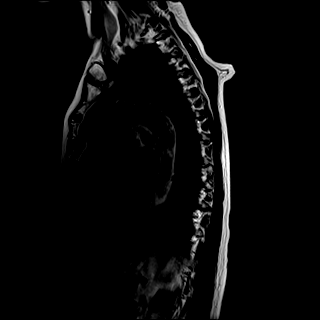
[im 4/17]
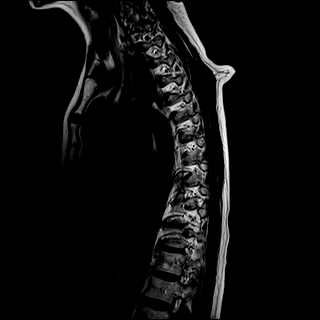
[im 7/17]
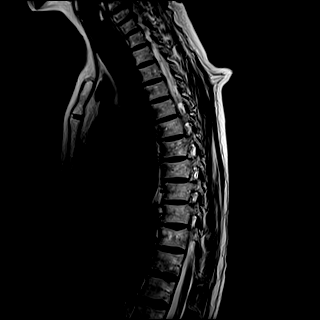
[im 10/17]
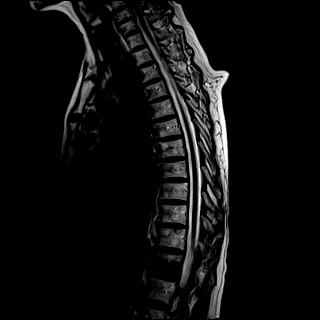
[im 13/17]
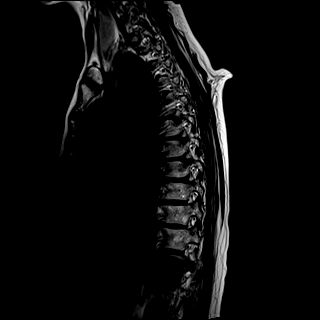
[im 17/17]
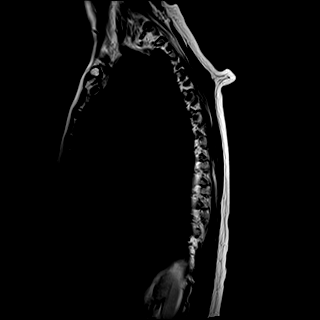

[Series 18: T1 · sagittal · 3.0mm · 1.06mm/px · 6 of 17 slices shown (2 of 2)]
[im 1/17]
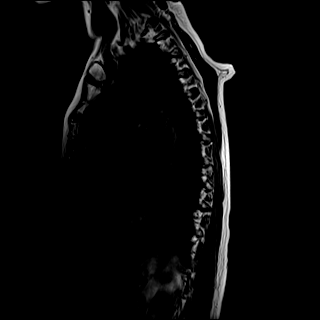
[im 4/17]
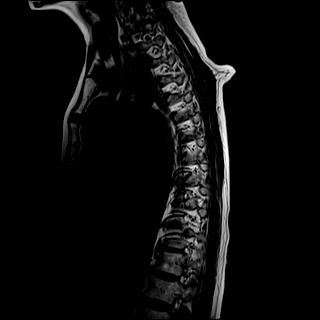
[im 7/17]
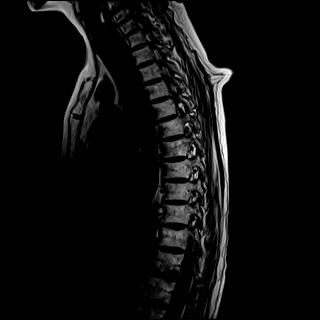
[im 10/17]
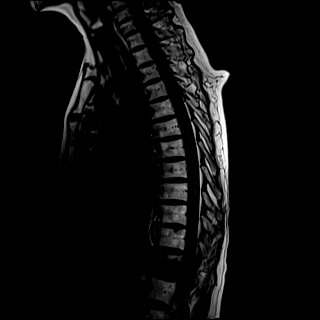
[im 13/17]
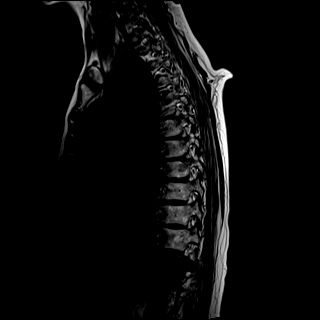
[im 17/17]
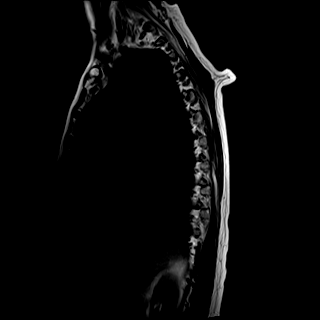

[Series 19: STIR · sagittal · 3.0mm · 0.53mm/px · 6 of 17 slices shown]
[im 1/17]
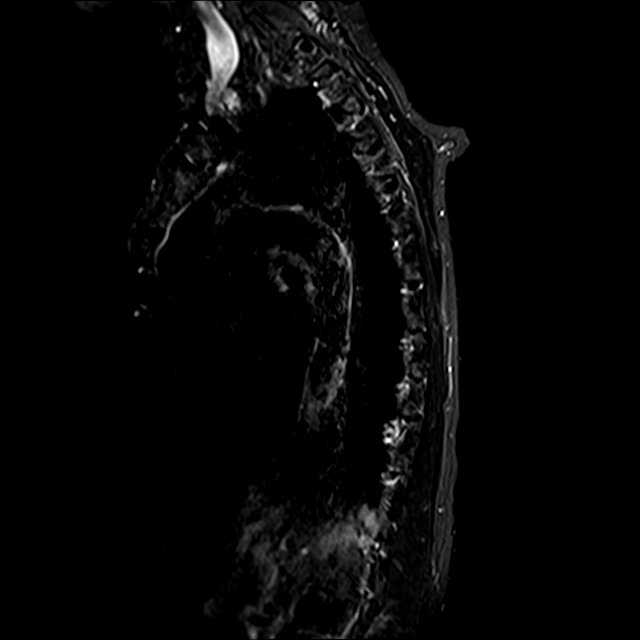
[im 4/17]
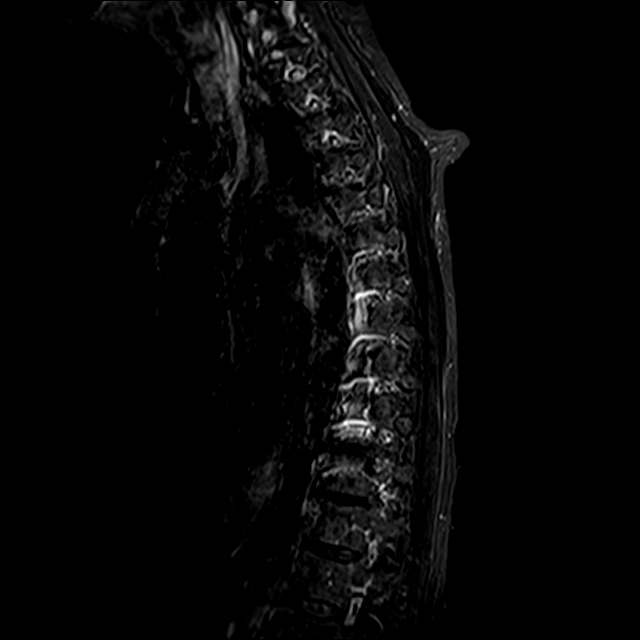
[im 7/17]
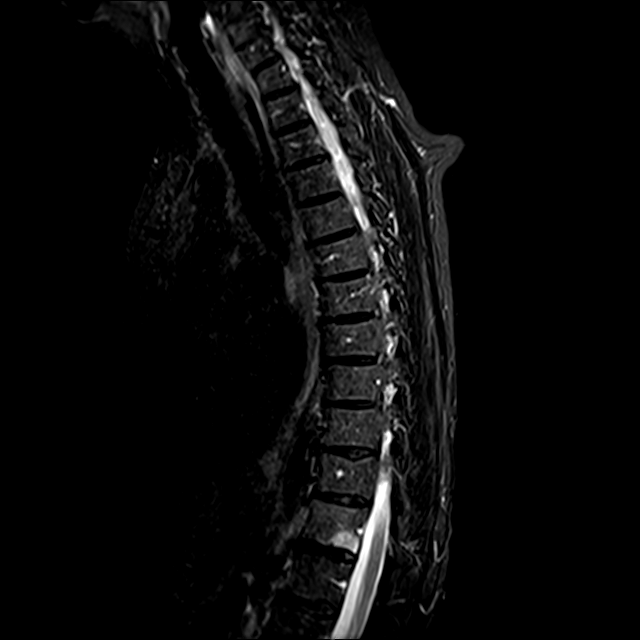
[im 10/17]
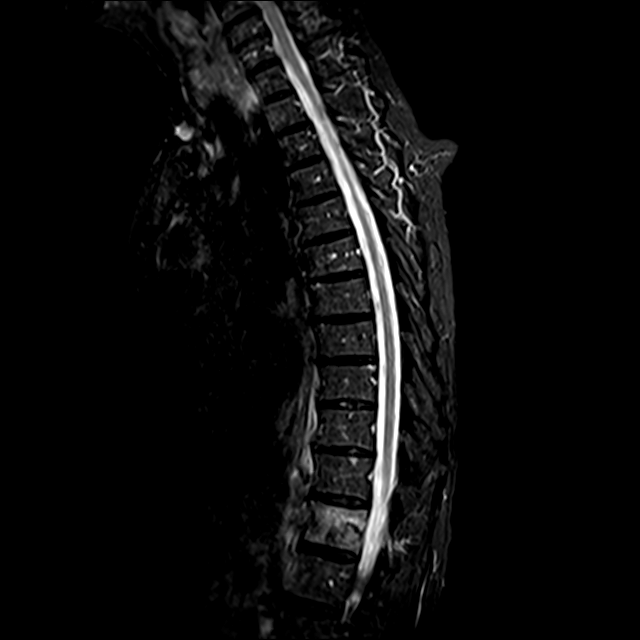
[im 13/17]
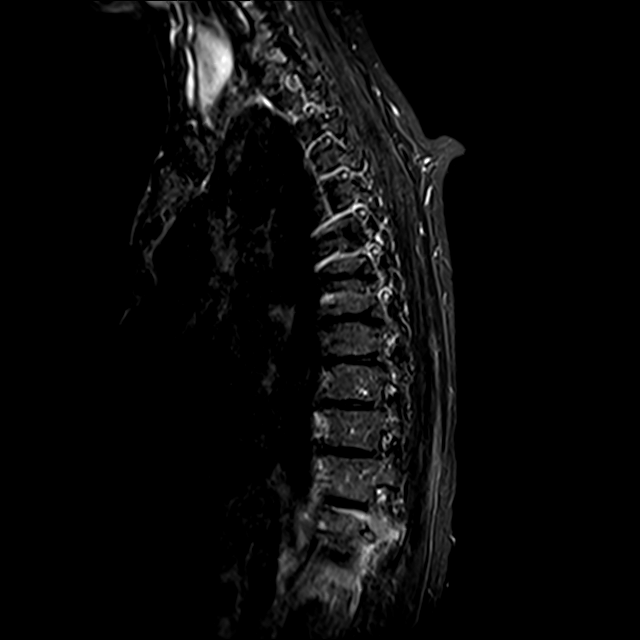
[im 17/17]
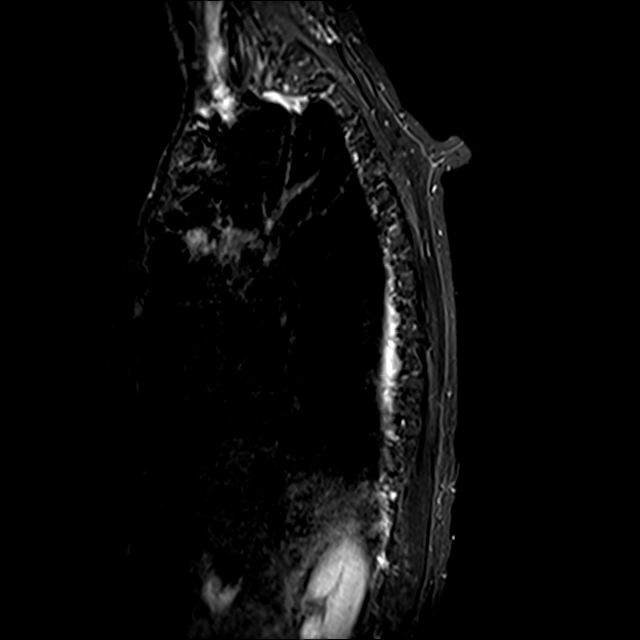

[Series 20: T2 · axial · 4.0mm · 0.59mm/px · z∈[-146,+40]mm · 8 of 39 slices shown (2 of 2)]
[im 1/39]
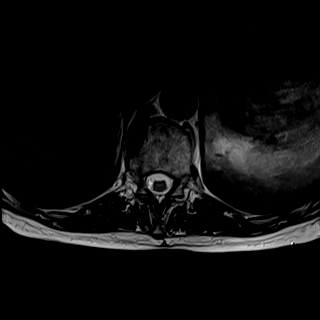
[im 6/39]
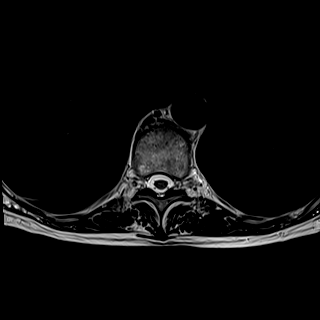
[im 12/39]
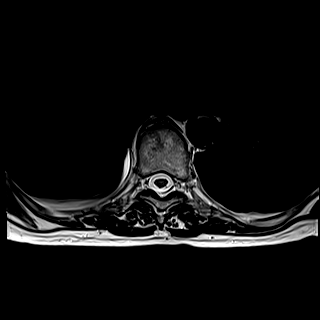
[im 18/39]
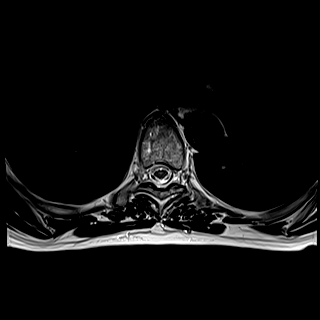
[im 21/39]
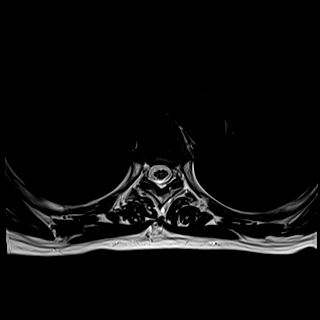
[im 27/39]
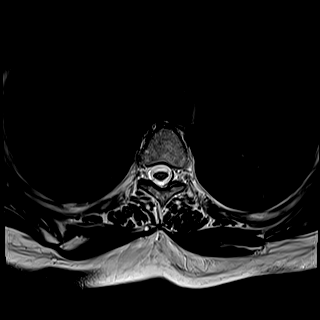
[im 33/39]
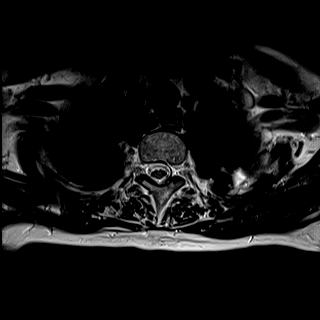
[im 39/39]
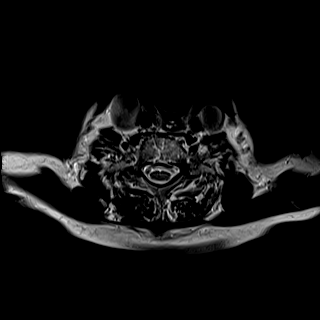

[Series 21: GRE · axial · 4.0mm · 0.37mm/px · 1 of 39 slices shown]
[im 1/39]
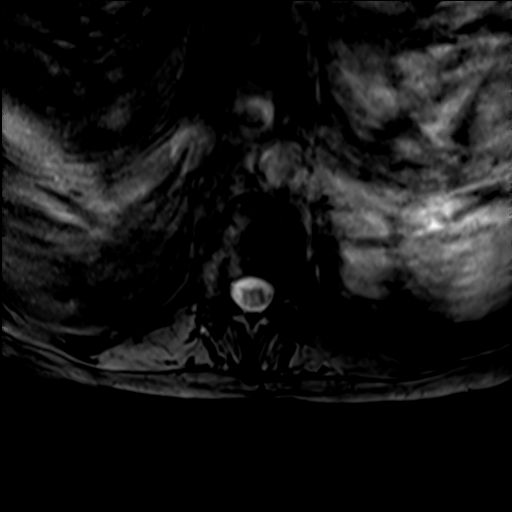

[29 of 48 positions shown; findings below may reference images not displayed]

FINDINGS: Alignment:  Normal

Vertebrae: Lesion in the T12 vertebral body on the right side. This
corresponds to the recent CT abnormality. 20 mm lesion in the
inferior T12 vertebral body on the right with associated defect in
the inferior endplate of T12. Adjacent sclerotic changes laterally.
There is surrounding edema around the lesion.

No other bone lesion identified.  No fracture

Cord:  No cord compression or cord lesion identified.

Paraspinal and other soft tissues: No paraspinous mass identified.
Minimal right pleural effusion

Disc levels:

Mild thoracic disc degeneration without spinal stenosis. Small
central disc protrusion at T2-3 and T3-4
IMPRESSION: 20 mm lesion T12 vertebral body on the right with surrounding
sclerosis and edema. There is an inferior endplate defect associated
with the lesion raising the possibility of Schmorl's node with
surrounding edema and sclerosis. Differential also includes
metastatic disease, possibly treated. No other lesions.

Close imaging follow-up recommended for stability. Correlate with
laboratory values. Postcontrast imaging may be helpful for baseline
evaluation.

## 2020-12-29 ENCOUNTER — Other Ambulatory Visit: Payer: Self-pay | Admitting: Family Medicine

## 2020-12-29 DIAGNOSIS — R937 Abnormal findings on diagnostic imaging of other parts of musculoskeletal system: Secondary | ICD-10-CM

## 2020-12-31 ENCOUNTER — Ambulatory Visit
Admission: RE | Admit: 2020-12-31 | Discharge: 2020-12-31 | Disposition: A | Discharge status: Home / Self Care | Payer: Medicare Other | Source: Ambulatory Visit | Attending: Family Medicine | Admitting: Family Medicine

## 2020-12-31 ENCOUNTER — Other Ambulatory Visit: Payer: Self-pay

## 2020-12-31 DIAGNOSIS — R937 Abnormal findings on diagnostic imaging of other parts of musculoskeletal system: Secondary | ICD-10-CM | POA: Diagnosis not present

## 2020-12-31 IMAGING — MR MR THORACIC SPINE WO/W CM
5 of 9 series · 25 of 48 positions shown · IV contrast (gadavist)
Comparison: Recent MRI from [DATE].

CLINICAL DATA: Follow-up examination of T12 lesion. History of
breast cancer.

EXAM:
MRI THORACIC WITHOUT AND WITH CONTRAST
TECHNIQUE: Multiplanar and multiecho pulse sequences of the thoracic spine were
obtained without and with intravenous contrast.
CONTRAST:  4mL GADAVIST GADOBUTROL 1 MMOL/ML IV SOLN

[Series 16: T1 · sagittal · 5.0mm · 1.41mm/px · 3 of 10 slices shown (1 of 3)]
[im 1/10]
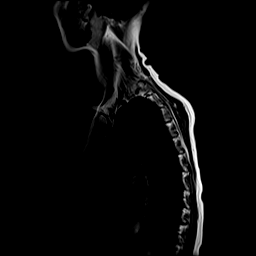
[im 5/10]
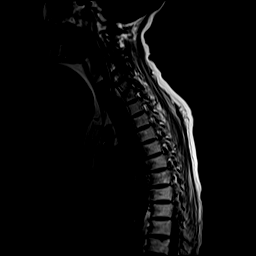
[im 10/10]
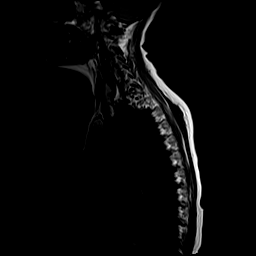

[Series 17: T2 · sagittal · 3.0mm · 1.06mm/px · 4 of 17 slices shown (1 of 2)]
[im 1/17]
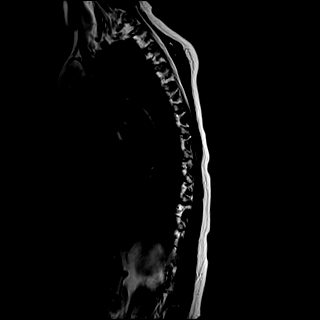
[im 6/17]
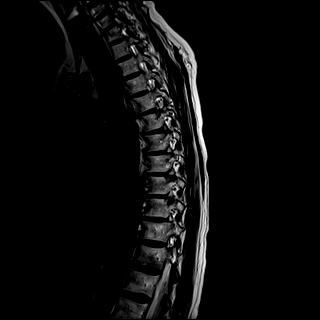
[im 11/17]
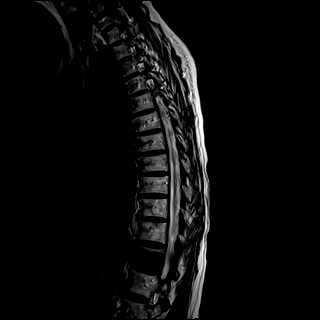
[im 17/17]
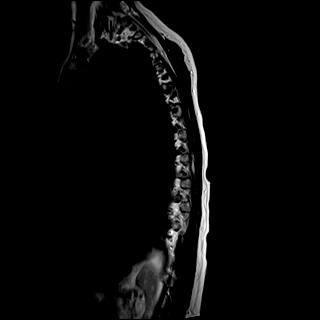

[Series 18: T1 · sagittal · 3.0mm · 1.06mm/px · 3 of 17 slices shown (2 of 3)]
[im 1/17]
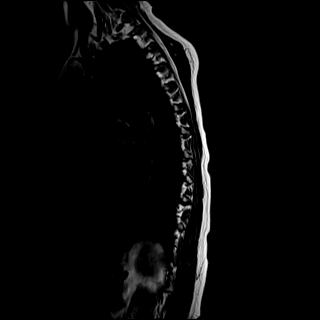
[im 9/17]
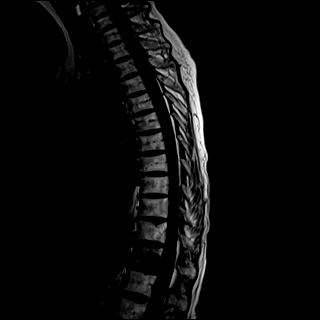
[im 17/17]
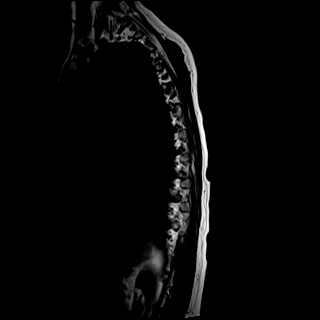

[Series 20: T2 · axial · 4.0mm · 0.59mm/px · z∈[-254,-40]mm · 8 of 41 slices shown (2 of 2)]
[im 1/41]
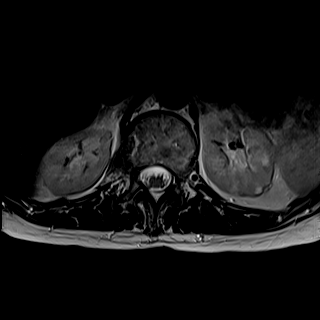
[im 6/41]
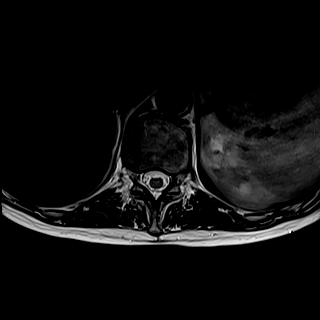
[im 12/41]
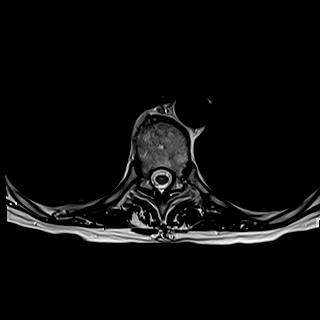
[im 18/41]
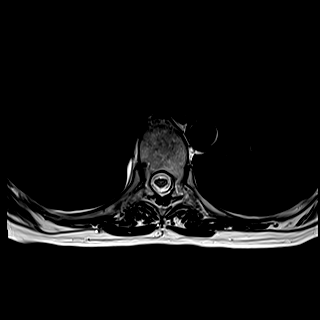
[im 23/41]
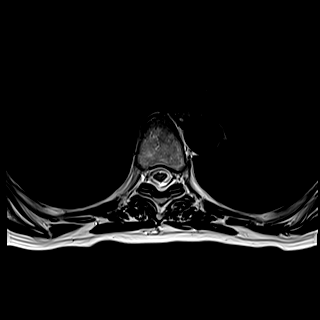
[im 29/41]
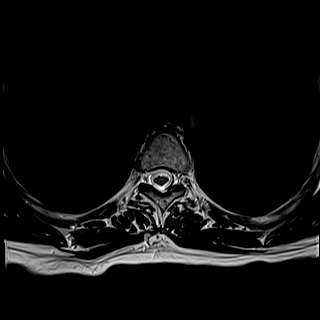
[im 35/41]
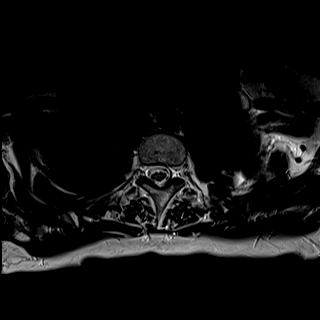
[im 41/41]
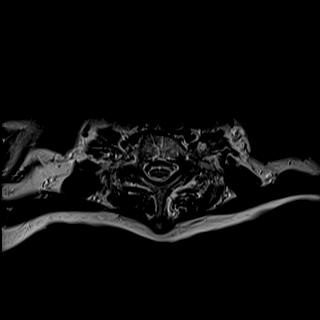

[Series 22: T1 · axial · non-contrast · 4.0mm · 0.31mm/px · z∈[-254,-68]mm · 7 of 41 slices shown (3 of 3)]
[im 1/41]
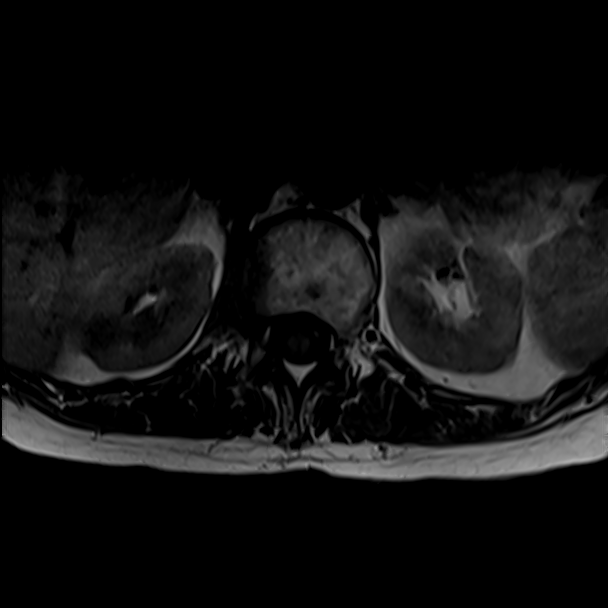
[im 6/41]
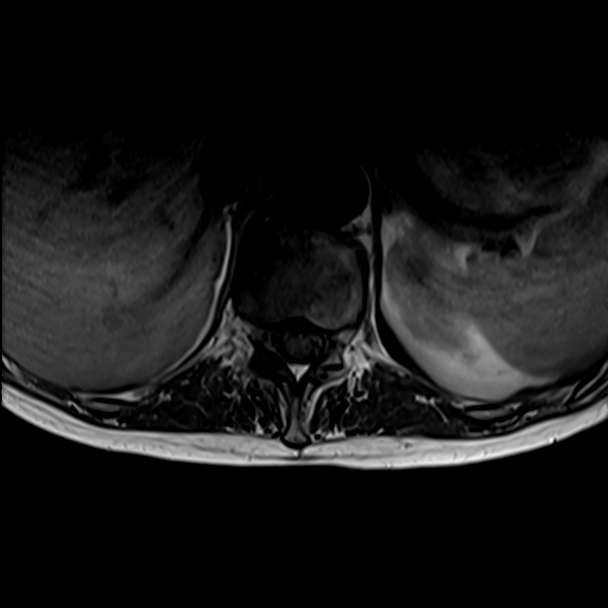
[im 12/41]
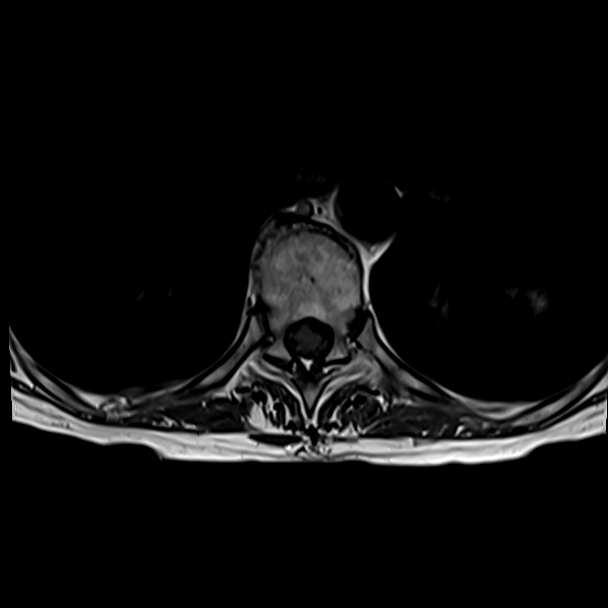
[im 18/41]
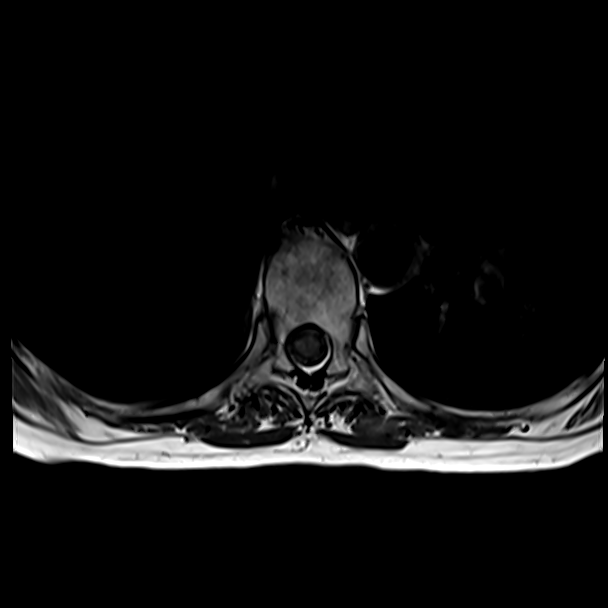
[im 23/41]
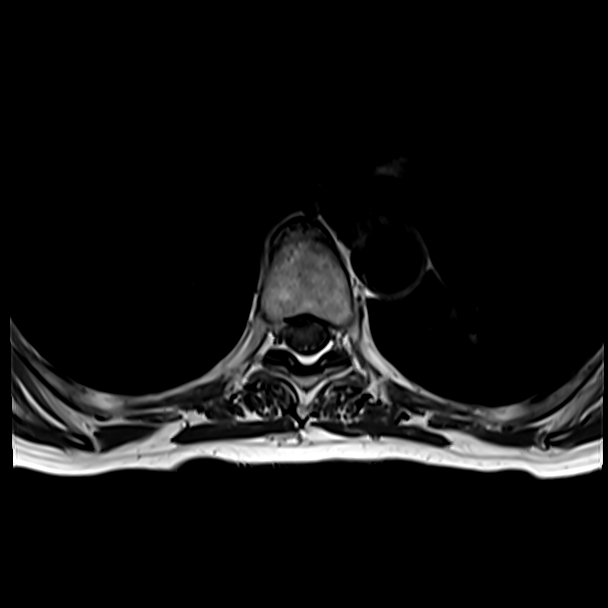
[im 29/41]
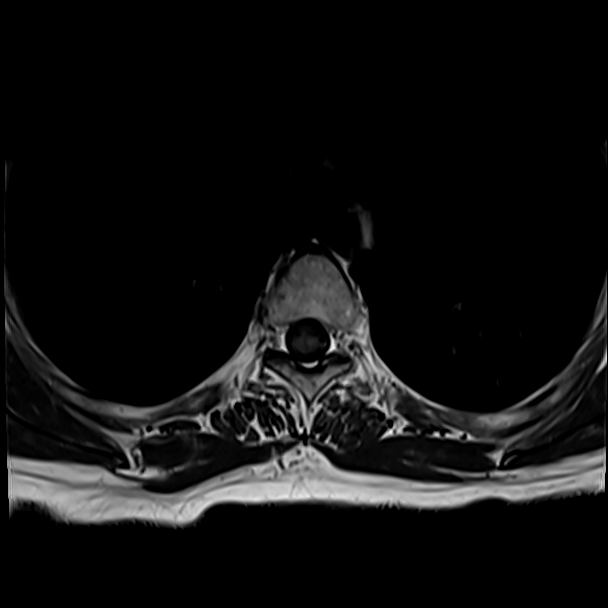
[im 35/41]
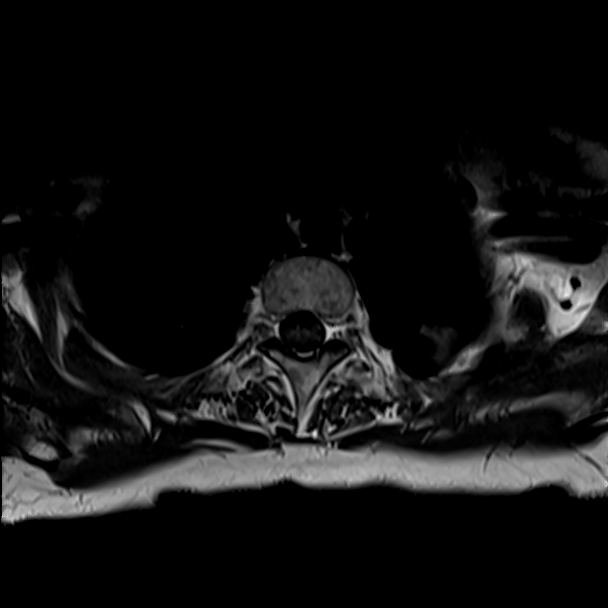

[25 of 48 positions shown; findings below may reference images not displayed]

FINDINGS: Alignment: Physiologic with preservation of the normal thoracic
kyphosis. No listhesis.

Vertebrae: Previously identified involving the right aspect of the
T12 vertebral body again seen. Lesion demonstrates hypointense T1
signal intensity, with associated heterogeneous STIR hyperintense
signal with associated enhancement. Better seen on today's exam,
there is extension of this lesion into the adjacent right posterior
elements including the right pedicle and right T12-L1 facet.
Associated extraosseous extension into the adjacent right
paraspinous soft tissues, with probable involvement of the adjacent
right posterior twelfth rib. The adjacent right diaphragmatic crus
is asymmetrically thickened and may be involved as well (series 22,
image 38). Overall, appearance is highly concerning for an osseous
metastatic implant. There is evidence for extension into the
adjacent right T12-L1 neural foramen which is moderately narrowed
(series 18, image 5). Early osseous expansion into the right T11-12
neural foramen as well. Additionally, there is involvement of the
right aspect of the L1 vertebral body inferiorly (series 18, image
5). Although exact measurements of this lesion are difficult given
its somewhat infiltrative nature, area of involvement measures
approximately 7.1 x 3.3 x 5.8 cm. No other significant epidural
extension for significant spinal stenosis at this time. Schmorl's
node defect again noted without frank pathologic fracture.

No other concerning osseous lesions seen elsewhere within the
thoracic spine. Underlying bone marrow signal intensity is
heterogeneous. No evidence for osteomyelitis discitis or septic
arthritis.

Cord: Normal signal and morphology. No other abnormal intradural
enhancement.

Paraspinal and other soft tissues: Remainder of the visualized
paraspinous soft tissues are within normal limits. Pleuroparenchymal
thickening/scarring noted at the posterior left upper lobe. Trace
layering right pleural effusion noted. Few scattered benign
appearing cyst noted within the visualized kidneys.

Disc levels:

Tiny disc protrusions at T2-3, T3-4, and T5-6 noted without
significant stenosis or cord impingement. Otherwise, no other
significant disc pathology seen for age. No other stenosis or
impingement.
IMPRESSION: 1. Infiltrative lesion involving the right aspect of the T12
vertebral body with extraosseous extension into the adjacent right
posterior elements and paraspinous soft tissues as above. Overall,
appearance is highly concerning for a neoplastic process, with an
osseous metastasis the primary consideration. Partial extension into
the adjacent right T12-L1 neural foramen which is moderately
narrowed. No other significant epidural extension or stenosis at
this time.
2. Probable additional involvement of the right aspect of the L1
vertebral body inferiorly. Complete evaluation with dedicated MRI of
the lumbar spine to evaluate the full extent of this process
suggested.
3. No other concerning osseous lesions within the thoracic spine.
4. Tiny disc protrusions at T2-3, T3-4, and T5-6 without significant
stenosis or cord impingement.

These results will be called to the ordering clinician or
representative by the Radiologist Assistant, and communication
documented in the PACS or [REDACTED].

## 2020-12-31 MED ORDER — GADOBUTROL 1 MMOL/ML IV SOLN
4.0000 mL | Freq: Once | INTRAVENOUS | Status: AC | PRN
  Administered 2020-12-31: 4 mL via INTRAVENOUS

## 2021-01-01 ENCOUNTER — Ambulatory Visit: Payer: Medicare Other

## 2021-01-05 ENCOUNTER — Other Ambulatory Visit: Payer: Self-pay | Admitting: Neurosurgery

## 2021-01-05 DIAGNOSIS — D1809 Hemangioma of other sites: Secondary | ICD-10-CM

## 2021-01-09 ENCOUNTER — Ambulatory Visit
Admission: RE | Admit: 2021-01-09 | Discharge: 2021-01-09 | Disposition: A | Discharge status: Home / Self Care | Payer: Medicare Other | Source: Ambulatory Visit | Attending: Neurosurgery | Admitting: Neurosurgery

## 2021-01-09 ENCOUNTER — Other Ambulatory Visit: Payer: Self-pay

## 2021-01-09 DIAGNOSIS — Z79899 Other long term (current) drug therapy: Secondary | ICD-10-CM | POA: Insufficient documentation

## 2021-01-09 DIAGNOSIS — I4891 Unspecified atrial fibrillation: Secondary | ICD-10-CM | POA: Insufficient documentation

## 2021-01-09 DIAGNOSIS — Z7901 Long term (current) use of anticoagulants: Secondary | ICD-10-CM | POA: Diagnosis not present

## 2021-01-09 DIAGNOSIS — I2510 Atherosclerotic heart disease of native coronary artery without angina pectoris: Secondary | ICD-10-CM | POA: Insufficient documentation

## 2021-01-09 DIAGNOSIS — D1809 Hemangioma of other sites: Secondary | ICD-10-CM | POA: Diagnosis present

## 2021-01-09 LAB — CBC
HCT: 35.8 % — ABNORMAL LOW (ref 36.0–46.0)
Hemoglobin: 12 g/dL (ref 12.0–15.0)
MCH: 31 pg (ref 26.0–34.0)
MCHC: 33.5 g/dL (ref 30.0–36.0)
MCV: 92.5 fL (ref 80.0–100.0)
Platelets: 293 10*3/uL (ref 150–400)
RBC: 3.87 MIL/uL (ref 3.87–5.11)
RDW: 14.6 % (ref 11.5–15.5)
WBC: 7.3 10*3/uL (ref 4.0–10.5)
nRBC: 0 % (ref 0.0–0.2)

## 2021-01-09 LAB — PROTIME-INR
INR: 1 (ref 0.8–1.2)
Prothrombin Time: 12.7 seconds (ref 11.4–15.2)

## 2021-01-09 IMAGING — CT CT BIOPSY CORE BONE DEEP
3 series · 8 of 14 positions shown, 9 images · non-contrast
Comparison: none

CLINICAL DATA: Painful lesion of the T12 vertebral body involving
the posterior elements, especially on the right and immediate
adjacent paraspinal soft tissues.

[Series 3: i-spiral 5.0 b30f · axial · 0.55mm/px · z∈[-283,-203]mm · 4 of 39 slices shown, 5 images (1 of 2)]
[im 8/39  soft-tissue]
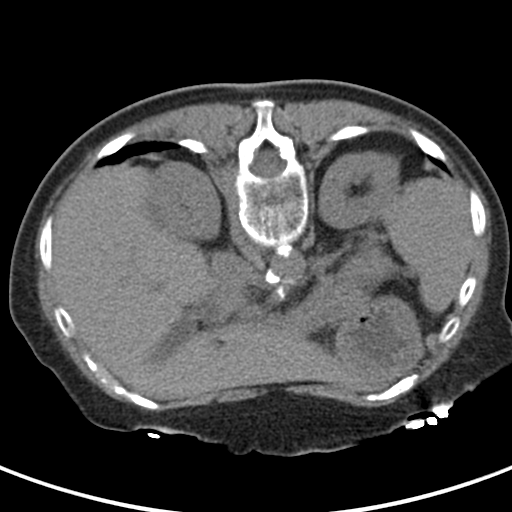
[im 8/39  bone]
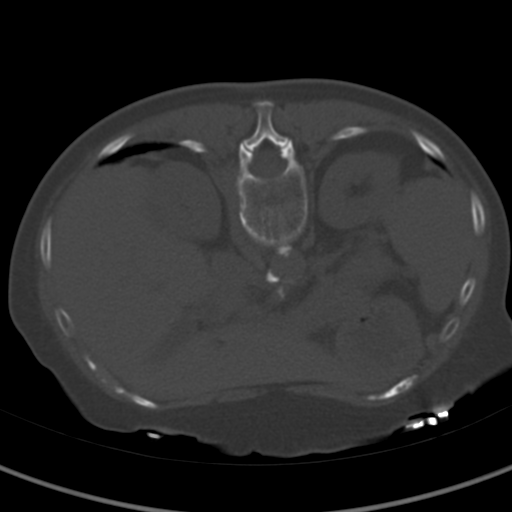
[im 16/39  bone]
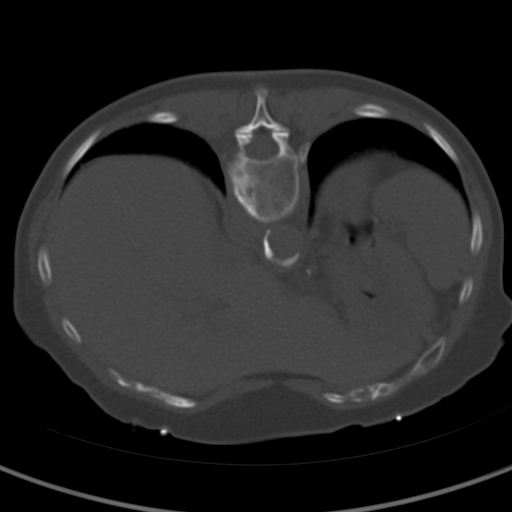
[im 23/39  bone]
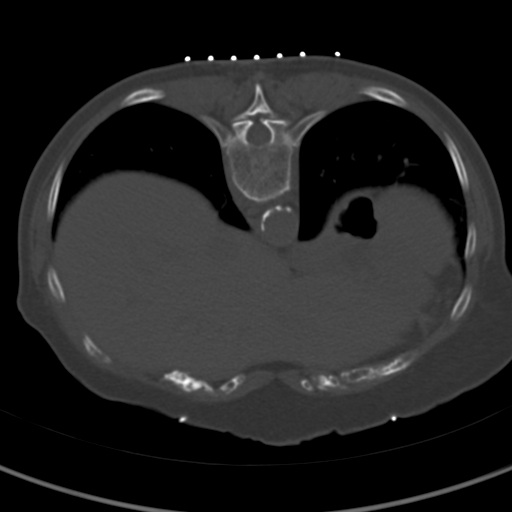
[im 31/39  bone]
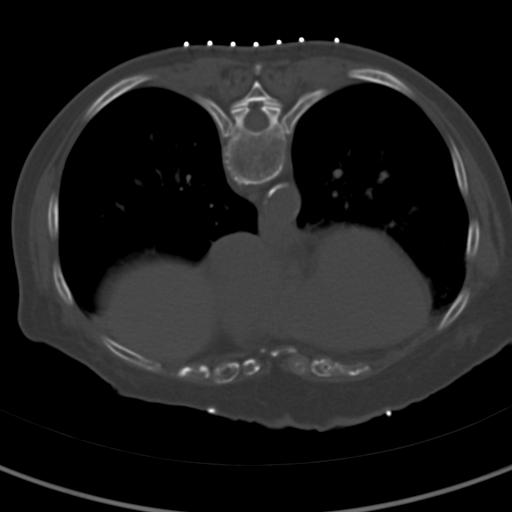

[Series 4: i-sequence 4.8 b30s · axial · 0.55mm/px · z∈[-270,-266]mm · 2 of 24 slices shown]
[im 8/24  bone]
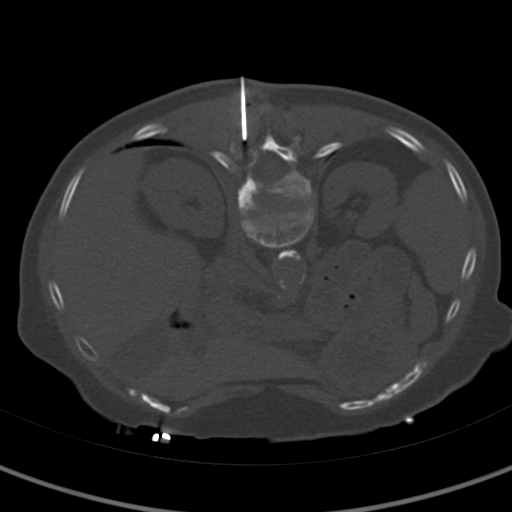
[im 16/24  bone]
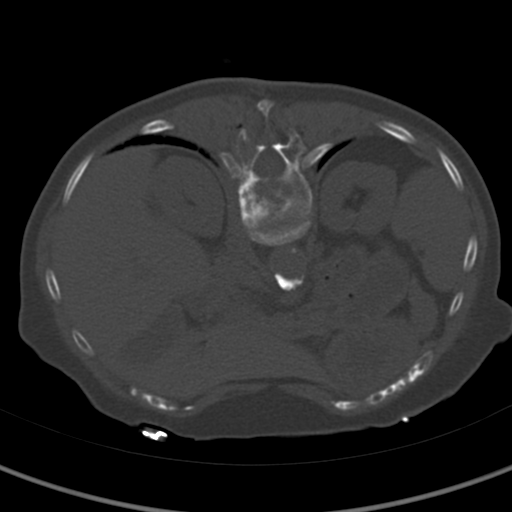

[Series 5: i-spiral 5.0 b30f · axial · 0.55mm/px · z∈[-284,-256]mm · 2 of 25 slices shown (2 of 2)]
[im 9/25  bone]
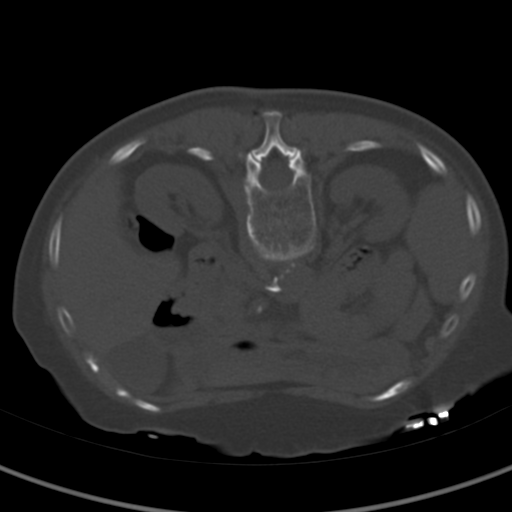
[im 17/25  bone]
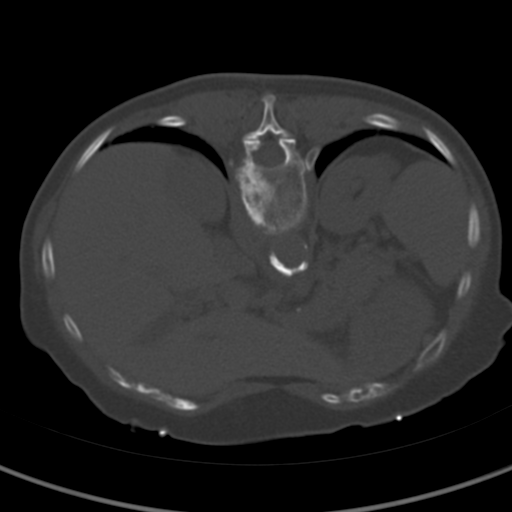

[8 of 14 positions shown; findings below may reference images not displayed]

EXAM:
CT GUIDED CORE BIOPSY OF T12 VERTEBRAL BODY MASS

ANESTHESIA/SEDATION:
0.5 mg IV Versed; 50 mcg IV Fentanyl

Total Moderate Sedation Time:  14 minutes.

The patient's level of consciousness and physiologic status were
continuously monitored during the procedure by Radiology nursing.

PROCEDURE:
The procedure risks, benefits, and alternatives were explained to
the patient. Questions regarding the procedure were encouraged and
answered. The patient understands and consents to the procedure. A
time-out was performed prior to initiating the procedure.

CT was performed through the thoracolumbar region in a prone
position. The paraspinous region was prepped with chlorhexidine in a
sterile fashion, and a sterile drape was applied covering the
operative field. A sterile gown and sterile gloves were used for the
procedure. Local anesthesia was provided with 1% Lidocaine.

Under CT guidance, a 17 gauge trocar needle was advanced via a right
paraspinous approach to the level of the posterior elements of T12.
Coaxial 18 gauge core biopsy samples were obtained with for throws
of the biopsy device. Tissue was sent in formalin. Additional CT was
performed.

COMPLICATIONS:
None
FINDINGS: Initial CT demonstrates destructive lesion involving predominantly
the right-sided lamina and transverse process of the T12 vertebral
body. There also is sclerosis in the immediate adjacent right T12
vertebral body. Solid tissue and tissue fragments were obtained with
biopsy centered at the level of the right T12 transverse process and
lamina.
IMPRESSION: CT-guided biopsy performed of the T12 vertebral body at the level of
the right transverse process and lamina.

## 2021-01-09 MED ORDER — FENTANYL CITRATE (PF) 100 MCG/2ML IJ SOLN
INTRAMUSCULAR | Status: AC
  Administered 2021-01-09: 25 ug
  Administered 2021-01-09: 25 ug via INTRAVENOUS
  Filled 2021-01-09: qty 2

## 2021-01-09 MED ORDER — MIDAZOLAM HCL 2 MG/2ML IJ SOLN
INTRAMUSCULAR | Status: AC
  Administered 2021-01-09: 0.5 mg
  Filled 2021-01-09: qty 2

## 2021-01-09 NOTE — Progress Notes (Signed)
Patient here today for biopsy t12, procedure checklist done prior with right patient'/procedure/vitals stable,procedure timeout with right patient/right procedure ie T12 bone biopsy,allergies noted. Has been NPO, on 2l/min [place].

## 2021-01-09 NOTE — Progress Notes (Signed)
Sedation start 0930, procedure start 0932

## 2021-01-09 NOTE — H&P (Signed)
Chief Complaint: T12 lesion  Referring Physician(s): Meade Maw   Supervising Physician: Aletta Edouard  Patient Status: [facility] - Out-pt  History of Present Illness: Sarah Li is a 80 y.o. female with medical issues including Afib on anticoagulation, history of breast cancer, and CAD.  She tells  me she developed back pain a few months so she saw and orthopedic specialist who started the workup.  She underwent MRI of Thoracic spine  on 01/01/21 which showed= Infiltrative lesion involving the right aspect of the T12 vertebral body with extraosseous extension into the adjacent right posterior elements and paraspinous soft tissues as above. Overall, appearance is highly concerning for a neoplastic process, with an osseous metastasis the primary consideration. Partial extension into the adjacent right T12-L1 neural foramen which is moderately narrowed.  She is here today for biopsy. She is NPO. No nausea/vomiting. No Fever/chills. ROS negative.  She has held her Eliquis.   Past Medical History:  Diagnosis Date   Atrial fibrillation ([place])    Breast cancer ([place]) 2002   positive   Carotid bruit    Coronary artery disease    Dental bridge present    lower left - permanent   Depression    GERD (gastroesophageal reflux disease)    Gout    Hyperlipidemia    Mitral valve regurgitation    Personal history of radiation therapy    Raynaud's disease    Wolff-Parkinson-White syndrome     Past Surgical History:  Procedure Laterality Date   ABDOMINAL HYSTERECTOMY     BREAST EXCISIONAL BIOPSY Right 1980   neg bx   BREAST EXCISIONAL BIOPSY Right 2002   breast ca radation   BREAST LUMPECTOMY     CARDIOVERSION N/A 05/10/2019   Procedure: CARDIOVERSION;  Surgeon: Corey Skains, MD;  Location: [facility] ORS;  Service: Cardiovascular;  Laterality: N/A;   [place] STUDY N/A 12/02/2015   Procedure: CARDIOVERSION;  Surgeon:  Dionisio David, MD;  Location: [facility] ORS;  Service: Cardiovascular;  Laterality: N/A;   PTOSIS REPAIR Bilateral 11/29/2017   Procedure: BLEPHAROPTOSIS REPAIR RESECT EX;  Surgeon: Karle Starch, MD;  Location: [facility] [place];  Service: Ophthalmology;  Laterality: Bilateral;    Allergies: Crestor [rosuvastatin calcium] and Lipitor [atorvastatin]  Medications: Prior to Admission medications   Medication Sig Start Date End Date Taking? Authorizing Provider  allopurinol (ZYLOPRIM) 300 MG tablet Take 300 mg by mouth 2 (two) times daily.     [provider]  amiodarone (PACERONE) 200 MG tablet Take 1 tablet (200 mg total) by mouth daily. 05/10/19   Corey Skains, MD  apixaban (ELIQUIS) 5 MG TABS tablet Take 1 tablet (5 mg total) by mouth 2 (two) times daily. 02/12/16   Minna Merritts, MD  calcium carbonate (TUMS - DOSED IN MG ELEMENTAL CALCIUM) 500 MG chewable tablet Chew 1 tablet by mouth daily.    [provider]  Carboxymethylcellul-Glycerin (CLEAR EYES FOR DRY EYES) 1-0.25 % SOLN Place 1 drop into both eyes daily as needed (Dry eyes).    [provider]  ezetimibe (ZETIA) 10 MG tablet Take 1 tablet (10 mg total) by mouth daily. 08/16/16 05/09/19  Minna Merritts, MD  fluticasone (FLONASE) 50 MCG/ACT nasal spray Place 2 sprays into both nostrils daily as needed for allergies or rhinitis.    [provider]  furosemide (LASIX) 20 MG tablet Take 20 mg by mouth daily as needed for fluid.  [provider]  gemfibrozil (LOPID) 600 MG tablet Take 600 mg by mouth daily.     [provider]  metoprolol tartrate (LOPRESSOR) 50 MG tablet Take 50 mg by mouth 2 (two) times daily. 04/23/19   [provider]  potassium chloride (K-DUR) 10 MEQ tablet TAKE ONE TABLET BY MOUTH EVERY DAY AS NEEDED Patient taking differently: Take 10 mEq by mouth daily.  10/16/18   Minna Merritts, MD  traZODone (DESYREL) 50 MG tablet Take 50 mg by mouth at  bedtime.    [provider]  venlafaxine (EFFEXOR) 75 MG tablet Take 75 mg by mouth daily.     [provider]  Vitamin D, Ergocalciferol, (DRISDOL) 50000 units CAPS capsule Take 50,000 Units by mouth every 7 (seven) days. Friday 01/13/16   [provider]     Family History  Problem Relation Age of Onset   Heart attack Mother    Heart attack Father    Breast cancer Sister    Heart attack Sister    Breast cancer Sister    Heart attack Sister    Heart attack Brother    Heart attack Brother     Social History   Socioeconomic History   Marital status: Married    Spouse name: Not on file   Number of children: Not on file   Years of education: Not on file   Highest education level: Not on file  Occupational History   Not on file  Tobacco Use   Smoking status: Former    Packs/day: 1.00    Years: 30.00    Pack years: 30.00    Types: Cigarettes   Smokeless tobacco: Former    Quit date: 12/01/1989  Vaping Use   Vaping Use: Never used  Substance and Sexual Activity   Alcohol use: No   Drug use: No   Sexual activity: Not on file  Other Topics Concern   Not on file  Social History Narrative   Lives at home in private residence with husband   Social Determinants of Health   Financial Resource Strain: Not on file  Food Insecurity: Not on file  Transportation Needs: Not on file  Physical Activity: Not on file  Stress: Not on file  Social Connections: Not on file     Review of Systems: A 12 point ROS discussed and pertinent positives are indicated in the HPI above.  All other systems are negative.  Review of Systems  Vital Signs: BP (!) 140/97   Pulse 76   Temp 98.3 F (36.8 C) (Oral)   Resp 17   Ht 5\' 2"  (1.575 m)   Wt 46.3 kg   BMI 18.66 kg/m   Physical Exam Vitals reviewed.  Constitutional:      Comments: Elderly, thin, frail.  HENT:     Head: Normocephalic and atraumatic.  Eyes:     Extraocular Movements: Extraocular  movements intact.  Cardiovascular:     Rate and Rhythm: Normal rate and regular rhythm.  Pulmonary:     Effort: Pulmonary effort is normal. No respiratory distress.     Breath sounds: Normal breath sounds.  Abdominal:     Palpations: Abdomen is soft.  Musculoskeletal:        General: Normal range of motion.     Cervical back: Normal range of motion.  Skin:    General: Skin is warm and dry.  Neurological:     General: No focal deficit present.     Mental Status:  She is alert and oriented to person, place, and time.  Psychiatric:        Mood and Affect: Mood normal.        Behavior: Behavior normal.        Thought Content: Thought content normal.        Judgment: Judgment normal.    Imaging: MR THORACIC SPINE W WO CONTRAST  Result Date: 01/01/2021 CLINICAL DATA:  Follow-up examination of T12 lesion. History of breast cancer. EXAM: MRI THORACIC WITHOUT AND WITH CONTRAST TECHNIQUE: Multiplanar and multiecho pulse sequences of the thoracic spine were obtained without and with intravenous contrast. CONTRAST:  37mL GADAVIST GADOBUTROL 1 MMOL/ML IV SOLN COMPARISON:  Recent MRI from 12/03/2020. FINDINGS: Alignment: Physiologic with preservation of the normal thoracic kyphosis. No listhesis. Vertebrae: Previously identified involving the right aspect of the T12 vertebral body again seen. Lesion demonstrates hypointense T1 signal intensity, with associated heterogeneous STIR hyperintense signal with associated enhancement. Better seen on today's exam, there is extension of this lesion into the adjacent right posterior elements including the right pedicle and right T12-L1 facet. Associated extraosseous extension into the adjacent right paraspinous soft tissues, with probable involvement of the adjacent right posterior twelfth rib. The adjacent right diaphragmatic crus is asymmetrically thickened and may be involved as well (series 22, image 38). Overall, appearance is highly concerning for an osseous  metastatic implant. There is evidence for extension into the adjacent right T12-L1 neural foramen which is moderately narrowed (series 18, image 5). Early osseous expansion into the right T11-12 neural foramen as well. Additionally, there is involvement of the right aspect of the L1 vertebral body inferiorly (series 18, image 5). Although exact measurements of this lesion are difficult given its somewhat infiltrative nature, area of involvement measures approximately 7.1 x 3.3 x 5.8 cm. No other significant epidural extension for significant spinal stenosis at this time. Schmorl's node defect again noted without frank pathologic fracture. No other concerning osseous lesions seen elsewhere within the thoracic spine. Underlying bone marrow signal intensity is heterogeneous. No evidence for osteomyelitis discitis or septic arthritis. Cord: Normal signal and morphology. No other abnormal intradural enhancement. Paraspinal and other soft tissues: Remainder of the visualized paraspinous soft tissues are within normal limits. Pleuroparenchymal thickening/scarring noted at the posterior left upper lobe. Trace layering right pleural effusion noted. Few scattered benign appearing cyst noted within the visualized kidneys. Disc levels: Tiny disc protrusions at T2-3, T3-4, and T5-6 noted without significant stenosis or cord impingement. Otherwise, no other significant disc pathology seen for age. No other stenosis or impingement. IMPRESSION: 1. Infiltrative lesion involving the right aspect of the T12 vertebral body with extraosseous extension into the adjacent right posterior elements and paraspinous soft tissues as above. Overall, appearance is highly concerning for a neoplastic process, with an osseous metastasis the primary consideration. Partial extension into the adjacent right T12-L1 neural foramen which is moderately narrowed. No other significant epidural extension or stenosis at this time. 2. Probable additional  involvement of the right aspect of the L1 vertebral body inferiorly. Complete evaluation with dedicated MRI of the lumbar spine to evaluate the full extent of this process suggested. 3. No other concerning osseous lesions within the thoracic spine. 4. Tiny disc protrusions at T2-3, T3-4, and T5-6 without significant stenosis or cord impingement. These results will be called to the ordering clinician or representative by the Radiologist Assistant, and communication documented in the PACS or [employer]. Electronically Signed   By: Jeannine Boga M.D.   On: 01/01/2021  06:42    Labs:  CBC: Recent Labs    01/09/21 0834  WBC 7.3  HGB 12.0  HCT 35.8*  PLT 293    COAGS: No results for input(s): INR, APTT in the last 8760 hours.  BMP: No results for input(s): NA, K, CL, CO2, GLUCOSE, BUN, CALCIUM, CREATININE, GFRNONAA, GFRAA in the last 8760 hours.  Invalid input(s): CMP  LIVER FUNCTION TESTS: No results for input(s): BILITOT, AST, ALT, ALKPHOS, PROT, ALBUMIN in the last 8760 hours.  TUMOR MARKERS: No results for input(s): AFPTM, CEA, CA199, CHROMGRNA in the last 8760 hours.  Assessment and Plan:  T12 lesion concerning for malignancy.  Will proceed with image guided biopsy today by Dr. Kathlene Cote.   Risks and benefits of T12 biopsy was discussed with the patient and/or patient's family including, but not limited to bleeding, infection, damage to adjacent structures or low yield requiring additional tests.  All of the questions were answered and there is agreement to proceed.  Consent signed and in chart.  Thank you for allowing our service to participate in KAYLANY TESORIERO 's care.  Electronically Signed: Murrell Redden, PA-C   01/09/2021, 8:43 AM      I spent a total of  30 Minutes in face to face in clinical consultation, greater than 50% of which was counseling/coordinating care for T12 biopsy.

## 2021-01-09 NOTE — Procedures (Signed)
Interventional Radiology Procedure Note  Procedure: CT Guided Biopsy of R74 lesion  Complications: None  Estimated Blood Loss: < 10 mL  Findings: 18 G core biopsy of T12 vertebral body lesion performed under CT guidance.  Four core samples obtained and sent to Pathology.  Venetia Night. Kathlene Cote, M.D Pager:  [phone]

## 2021-01-09 NOTE — Progress Notes (Signed)
Sedation end 0944, procedure end time @ 0944, tolerated well.

## 2021-01-13 LAB — SURGICAL PATHOLOGY

## 2021-01-20 ENCOUNTER — Inpatient Hospital Stay: Payer: Medicare Other

## 2021-01-20 ENCOUNTER — Encounter: Payer: Self-pay | Admitting: Internal Medicine

## 2021-01-20 ENCOUNTER — Inpatient Hospital Stay: Payer: Medicare Other | Attending: Internal Medicine | Admitting: Internal Medicine

## 2021-01-20 ENCOUNTER — Other Ambulatory Visit: Payer: Self-pay

## 2021-01-20 DIAGNOSIS — C7951 Secondary malignant neoplasm of bone: Secondary | ICD-10-CM | POA: Diagnosis present

## 2021-01-20 DIAGNOSIS — Z87891 Personal history of nicotine dependence: Secondary | ICD-10-CM | POA: Insufficient documentation

## 2021-01-20 DIAGNOSIS — M549 Dorsalgia, unspecified: Secondary | ICD-10-CM | POA: Insufficient documentation

## 2021-01-20 DIAGNOSIS — Z803 Family history of malignant neoplasm of breast: Secondary | ICD-10-CM | POA: Insufficient documentation

## 2021-01-20 DIAGNOSIS — C3411 Malignant neoplasm of upper lobe, right bronchus or lung: Secondary | ICD-10-CM | POA: Diagnosis not present

## 2021-01-20 LAB — COMPREHENSIVE METABOLIC PANEL
ALT: 8 U/L (ref 0–44)
AST: 20 U/L (ref 15–41)
Albumin: 4 g/dL (ref 3.5–5.0)
Alkaline Phosphatase: 92 U/L (ref 38–126)
Anion gap: 11 (ref 5–15)
BUN: 18 mg/dL (ref 8–23)
CO2: 26 mmol/L (ref 22–32)
Calcium: 9.4 mg/dL (ref 8.9–10.3)
Chloride: 100 mmol/L (ref 98–111)
Creatinine, Ser: 0.87 mg/dL (ref 0.44–1.00)
GFR, Estimated: >60 mL/min (ref >60)
Glucose, Bld: 77 mg/dL (ref 70–99)
Potassium: 3.8 mmol/L (ref 3.5–5.1)
Sodium: 137 mmol/L (ref 135–145)
Total Bilirubin: 0.2 mg/dL — ABNORMAL LOW (ref 0.3–1.2)
Total Protein: 7.8 g/dL (ref 6.5–8.1)

## 2021-01-20 LAB — CBC WITH DIFFERENTIAL/PLATELET
Abs Immature Granulocytes: 0.02 10*3/uL (ref 0.00–0.07)
Basophils Absolute: 0 10*3/uL (ref 0.0–0.1)
Basophils Relative: 1 %
Eosinophils Absolute: 0.1 10*3/uL (ref 0.0–0.5)
Eosinophils Relative: 1 %
HCT: 36.7 % (ref 36.0–46.0)
Hemoglobin: 11.9 g/dL — ABNORMAL LOW (ref 12.0–15.0)
Immature Granulocytes: 0 %
Lymphocytes Relative: 22 %
Lymphs Abs: 1.3 10*3/uL (ref 0.7–4.0)
MCH: 29.8 pg (ref 26.0–34.0)
MCHC: 32.4 g/dL (ref 30.0–36.0)
MCV: 91.8 fL (ref 80.0–100.0)
Monocytes Absolute: 0.7 10*3/uL (ref 0.1–1.0)
Monocytes Relative: 12 %
Neutro Abs: 3.9 10*3/uL (ref 1.7–7.7)
Neutrophils Relative %: 64 %
Platelets: 302 10*3/uL (ref 150–400)
RBC: 4 MIL/uL (ref 3.87–5.11)
RDW: 14.2 % (ref 11.5–15.5)
WBC: 6 10*3/uL (ref 4.0–10.5)
nRBC: 0 % (ref 0.0–0.2)

## 2021-01-20 MED ORDER — HYDROCODONE-ACETAMINOPHEN 5-325 MG PO TABS: 1.0000 | ORAL_TABLET | Freq: Two times a day (BID) | ORAL | 0 refills | Status: DC | PRN

## 2021-01-20 NOTE — Assessment & Plan Note (Addendum)
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  The origin primary tumor is unclear.  Do not suspect patient's prior history of breast cancer/melanoma to be the cause of patient's lesions in T12 vertebral-given the difference in histologies.  I recommend a PET scan for further evaluation ASAP.  Recommend labs.  History of smoking-currently quit.  #Patient understands that she will need systemic therapy based upon above work-up/PET scan.  We will also need MRI brain.   #Back pain: Secondary to underlying malignancy/await above work-up.  Recommend evaluation with Dr. Donella Stade for radiation.  Given the metastatic disease/ongoing pain-we will take over patient's pain management [prior with Dr.Chasnis].  Prescription for hydrocodone 1 pill every 12 hours as needed nightly.   # Thank you Dr. Cari Caraway for allowing me to participate in the care of your pleasant patient. Please do not hesitate to contact me with questions or concerns in the interim.  # DISPOSITION: # refer to Dr.Crystal re: bone metastases # PET scan ASAP # labs today- cbc/cmp- # follow up 1-2 days after the PET scan- Dr.B  # I reviewed the blood work- with the patient in detail; also reviewed the imaging independently [as summarized above]; and with the patient in detail.

## 2021-01-20 NOTE — Progress Notes (Signed)
[facility] OFFICE PROGRESS NOTE  Patient Care Team: Idelle Crouch, MD as PCP - General (Internal Medicine) Minna Merritts, MD as Consulting Physician (Cardiology)  Cancer Staging No matching staging information was found for the patient.   Oncology History Overview Note  DIAGNOSIS:  A. BONE, T12 VERTEBRAL BODY LESION; BIOPSY:  - METASTATIC SQUAMOUS CELL CARCINOMA.  - SEE COMMENT   1. No acute findings or explanation for the patient's symptoms. No evidence of urinary tract calculus, hydronephrosis or bowel wall thickening. 2. Indeterminate sclerotic lesion in the right aspect of the T12 vertebral body. Given the patient's history, this could reflect metastatic disease (potentially treated). No other signs of metastatic breast cancer. Consider further assessment with whole-body bone scan or thoracic MRI. 3. Left renal cysts.  # Melanoma of right cheek- s/p [15-20 years ago]   # right lumpectomy- Breast cancer [Dr.Crystal s/p RT; pills x 5 years- 2002]   Cancer, metastatic to bone ([place])  01/20/2021 Initial Diagnosis   Cancer, metastatic to bone ([facility])       HPI: Ambulating independently.  Accompanied by husband.  Sarah Li 80 y.o.  female pleasant patient above history of newly diagnosed metastatic lesion to T12 vertebra is here for a initial consultation.  Patient noted to have pain in the mid back for the last few months.  Progressively getting worse.  She was evaluated by PCP-CT scan abdomen pelvis that showed a T12 sclerotic lesion.  Evaluated further by MRI-concerning for metastatic lesion.  She was evaluated by pain clinic-recommend hydrocodone with improvement of the pain.  She was evaluated by Dr. Cari Caraway neurosurgery-biopsy showed squamous cell carcinoma metastatic to the T12 vertebrae.  Denies any lumps or bumps in the neck.  Denies any difficulty swallowing or pain with swallowing.   Patient had prior history of breast cancer 20 years  ago; and again melanoma 15 years ago of the right cheek.   Review of Systems  Constitutional:  Positive for malaise/fatigue. Negative for chills, diaphoresis, fever and weight loss.  HENT:  Negative for nosebleeds and sore throat.   Eyes:  Negative for double vision.  Respiratory:  Negative for cough, hemoptysis, sputum production, shortness of breath and wheezing.   Cardiovascular:  Negative for chest pain, palpitations, orthopnea and leg swelling.  Gastrointestinal:  Negative for abdominal pain, blood in stool, constipation, diarrhea, heartburn, melena, nausea and vomiting.  Genitourinary:  Negative for dysuria, frequency and urgency.  Musculoskeletal:  Positive for back pain. Negative for joint pain.  Skin: Negative.  Negative for itching and rash.  Neurological:  Negative for dizziness, tingling, focal weakness, weakness and headaches.  Endo/Heme/Allergies:  Does not bruise/bleed easily.  Psychiatric/Behavioral:  Negative for depression. The patient is not nervous/anxious and does not have insomnia.      PAST MEDICAL HISTORY :  Past Medical History:  Diagnosis Date  . Atrial fibrillation ([place])   . Breast cancer ([place]) 2002   positive  . Carotid bruit   . Coronary artery disease   . Dental bridge present    lower left - permanent  . Depression   . GERD (gastroesophageal reflux disease)   . Gout   . Hyperlipidemia   . Mitral valve regurgitation   . Personal history of radiation therapy   . Raynaud's disease   . Wolff-Parkinson-White syndrome     PAST SURGICAL HISTORY :   Past Surgical History:  Procedure Laterality Date  . ABDOMINAL HYSTERECTOMY    . BREAST EXCISIONAL BIOPSY Right 1980  neg bx  . BREAST EXCISIONAL BIOPSY Right 2002   breast ca radation  . BREAST LUMPECTOMY    . CARDIOVERSION N/A 05/10/2019   Procedure: CARDIOVERSION;  Surgeon: Corey Skains, MD;  Location: [facility] ORS;  Service: Cardiovascular;  Laterality: N/A;  . CLOSED REDUCTION CLAVICLE FRACTURE     . ELECTROPHYSIOLOGIC STUDY N/A 12/02/2015   Procedure: CARDIOVERSION;  Surgeon: Dionisio David, MD;  Location: [facility] ORS;  Service: Cardiovascular;  Laterality: N/A;  . PTOSIS REPAIR Bilateral 11/29/2017   Procedure: BLEPHAROPTOSIS REPAIR RESECT EX;  Surgeon: Karle Starch, MD;  Location: [facility];  Service: Ophthalmology;  Laterality: Bilateral;    FAMILY HISTORY :   Family History  Problem Relation Age of Onset  . Heart attack Mother   . Heart attack Father   . Breast cancer Sister   . Heart attack Sister   . Breast cancer Sister   . Heart attack Sister   . Heart attack Brother   . Heart attack Brother     SOCIAL HISTORY:   Social History   Tobacco Use  . Smoking status: Former    Packs/day: 1.00    Years: 30.00    Pack years: 30.00    Types: Cigarettes  . Smokeless tobacco: Former    Quit date: 12/01/1989  Vaping Use  . Vaping Use: Never used  Substance Use Topics  . Alcohol use: No  . Drug use: No    ALLERGIES:  is allergic to crestor [rosuvastatin calcium] and lipitor [atorvastatin].  MEDICATIONS:  Current Outpatient Medications  Medication Sig Dispense Refill  . allopurinol (ZYLOPRIM) 300 MG tablet Take 300 mg by mouth 2 (two) times daily.     Marland Kitchen apixaban (ELIQUIS) 5 MG TABS tablet Take 1 tablet (5 mg total) by mouth 2 (two) times daily. 60 tablet 11  . calcium carbonate (TUMS - DOSED IN MG ELEMENTAL CALCIUM) 500 MG chewable tablet Chew 1 tablet by mouth daily.    . Carboxymethylcellul-Glycerin (CLEAR EYES FOR DRY EYES) 1-0.25 % SOLN Place 1 drop into both eyes daily as needed (Dry eyes).    . ezetimibe (ZETIA) 10 MG tablet Take 1 tablet (10 mg total) by mouth daily. 90 tablet 3  . fluticasone (FLONASE) 50 MCG/ACT nasal spray Place 2 sprays into both nostrils daily as needed for allergies or rhinitis.    . furosemide (LASIX) 20 MG tablet Take 20 mg by mouth daily as needed for fluid.     Marland Kitchen gemfibrozil (LOPID) 600 MG tablet Take 600 mg by mouth daily.      . metoprolol succinate (TOPROL-XL) 50 MG 24 hr tablet Take 50 mg by mouth daily.    . potassium chloride (KLOR-CON) 10 MEQ tablet Take by mouth.    . traZODone (DESYREL) 50 MG tablet Take 50 mg by mouth at bedtime.    Marland Kitchen venlafaxine (EFFEXOR) 75 MG tablet Take 75 mg by mouth daily.     . Vitamin D, Ergocalciferol, (DRISDOL) 50000 units CAPS capsule Take 50,000 Units by mouth every 7 (seven) days. Friday    . amiodarone (PACERONE) 200 MG tablet Take 1 tablet (200 mg total) by mouth daily. (Patient not taking: No sig reported) 30 tablet 6  . gabapentin (NEURONTIN) 100 MG capsule Take 100 mg by mouth at bedtime. (Patient not taking: Reported on 01/20/2021)    . HYDROcodone-acetaminophen (NORCO/VICODIN) 5-325 MG tablet Take 1 tablet by mouth every 12 (twelve) hours as needed for moderate pain. 60 tablet 0  . metaxalone (SKELAXIN)  800 MG tablet Take by mouth. (Patient not taking: Reported on 01/20/2021)    . predniSONE (DELTASONE) 10 MG tablet Take by mouth. (Patient not taking: Reported on 01/20/2021)    . traMADol (ULTRAM) 50 MG tablet Take 50 mg by mouth every 6 (six) hours as needed. (Patient not taking: Reported on 01/20/2021)     No current facility-administered medications for this visit.    PHYSICAL EXAMINATION: ECOG PERFORMANCE STATUS: 1 - Symptomatic but completely ambulatory  BP 140/78   Pulse 79   Temp 98 F (36.7 C)   Resp 14   Wt 104 lb 8 oz (47.4 kg)   SpO2 100%   BMI 19.11 kg/m   Filed Weights   01/20/21 1431  Weight: 104 lb 8 oz (47.4 kg)    Physical Exam Vitals and nursing note reviewed.  HENT:     Head: Normocephalic and atraumatic.     Mouth/Throat:     Pharynx: Oropharynx is clear.  Eyes:     Extraocular Movements: Extraocular movements intact.     Pupils: Pupils are equal, round, and reactive to light.  Cardiovascular:     Rate and Rhythm: Normal rate and regular rhythm.  Pulmonary:     Comments: Decreased breath sounds bilaterally.  Abdominal:      Palpations: Abdomen is soft.  Musculoskeletal:        General: Normal range of motion.     Cervical back: Normal range of motion.  Skin:    General: Skin is warm.  Neurological:     General: No focal deficit present.     Mental Status: She is alert and oriented to person, place, and time.  Psychiatric:        Behavior: Behavior normal.        Judgment: Judgment normal.       LABORATORY DATA:  I have reviewed the data as listed    Component Value Date/Time   NA 137 01/20/2021 1553   NA 141 10/21/2012 0339   K 3.8 01/20/2021 1553   K 4.7 10/21/2012 0339   CL 100 01/20/2021 1553   CL 112 (H) 10/21/2012 0339   CO2 26 01/20/2021 1553   CO2 22 10/21/2012 0339   GLUCOSE 77 01/20/2021 1553   GLUCOSE 125 (H) 10/21/2012 0339   BUN 18 01/20/2021 1553   BUN 20 (H) 10/21/2012 0339   CREATININE 0.87 01/20/2021 1553   CREATININE 0.91 10/21/2012 0339   CALCIUM 9.4 01/20/2021 1553   CALCIUM 8.3 (L) 10/21/2012 0339   PROT 7.8 01/20/2021 1553   PROT 7.6 10/18/2012 0855   ALBUMIN 4.0 01/20/2021 1553   ALBUMIN 3.8 10/18/2012 0855   AST 20 01/20/2021 1553   AST 82 (H) 10/18/2012 0855   ALT 8 01/20/2021 1553   ALT 37 10/18/2012 0855   ALKPHOS 92 01/20/2021 1553   ALKPHOS 96 10/18/2012 0855   BILITOT 0.2 (L) 01/20/2021 1553   BILITOT 0.7 10/18/2012 0855   GFRNONAA >60 01/20/2021 1553   GFRNONAA >60 10/21/2012 0339   GFRAA >60 10/21/2012 0339    No results found for: SPEP, UPEP  Lab Results  Component Value Date   WBC 6.0 01/20/2021   NEUTROABS 3.9 01/20/2021   HGB 11.9 (L) 01/20/2021   HCT 36.7 01/20/2021   MCV 91.8 01/20/2021   PLT 302 01/20/2021      Chemistry      Component Value Date/Time   NA 137 01/20/2021 1553   NA 141 10/21/2012 0339   K 3.8 01/20/2021  1553   K 4.7 10/21/2012 0339   CL 100 01/20/2021 1553   CL 112 (H) 10/21/2012 0339   CO2 26 01/20/2021 1553   CO2 22 10/21/2012 0339   BUN 18 01/20/2021 1553   BUN 20 (H) 10/21/2012 0339   CREATININE 0.87  01/20/2021 1553   CREATININE 0.91 10/21/2012 0339      Component Value Date/Time   CALCIUM 9.4 01/20/2021 1553   CALCIUM 8.3 (L) 10/21/2012 0339   ALKPHOS 92 01/20/2021 1553   ALKPHOS 96 10/18/2012 0855   AST 20 01/20/2021 1553   AST 82 (H) 10/18/2012 0855   ALT 8 01/20/2021 1553   ALT 37 10/18/2012 0855   BILITOT 0.2 (L) 01/20/2021 1553   BILITOT 0.7 10/18/2012 0855       RADIOGRAPHIC STUDIES: I have personally reviewed the radiological images as listed and agreed with the findings in the report. No results found.   ASSESSMENT & PLAN:  Cancer, metastatic to bone ([place]) # Squamous cell carcinoma bone metastatic to T12- s/p Biopsy.  The origin primary tumor is unclear.  Do not suspect patient's prior history of breast cancer/melanoma to be the cause of patient's lesions in T12 vertebral-given the difference in histologies.  I recommend a PET scan for further evaluation ASAP.  Recommend labs.  History of smoking-currently quit.  #Patient understands that she will need systemic therapy based upon above work-up/PET scan.  We will also need MRI brain.   #Back pain: Secondary to underlying malignancy/await above work-up.  Recommend evaluation with Dr. Donella Stade for radiation.  Given the metastatic disease/ongoing pain-we will take over patient's pain management [prior with Dr.Chasnis].  Prescription for hydrocodone 1 pill every 12 hours as needed nightly.   # Thank you Dr. Cari Caraway for allowing me to participate in the care of your pleasant patient. Please do not hesitate to contact me with questions or concerns in the interim.  # DISPOSITION: # refer to Dr.Crystal re: bone metastases # PET scan ASAP # labs today- cbc/cmp- # follow up 1-2 days after the PET scan- Dr.B  # I reviewed the blood work- with the patient in detail; also reviewed the imaging independently [as summarized above]; and with the patient in detail.     Orders Placed This Encounter  Procedures  . NM PET Image  Initial (PI) Skull Base To Thigh (F-18 FDG)    Standing Status:   Future    Standing Expiration Date:   01/20/2022    Order Specific Question:   If indicated for the ordered procedure, I authorize the administration of a radiopharmaceutical per Radiology protocol    Answer:   Yes    Order Specific Question:   Preferred imaging location?    Answer:   [facility]    Order Specific Question:   Radiology Contrast Protocol - do NOT remove file path    Answer:   [email or web address]  . CBC with Differential/Platelet    Standing Status:   Future    Number of Occurrences:   1    Standing Expiration Date:   01/20/2022  . Comprehensive metabolic panel    Standing Status:   Future    Number of Occurrences:   1    Standing Expiration Date:   01/20/2022   All questions were answered. The patient knows to call the clinic with any problems, questions or concerns.      Cammie Sickle, MD 01/20/2021 4:41 PM

## 2021-01-20 NOTE — Progress Notes (Signed)
Pt states she went to the pain clinic, however has ran out of mediation and is wondering if Dr. B is able to help her get more.

## 2021-01-21 ENCOUNTER — Other Ambulatory Visit: Payer: Self-pay | Admitting: Internal Medicine

## 2021-01-21 DIAGNOSIS — C7951 Secondary malignant neoplasm of bone: Secondary | ICD-10-CM

## 2021-01-21 DIAGNOSIS — C801 Malignant (primary) neoplasm, unspecified: Secondary | ICD-10-CM

## 2021-01-22 ENCOUNTER — Ambulatory Visit
Admission: RE | Admit: 2021-01-22 | Discharge: 2021-01-22 | Disposition: A | Discharge status: Home / Self Care | Payer: Medicare Other | Source: Ambulatory Visit | Attending: Radiation Oncology | Admitting: Radiation Oncology

## 2021-01-22 ENCOUNTER — Other Ambulatory Visit: Payer: Self-pay

## 2021-01-22 ENCOUNTER — Ambulatory Visit
Admission: RE | Admit: 2021-01-22 | Discharge: 2021-01-22 | Disposition: A | Discharge status: Home / Self Care | Payer: Medicare Other | Source: Ambulatory Visit | Attending: Internal Medicine | Admitting: Internal Medicine

## 2021-01-22 VITALS — BP 119/86 | HR 96 | Temp 96.0°F | Resp 16 | Wt 103.4 lb

## 2021-01-22 DIAGNOSIS — Z7901 Long term (current) use of anticoagulants: Secondary | ICD-10-CM | POA: Insufficient documentation

## 2021-01-22 DIAGNOSIS — Z79899 Other long term (current) drug therapy: Secondary | ICD-10-CM | POA: Diagnosis not present

## 2021-01-22 DIAGNOSIS — C3411 Malignant neoplasm of upper lobe, right bronchus or lung: Secondary | ICD-10-CM | POA: Insufficient documentation

## 2021-01-22 DIAGNOSIS — R911 Solitary pulmonary nodule: Secondary | ICD-10-CM | POA: Insufficient documentation

## 2021-01-22 DIAGNOSIS — I4891 Unspecified atrial fibrillation: Secondary | ICD-10-CM | POA: Insufficient documentation

## 2021-01-22 DIAGNOSIS — M109 Gout, unspecified: Secondary | ICD-10-CM | POA: Insufficient documentation

## 2021-01-22 DIAGNOSIS — C7951 Secondary malignant neoplasm of bone: Secondary | ICD-10-CM

## 2021-01-22 DIAGNOSIS — Z87891 Personal history of nicotine dependence: Secondary | ICD-10-CM | POA: Diagnosis not present

## 2021-01-22 DIAGNOSIS — I2510 Atherosclerotic heart disease of native coronary artery without angina pectoris: Secondary | ICD-10-CM | POA: Diagnosis not present

## 2021-01-22 DIAGNOSIS — Z85820 Personal history of malignant melanoma of skin: Secondary | ICD-10-CM | POA: Insufficient documentation

## 2021-01-22 DIAGNOSIS — Z803 Family history of malignant neoplasm of breast: Secondary | ICD-10-CM | POA: Diagnosis not present

## 2021-01-22 DIAGNOSIS — K219 Gastro-esophageal reflux disease without esophagitis: Secondary | ICD-10-CM | POA: Diagnosis not present

## 2021-01-22 DIAGNOSIS — I456 Pre-excitation syndrome: Secondary | ICD-10-CM | POA: Insufficient documentation

## 2021-01-22 DIAGNOSIS — E785 Hyperlipidemia, unspecified: Secondary | ICD-10-CM | POA: Insufficient documentation

## 2021-01-22 DIAGNOSIS — C801 Malignant (primary) neoplasm, unspecified: Secondary | ICD-10-CM | POA: Insufficient documentation

## 2021-01-22 LAB — GLUCOSE, CAPILLARY: Glucose-Capillary: 86 mg/dL (ref 70–99)

## 2021-01-22 IMAGING — CT NM PET TUM IMG INITIAL (PI) SKULL BASE T - THIGH
1 of 9 series · 1 of 25 positions shown · non-contrast
Comparison: [DATE] abdominopelvic CT.

CLINICAL DATA: Initial treatment strategy for osseous metastasis
from unknown primary. Recent biopsy of T12 vertebral body. History
of breast cancer.

EXAM:
NUCLEAR MEDICINE PET SKULL BASE TO THIGH
TECHNIQUE: 5.8 mCi F-18 FDG was injected intravenously. Full-ring PET imaging
was performed from the skull base to thigh after the radiotracer. CT
data was obtained and used for attenuation correction and anatomic
localization.
Fasting blood glucose: 86 mg/dl

[Series 3: ct wb 5.0 b30f · axial · 5.0mm · 0.98mm/px · 1 of 290 slices shown]
[im 290/290  brain]
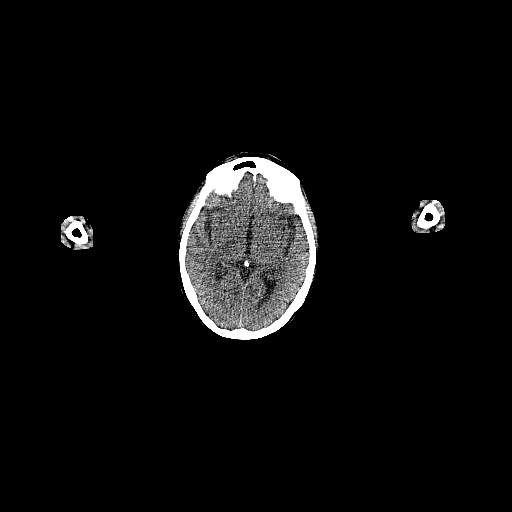

[1 of 25 positions shown; findings below may reference images not displayed]

FINDINGS: Mediastinal blood pool activity: SUV max

Liver activity: SUV max NA

NECK: No areas of abnormal hypermetabolism.

Incidental CT findings: Bilateral carotid atherosclerosis. No
cervical adenopathy.

CHEST: No thoracic nodal hypermetabolism. Right upper lobe nodule
and adjacent interstitial thickening are hypermetabolic. Example
nodule at up to 1.8 cm and a S.U.V. max of 4.6 on 85/3.

Mild hypermetabolism corresponding to anterior right pleural
thickening on 93/3 at a S.U.V. max of 1.7.

Incidental CT findings: Trace right pleural fluid. Advanced bullous
emphysema. Biapical pleuroparenchymal scarring.

ABDOMEN/PELVIS: No abdominopelvic parenchymal or nodal
hypermetabolism.

Incidental CT findings: Normal adrenal glands. Too small to
characterize left renal lesions with a lower pole left renal 3.3 cm
cyst. Abdominal aortic atherosclerosis. Hysterectomy. Large colonic
stool burden.

SKELETON: The T12 lesion detailed on prior CT and MRI is
hypermetabolic, including at a S.U.V. max of 7.6 on 135/3.

Anterior right seventh rib hypermetabolism corresponds to subtle
sclerosis including at a S.U.V. max of 4.3 on 127/3.

Incidental CT findings: Mild osteopenia.  Left clavicular fixation.
IMPRESSION: 1. Right upper lobe hypermetabolic pulmonary nodule and adjacent
interstitial thickening, most consistent with primary bronchogenic
carcinoma.
2. T12 and right seventh rib osseous metastasis.
3. Trace right pleural fluid.
4. Incidental findings, including: Aortic atherosclerosis
([YU]-[YU]), coronary artery atherosclerosis and emphysema
([YU]-[YU]).

## 2021-01-22 MED ORDER — FLUDEOXYGLUCOSE F - 18 (FDG) INJECTION
5.3000 | Freq: Once | INTRAVENOUS | Status: AC
  Administered 2021-01-22: 5.87 via INTRAVENOUS

## 2021-01-22 NOTE — Consult Note (Signed)
NEW PATIENT EVALUATION  Name: Sarah Li  MRN: [id number]  Date:   01/22/2021     DOB: 1941/01/29   This 80 y.o. female patient presents to the clinic for initial evaluation of metastatic disease to lumbar spine and sacrum.  REFERRING PHYSICIAN: Idelle Crouch, MD  CHIEF COMPLAINT:  Chief Complaint  Patient presents with   Cancer    Initial consultation    DIAGNOSIS: The encounter diagnosis was Bone metastasis ([place]).   PREVIOUS INVESTIGATIONS:  MRI scans and CT scans reviewed PET scan to be performed today Clinical notes reviewed Pathology pending  HPI: Patient is a 80 year old female with history of breast cancer status post radiation therapy to her right breast.  She also has a history of melanoma of the right cheek.  She been having mid back and lower back pain over the past several months.  Eventually she underwent a CT scan which showed a T12 sclerotic lesion and MRI scan showing infiltrative lesion of the right aspect of T12 vertebral body with extraosseous extension into the adjacent right posterior elements and paraspinous soft tissue.  This is highly concerning for neoplastic process.  She also had probable additional involvement of the right aspect of L1 vertebral body inferiorly.  She is having a PET CT scan today.  She underwent CT-guided trocar needle biopsy which was positive for squamous cell carcinoma.  She is ambulating well and is still having significant back pain.  She is now referred to radiation oncology for consideration of palliative treatment.  Work-up in process is PET CT scan to try to ascertain the initial primary tumor.  PLANNED TREATMENT REGIMEN: Palliative radiation therapy to T12-L1  PAST MEDICAL HISTORY:  has a past medical history of Atrial fibrillation ([place]), Breast cancer ([place]) (2002), Carotid bruit, Coronary artery disease, Dental bridge present, Depression, GERD (gastroesophageal reflux disease), Gout, Hyperlipidemia, Mitral valve  regurgitation, Personal history of radiation therapy, Raynaud's disease, and Wolff-Parkinson-White syndrome.    PAST SURGICAL HISTORY:  Past Surgical History:  Procedure Laterality Date   ABDOMINAL HYSTERECTOMY     BREAST EXCISIONAL BIOPSY Right 1980   neg bx   BREAST EXCISIONAL BIOPSY Right 2002   breast ca radation   BREAST LUMPECTOMY     CARDIOVERSION N/A 05/10/2019   Procedure: CARDIOVERSION;  Surgeon: Corey Skains, MD;  Location: [facility] OR[facility];  Service: Cardiovascular;  Laterality: N/A;   CLOSED REDUCTION CLAVICLE FRACTURE     ELECTROPHYSIOLOGIC STUDY N/A 12/02/2015   Procedure: CARDIOVERSION;  Surgeon: Dionisio David, MD;  Location: [facility];  Service: Cardiovascular;  Laterality: N/A;   PTOSIS REPAIR Bilateral 11/29/2017   Procedure: BLEPHAROPTOSIS REPAIR RESECT EX;  Surgeon: Karle Starch, MD;  Location: [facility];  Service: Ophthalmology;  Laterality: Bilateral;    FAMILY HISTORY: family history includes Breast cancer in her sister and sister; Heart attack in her brother, brother, father, mother, sister, and sister.  SOCIAL HISTORY:  reports that she has quit smoking. Her smoking use included cigarettes. She has a 30.00 pack-year smoking history. She quit smokeless tobacco use about 31 years ago. She reports that she does not drink alcohol and does not use drugs.  ALLERGIES: Crestor [rosuvastatin calcium] and Lipitor [atorvastatin]  MEDICATIONS:  Current Outpatient Medications  Medication Sig Dispense Refill   allopurinol (ZYLOPRIM) 300 MG tablet Take 300 mg by mouth 2 (two) times daily.      apixaban (ELIQUIS) 5 MG TABS tablet Take 1 tablet (5 mg total) by mouth 2 (two) times daily. [place]  tablet 11   calcium carbonate (TUMS - DOSED IN MG ELEMENTAL CALCIUM) 500 MG chewable tablet Chew 1 tablet by mouth daily.     Carboxymethylcellul-Glycerin (CLEAR EYES FOR DRY EYES) 1-0.25 % SOLN Place 1 drop into both eyes daily as needed (Dry eyes).     fluticasone (FLONASE) 50  MCG/ACT nasal spray Place 2 sprays into both nostrils daily as needed for allergies or rhinitis.     furosemide (LASIX) 20 MG tablet Take 20 mg by mouth daily as needed for fluid.      gemfibrozil (LOPID) 600 MG tablet Take 600 mg by mouth daily.      HYDROcodone-acetaminophen (NORCO/VICODIN) 5-325 MG tablet Take 1 tablet by mouth every 12 (twelve) hours as needed for moderate pain. 60 tablet 0   metoprolol succinate (TOPROL-XL) 50 MG 24 hr tablet Take 50 mg by mouth daily.     potassium chloride (KLOR-CON) 10 MEQ tablet Take by mouth.     traZODone (DESYREL) 50 MG tablet Take 50 mg by mouth at bedtime.     venlafaxine (EFFEXOR) 75 MG tablet Take 75 mg by mouth daily.      Vitamin D, Ergocalciferol, (DRISDOL) 50000 units CAPS capsule Take 50,000 Units by mouth every 7 (seven) days. Friday     amiodarone (PACERONE) 200 MG tablet Take 1 tablet (200 mg total) by mouth daily. (Patient not taking: No sig reported) 30 tablet 6   ezetimibe (ZETIA) 10 MG tablet Take 1 tablet (10 mg total) by mouth daily. 90 tablet 3   gabapentin (NEURONTIN) 100 MG capsule Take 100 mg by mouth at bedtime. (Patient not taking: No sig reported)     metaxalone (SKELAXIN) 800 MG tablet Take by mouth. (Patient not taking: No sig reported)     predniSONE (DELTASONE) 10 MG tablet Take by mouth. (Patient not taking: No sig reported)     traMADol (ULTRAM) 50 MG tablet Take 50 mg by mouth every 6 (six) hours as needed. (Patient not taking: No sig reported)     No current facility-administered medications for this encounter.    ECOG PERFORMANCE STATUS:  0 - Asymptomatic  REVIEW OF SYSTEMS: Patient denies any weight loss, fatigue, weakness, fever, chills or night sweats. Patient denies any loss of vision, blurred vision. Patient denies any ringing  of the ears or hearing loss. No irregular heartbeat. Patient denies heart murmur or history of fainting. Patient denies any chest pain or pain radiating to her upper extremities. Patient  denies any shortness of breath, difficulty breathing at night, cough or hemoptysis. Patient denies any swelling in the lower legs. Patient denies any nausea vomiting, vomiting of blood, or coffee ground material in the vomitus. Patient denies any stomach pain. Patient states has had normal bowel movements no significant constipation or diarrhea. Patient denies any dysuria, hematuria or significant nocturia. Patient denies any problems walking, swelling in the joints or loss of balance. Patient denies any skin changes, loss of hair or loss of weight. Patient denies any excessive worrying or anxiety or significant depression. Patient denies any problems with insomnia. Patient denies excessive thirst, polyuria, polydipsia. Patient denies any swollen glands, patient denies easy bruising or easy bleeding. Patient denies any recent infections, allergies or URI. Patient "s visual fields have not changed significantly in recent time.   PHYSICAL EXAM: BP 119/86 (BP Location: Left Arm, Patient Position: Sitting)   Pulse 96   Temp (!) 96 F (35.6 C) (Tympanic)   Resp 16   Wt 103 lb 6.4 oz (  46.9 kg)   BMI 18.91 kg/m  No pain is elicited on deep palpation of her spine.  Motor or sensory and DTR levels are equal and symmetric in the lower extremities.  Well-developed well-nourished patient in NAD. HEENT reveals PERLA, EOMI, discs not visualized.  Oral cavity is clear. No oral mucosal lesions are identified. Neck is clear without evidence of cervical or supraclavicular adenopathy. Lungs are clear to A&P. Cardiac examination is essentially unremarkable with regular rate and rhythm without murmur rub or thrill. Abdomen is benign with no organomegaly or masses noted. Motor sensory and DTR levels are equal and symmetric in the upper and lower extremities. Cranial nerves II through XII are grossly intact. Proprioception is intact. No peripheral adenopathy or edema is identified. No motor or sensory levels are noted. Crude  visual fields are within normal range.  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  In 80 year old female  PLAN: At this time articulated with palliative radiation therapy to her thoracic spine including T12-L1.  We will make better delineation based on her PET CT scan findings.  Risk and benefits of treatment eluding skin reaction fatigue alteration of blood counts and possible GI upset all were discussed in detail with the patient she comprehends her treatment plan well.  Noreene Filbert, MD

## 2021-01-26 ENCOUNTER — Ambulatory Visit
Admission: RE | Admit: 2021-01-26 | Discharge: 2021-01-26 | Disposition: A | Discharge status: Home / Self Care | Payer: Medicare Other | Source: Ambulatory Visit | Attending: Radiation Oncology | Admitting: Radiation Oncology

## 2021-01-26 DIAGNOSIS — Z510 Encounter for antineoplastic radiation therapy: Secondary | ICD-10-CM | POA: Diagnosis not present

## 2021-01-26 DIAGNOSIS — C7951 Secondary malignant neoplasm of bone: Secondary | ICD-10-CM | POA: Insufficient documentation

## 2021-01-26 DIAGNOSIS — C3411 Malignant neoplasm of upper lobe, right bronchus or lung: Secondary | ICD-10-CM | POA: Insufficient documentation

## 2021-01-27 ENCOUNTER — Inpatient Hospital Stay (HOSPITAL_BASED_OUTPATIENT_CLINIC_OR_DEPARTMENT_OTHER): Payer: Medicare Other | Admitting: Internal Medicine

## 2021-01-27 ENCOUNTER — Other Ambulatory Visit: Payer: Self-pay

## 2021-01-27 ENCOUNTER — Inpatient Hospital Stay: Payer: Medicare Other

## 2021-01-27 VITALS — BP 122/79 | HR 96 | Temp 98.0°F | Resp 18 | Wt 104.0 lb

## 2021-01-27 DIAGNOSIS — C7951 Secondary malignant neoplasm of bone: Secondary | ICD-10-CM | POA: Diagnosis not present

## 2021-01-27 DIAGNOSIS — R5383 Other fatigue: Secondary | ICD-10-CM

## 2021-01-27 DIAGNOSIS — C3411 Malignant neoplasm of upper lobe, right bronchus or lung: Secondary | ICD-10-CM | POA: Diagnosis not present

## 2021-01-27 NOTE — Assessment & Plan Note (Addendum)
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 PET OCT 2022-  Right upper lobe hypermetabolic pulmonary nodule and adjacent interstitial thickening, most consistent with primary bronchogeniccarcinoma. T12 and right seventh rib osseous metastasis.  #Long discussion with patient regarding the stage and overall incurability of her malignancy.  Agree with palliative radiation to the T12 lesion; discussed radiation oncology if radiation could be offered to right upper lobe nodule; also right rib lesion.   #Discussed regarding considering single agent immunotherapy based on PD-L1 status/await NGS.   I discussed the mechanism of action; The goal of therapy is palliative; and length of treatments are likely ongoing/based upon the results of the scans. Discussed the potential side effects of immunotherapy including but not limited to diarrhea; skin rash; elevated LFTs/endocrine abnormalities etc.  # Back pain: Secondary to underlying malignancy ; continue hydrocodone 1 pill every 12 hours as needed nightly. Discuss Zometa.   .# Palliative care evaluation: Introduced palliative care philosophy and services. I discussed the need for palliative care evaluation/symptom management to help quality of life in the context of incurable disease.  Patient is interested; will make referral.   # DISPOSITION: # follow up in 2 weeks- MD; labs- cbc/cmp; TSH # referral to [facility] in 2 weeks- re: palliative care-Dr.B  # I reviewed the blood work- with the patient in detail; also reviewed the imaging independently [as summarized above]; and with the patient in detail.   # 40 minutes face-to-face with the patient discussing the above plan of care; more than 50% of time spent on prognosis/ natural history; counseling and coordination.

## 2021-01-27 NOTE — Progress Notes (Signed)
[facility] OFFICE PROGRESS NOTE  Patient Care Team: Idelle Crouch, MD as PCP - General (Internal Medicine) Minna Merritts, MD as Consulting Physician (Cardiology)  Cancer Staging No matching staging information was found for the patient.   Oncology History Overview Note  DIAGNOSIS:  A. BONE, T12 VERTEBRAL BODY LESION; BIOPSY:  - METASTATIC SQUAMOUS CELL CARCINOMA.  - SEE COMMENT   1. No acute findings or explanation for the patient's symptoms. No evidence of urinary tract calculus, hydronephrosis or bowel wall thickening. 2. Indeterminate sclerotic lesion in the right aspect of the T12 vertebral body. Given the patient's history, this could reflect metastatic disease (potentially treated). No other signs of metastatic breast cancer. Consider further assessment with whole-body bone scan or thoracic MRI. 3. Left renal cysts.  # Melanoma of right cheek- s/p [15-20 years ago]   # right lumpectomy- Breast cancer [Dr.Crystal s/p RT; pills x 5 years- 2002]   Cancer, metastatic to bone ([place])  01/20/2021 Initial Diagnosis   Cancer, metastatic to bone ([facility])      HPI: Ambulating independently.  Accompanied by daughter, Krishna Heuer 80 y.o.  female pleasant patient above history of newly diagnosed metastatic lesion to T12 vertebra-is here today with results of the PET scan.  In the interim evaluated by radiation oncology.  Awaiting to start radiation to the vertebral lesion on 10/24.   Patient continues to have pain of the back.  Patient is taking 1 to 2 pills of hydrocodone.  Review of Systems  Constitutional:  Positive for malaise/fatigue. Negative for chills, diaphoresis, fever and weight loss.  HENT:  Negative for nosebleeds and sore throat.   Eyes:  Negative for double vision.  Respiratory:  Negative for cough, hemoptysis, sputum production, shortness of breath and wheezing.   Cardiovascular:  Negative for chest pain, palpitations, orthopnea  and leg swelling.  Gastrointestinal:  Negative for abdominal pain, blood in stool, constipation, diarrhea, heartburn, melena, nausea and vomiting.  Genitourinary:  Negative for dysuria, frequency and urgency.  Musculoskeletal:  Positive for back pain. Negative for joint pain.  Skin: Negative.  Negative for itching and rash.  Neurological:  Negative for dizziness, tingling, focal weakness, weakness and headaches.  Endo/Heme/Allergies:  Does not bruise/bleed easily.  Psychiatric/Behavioral:  Negative for depression. The patient is not nervous/anxious and does not have insomnia.      PAST MEDICAL HISTORY :  Past Medical History:  Diagnosis Date   Atrial fibrillation ([place])    Breast cancer ([place]) 2002   positive   Carotid bruit    Coronary artery disease    Dental bridge present    lower left - permanent   Depression    GERD (gastroesophageal reflux disease)    Gout    Hyperlipidemia    Mitral valve regurgitation    Personal history of radiation therapy    Raynaud's disease    Wolff-Parkinson-White syndrome     PAST SURGICAL HISTORY :   Past Surgical History:  Procedure Laterality Date   ABDOMINAL HYSTERECTOMY     BREAST EXCISIONAL BIOPSY Right 1980   neg bx   BREAST EXCISIONAL BIOPSY Right 2002   breast ca radation   BREAST LUMPECTOMY     CARDIOVERSION N/A 05/10/2019   Procedure: CARDIOVERSION;  Surgeon: Corey Skains, MD;  Location: [facility];  Service: Cardiovascular;  Laterality: N/A;   [place] STUDY N/A 12/02/2015   Procedure: CARDIOVERSION;  Surgeon: Dionisio David, MD;  Location: [facility];  Service: Cardiovascular;  Laterality: N/A;   PTOSIS REPAIR Bilateral 11/29/2017   Procedure: BLEPHAROPTOSIS REPAIR RESECT EX;  Surgeon: Karle Starch, MD;  Location: [facility];  Service: Ophthalmology;  Laterality: Bilateral;    FAMILY HISTORY :   Family History  Problem Relation Age of Onset   Heart attack Mother     Heart attack Father    Breast cancer Sister    Heart attack Sister    Breast cancer Sister    Heart attack Sister    Heart attack Brother    Heart attack Brother     SOCIAL HISTORY:   Social History   Tobacco Use   Smoking status: Former    Packs/day: 1.00    Years: 30.00    Pack years: 30.00    Types: Cigarettes   Smokeless tobacco: Former    Quit date: 12/01/1989  Vaping Use   Vaping Use: Never used  Substance Use Topics   Alcohol use: No   Drug use: No    ALLERGIES:  is allergic to crestor [rosuvastatin calcium] and lipitor [atorvastatin].  MEDICATIONS:  Current Outpatient Medications  Medication Sig Dispense Refill   allopurinol (ZYLOPRIM) 300 MG tablet Take 300 mg by mouth 2 (two) times daily.      apixaban (ELIQUIS) 5 MG TABS tablet Take 1 tablet (5 mg total) by mouth 2 (two) times daily. 60 tablet 11   calcium carbonate (TUMS - DOSED IN MG ELEMENTAL CALCIUM) 500 MG chewable tablet Chew 1 tablet by mouth daily.     Carboxymethylcellul-Glycerin (CLEAR EYES FOR DRY EYES) 1-0.25 % SOLN Place 1 drop into both eyes daily as needed (Dry eyes).     fluticasone (FLONASE) 50 MCG/ACT nasal spray Place 2 sprays into both nostrils daily as needed for allergies or rhinitis.     furosemide (LASIX) 20 MG tablet Take 20 mg by mouth daily as needed for fluid.      gemfibrozil (LOPID) 600 MG tablet Take 600 mg by mouth daily.      HYDROcodone-acetaminophen (NORCO/VICODIN) 5-325 MG tablet Take 1 tablet by mouth every 12 (twelve) hours as needed for moderate pain. 60 tablet 0   metoprolol succinate (TOPROL-XL) 50 MG 24 hr tablet Take 50 mg by mouth daily.     potassium chloride (KLOR-CON) 10 MEQ tablet Take by mouth.     traZODone (DESYREL) 50 MG tablet Take 50 mg by mouth at bedtime.     venlafaxine (EFFEXOR) 75 MG tablet Take 75 mg by mouth daily.      Vitamin D, Ergocalciferol, (DRISDOL) 50000 units CAPS capsule Take 50,000 Units by mouth every 7 (seven) days. Friday     amiodarone  (PACERONE) 200 MG tablet Take 1 tablet (200 mg total) by mouth daily. (Patient not taking: No sig reported) 30 tablet 6   ezetimibe (ZETIA) 10 MG tablet Take 1 tablet (10 mg total) by mouth daily. 90 tablet 3   gabapentin (NEURONTIN) 100 MG capsule Take 100 mg by mouth at bedtime. (Patient not taking: No sig reported)     metaxalone (SKELAXIN) 800 MG tablet Take by mouth. (Patient not taking: No sig reported)     predniSONE (DELTASONE) 10 MG tablet Take by mouth. (Patient not taking: No sig reported)     traMADol (ULTRAM) 50 MG tablet Take 50 mg by mouth every 6 (six) hours as needed. (Patient not taking: No sig reported)     No current facility-administered medications for this visit.  PHYSICAL EXAMINATION: ECOG PERFORMANCE STATUS: 1 - Symptomatic but completely ambulatory  BP 122/79 (BP Location: Left Arm, Patient Position: Sitting)   Pulse 96   Temp 98 F (36.7 C) (Tympanic)   Resp 18   Wt 104 lb (47.2 kg)   SpO2 99%   BMI 19.02 kg/m   Filed Weights   01/27/21 1439  Weight: 104 lb (47.2 kg)    Physical Exam Vitals and nursing note reviewed.  HENT:     Head: Normocephalic and atraumatic.     Mouth/Throat:     Pharynx: Oropharynx is clear.  Eyes:     Extraocular Movements: Extraocular movements intact.     Pupils: Pupils are equal, round, and reactive to light.  Cardiovascular:     Rate and Rhythm: Normal rate and regular rhythm.  Pulmonary:     Comments: Decreased breath sounds bilaterally.  Abdominal:     Palpations: Abdomen is soft.  Musculoskeletal:        General: Normal range of motion.     Cervical back: Normal range of motion.  Skin:    General: Skin is warm.  Neurological:     General: No focal deficit present.     Mental Status: She is alert and oriented to person, place, and time.  Psychiatric:        Behavior: Behavior normal.        Judgment: Judgment normal.       LABORATORY DATA:  I have reviewed the data as listed    Component Value  Date/Time   NA 137 01/20/2021 1553   NA 141 10/21/2012 0339   K 3.8 01/20/2021 1553   K 4.7 10/21/2012 0339   CL 100 01/20/2021 1553   CL 112 (H) 10/21/2012 0339   CO2 26 01/20/2021 1553   CO2 22 10/21/2012 0339   GLUCOSE 77 01/20/2021 1553   GLUCOSE 125 (H) 10/21/2012 0339   BUN 18 01/20/2021 1553   BUN 20 (H) 10/21/2012 0339   CREATININE 0.87 01/20/2021 1553   CREATININE 0.91 10/21/2012 0339   CALCIUM 9.4 01/20/2021 1553   CALCIUM 8.3 (L) 10/21/2012 0339   PROT 7.8 01/20/2021 1553   PROT 7.6 10/18/2012 0855   ALBUMIN 4.0 01/20/2021 1553   ALBUMIN 3.8 10/18/2012 0855   AST 20 01/20/2021 1553   AST 82 (H) 10/18/2012 0855   ALT 8 01/20/2021 1553   ALT 37 10/18/2012 0855   ALKPHOS 92 01/20/2021 1553   ALKPHOS 96 10/18/2012 0855   BILITOT 0.2 (L) 01/20/2021 1553   BILITOT 0.7 10/18/2012 0855   GFRNONAA >60 01/20/2021 1553   GFRNONAA >60 10/21/2012 0339   GFRAA >60 10/21/2012 0339    No results found for: SPEP, UPEP  Lab Results  Component Value Date   WBC 6.0 01/20/2021   NEUTROABS 3.9 01/20/2021   HGB 11.9 (L) 01/20/2021   HCT 36.7 01/20/2021   MCV 91.8 01/20/2021   PLT 302 01/20/2021      Chemistry      Component Value Date/Time   NA 137 01/20/2021 1553   NA 141 10/21/2012 0339   K 3.8 01/20/2021 1553   K 4.7 10/21/2012 0339   CL 100 01/20/2021 1553   CL 112 (H) 10/21/2012 0339   CO2 26 01/20/2021 1553   CO2 22 10/21/2012 0339   BUN 18 01/20/2021 1553   BUN 20 (H) 10/21/2012 0339   CREATININE 0.87 01/20/2021 1553   CREATININE 0.91 10/21/2012 0339      Component Value Date/Time  CALCIUM 9.4 01/20/2021 1553   CALCIUM 8.3 (L) 10/21/2012 0339   ALKPHOS 92 01/20/2021 1553   ALKPHOS 96 10/18/2012 0855   AST 20 01/20/2021 1553   AST 82 (H) 10/18/2012 0855   ALT 8 01/20/2021 1553   ALT 37 10/18/2012 0855   BILITOT 0.2 (L) 01/20/2021 1553   BILITOT 0.7 10/18/2012 0855       RADIOGRAPHIC STUDIES: I have personally reviewed the radiological images as  listed and agreed with the findings in the report. No results found.   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 PET OCT 2022-  Right upper lobe hypermetabolic pulmonary nodule and adjacent interstitial thickening, most consistent with primary bronchogeniccarcinoma. T12 and right seventh rib osseous metastasis.  #Long discussion with patient regarding the stage and overall incurability of her malignancy.  Agree with palliative radiation to the T12 lesion; discussed radiation oncology if radiation could be offered to right upper lobe nodule; also right rib lesion.   #Discussed regarding considering single agent immunotherapy based on PD-L1 status/await NGS.   I discussed the mechanism of action; The goal of therapy is palliative; and length of treatments are likely ongoing/based upon the results of the scans. Discussed the potential side effects of immunotherapy including but not limited to diarrhea; skin rash; elevated LFTs/endocrine abnormalities etc.  # Back pain: Secondary to underlying malignancy ; continue hydrocodone 1 pill every 12 hours as needed nightly. Discuss Zometa.   .# Palliative care evaluation: Introduced palliative care philosophy and services. I discussed the need for palliative care evaluation/symptom management to help quality of life in the context of incurable disease.  Patient is interested; will make referral.   # DISPOSITION: # follow up in 2 weeks- MD; labs- cbc/cmp; TSH # referral to [facility] in 2 weeks- re: palliative care-Dr.B  # I reviewed the blood work- with the patient in detail; also reviewed the imaging independently [as summarized above]; and with the patient in detail.   # 40 minutes face-to-face with the patient discussing the above plan of care; more than 50% of time spent on prognosis/ natural history; counseling and  coordination.       Orders Placed This Encounter  Procedures   CBC with Differential    Standing Status:   Future    Standing Expiration Date:   01/27/2022   Comprehensive metabolic panel    Standing Status:   Future    Standing Expiration Date:   01/27/2022   TSH    Standing Status:   Future    Standing Expiration Date:   01/27/2022   All questions were answered. The patient knows to call the clinic with any problems, questions or concerns.      Cammie Sickle, MD 01/27/2021 4:22 PM

## 2021-01-28 ENCOUNTER — Encounter: Payer: Self-pay | Admitting: *Deleted

## 2021-01-28 NOTE — Progress Notes (Signed)
Foundation One with PDL1 testing ordered per Dr. Brahmanday. Order form faxed to pathology dept to arrange specimen shipment. 

## 2021-01-29 ENCOUNTER — Other Ambulatory Visit: Payer: Medicare Other

## 2021-01-29 ENCOUNTER — Ambulatory Visit: Admission: RE | Admit: 2021-01-29 | Payer: Medicare Other | Source: Ambulatory Visit

## 2021-01-29 DIAGNOSIS — Z510 Encounter for antineoplastic radiation therapy: Secondary | ICD-10-CM | POA: Diagnosis not present

## 2021-02-02 ENCOUNTER — Ambulatory Visit
Admission: RE | Admit: 2021-02-02 | Discharge: 2021-02-02 | Disposition: A | Discharge status: Home / Self Care | Payer: Medicare Other | Source: Ambulatory Visit | Attending: Radiation Oncology | Admitting: Radiation Oncology

## 2021-02-02 DIAGNOSIS — Z510 Encounter for antineoplastic radiation therapy: Secondary | ICD-10-CM | POA: Diagnosis not present

## 2021-02-03 ENCOUNTER — Ambulatory Visit
Admission: RE | Admit: 2021-02-03 | Discharge: 2021-02-03 | Disposition: A | Discharge status: Home / Self Care | Payer: Medicare Other | Source: Ambulatory Visit | Attending: Radiation Oncology | Admitting: Radiation Oncology

## 2021-02-03 ENCOUNTER — Encounter: Payer: Self-pay | Admitting: Internal Medicine

## 2021-02-03 DIAGNOSIS — Z510 Encounter for antineoplastic radiation therapy: Secondary | ICD-10-CM | POA: Diagnosis not present

## 2021-02-04 ENCOUNTER — Ambulatory Visit
Admission: RE | Admit: 2021-02-04 | Discharge: 2021-02-04 | Disposition: A | Discharge status: Home / Self Care | Payer: Medicare Other | Source: Ambulatory Visit | Attending: Radiation Oncology | Admitting: Radiation Oncology

## 2021-02-04 DIAGNOSIS — Z510 Encounter for antineoplastic radiation therapy: Secondary | ICD-10-CM | POA: Diagnosis not present

## 2021-02-05 ENCOUNTER — Ambulatory Visit
Admission: RE | Admit: 2021-02-05 | Discharge: 2021-02-05 | Disposition: A | Discharge status: Home / Self Care | Payer: Medicare Other | Source: Ambulatory Visit | Attending: Radiation Oncology | Admitting: Radiation Oncology

## 2021-02-05 DIAGNOSIS — Z510 Encounter for antineoplastic radiation therapy: Secondary | ICD-10-CM | POA: Diagnosis not present

## 2021-02-06 ENCOUNTER — Ambulatory Visit
Admission: RE | Admit: 2021-02-06 | Discharge: 2021-02-06 | Disposition: A | Discharge status: Home / Self Care | Payer: Medicare Other | Source: Ambulatory Visit | Attending: Radiation Oncology | Admitting: Radiation Oncology

## 2021-02-06 ENCOUNTER — Encounter: Payer: Self-pay | Admitting: Internal Medicine

## 2021-02-06 DIAGNOSIS — Z510 Encounter for antineoplastic radiation therapy: Secondary | ICD-10-CM | POA: Diagnosis not present

## 2021-02-09 ENCOUNTER — Ambulatory Visit
Admission: RE | Admit: 2021-02-09 | Discharge: 2021-02-09 | Disposition: A | Discharge status: Home / Self Care | Payer: Medicare Other | Source: Ambulatory Visit | Attending: Radiation Oncology | Admitting: Radiation Oncology

## 2021-02-09 DIAGNOSIS — Z510 Encounter for antineoplastic radiation therapy: Secondary | ICD-10-CM | POA: Diagnosis not present

## 2021-02-10 ENCOUNTER — Inpatient Hospital Stay: Payer: Medicare Other | Attending: Internal Medicine

## 2021-02-10 ENCOUNTER — Telehealth: Payer: Self-pay

## 2021-02-10 ENCOUNTER — Inpatient Hospital Stay (HOSPITAL_BASED_OUTPATIENT_CLINIC_OR_DEPARTMENT_OTHER): Payer: Medicare Other | Admitting: Internal Medicine

## 2021-02-10 ENCOUNTER — Other Ambulatory Visit: Payer: Self-pay

## 2021-02-10 ENCOUNTER — Ambulatory Visit
Admission: RE | Admit: 2021-02-10 | Discharge: 2021-02-10 | Disposition: A | Discharge status: Home / Self Care | Payer: Medicare Other | Source: Ambulatory Visit | Attending: Radiation Oncology | Admitting: Radiation Oncology

## 2021-02-10 DIAGNOSIS — C7951 Secondary malignant neoplasm of bone: Secondary | ICD-10-CM | POA: Insufficient documentation

## 2021-02-10 DIAGNOSIS — C3411 Malignant neoplasm of upper lobe, right bronchus or lung: Secondary | ICD-10-CM | POA: Insufficient documentation

## 2021-02-10 DIAGNOSIS — Z79899 Other long term (current) drug therapy: Secondary | ICD-10-CM | POA: Insufficient documentation

## 2021-02-10 DIAGNOSIS — Z510 Encounter for antineoplastic radiation therapy: Secondary | ICD-10-CM | POA: Insufficient documentation

## 2021-02-10 DIAGNOSIS — R5383 Other fatigue: Secondary | ICD-10-CM

## 2021-02-10 DIAGNOSIS — Z87891 Personal history of nicotine dependence: Secondary | ICD-10-CM | POA: Diagnosis not present

## 2021-02-10 LAB — CBC WITH DIFFERENTIAL/PLATELET
Abs Immature Granulocytes: 0.05 10*3/uL (ref 0.00–0.07)
Basophils Absolute: 0 10*3/uL (ref 0.0–0.1)
Basophils Relative: 0 %
Eosinophils Absolute: 0 10*3/uL (ref 0.0–0.5)
Eosinophils Relative: 1 %
HCT: 37.1 % (ref 36.0–46.0)
Hemoglobin: 12.2 g/dL (ref 12.0–15.0)
Immature Granulocytes: 1 %
Lymphocytes Relative: 11 %
Lymphs Abs: 0.7 10*3/uL (ref 0.7–4.0)
MCH: 30 pg (ref 26.0–34.0)
MCHC: 32.9 g/dL (ref 30.0–36.0)
MCV: 91.2 fL (ref 80.0–100.0)
Monocytes Absolute: 0.7 10*3/uL (ref 0.1–1.0)
Monocytes Relative: 10 %
Neutro Abs: 5 10*3/uL (ref 1.7–7.7)
Neutrophils Relative %: 77 %
Platelets: 262 10*3/uL (ref 150–400)
RBC: 4.07 MIL/uL (ref 3.87–5.11)
RDW: 14.3 % (ref 11.5–15.5)
WBC: 6.5 10*3/uL (ref 4.0–10.5)
nRBC: 0 % (ref 0.0–0.2)

## 2021-02-10 LAB — COMPREHENSIVE METABOLIC PANEL
ALT: 12 U/L (ref 0–44)
AST: 22 U/L (ref 15–41)
Albumin: 3.7 g/dL (ref 3.5–5.0)
Alkaline Phosphatase: 90 U/L (ref 38–126)
Anion gap: 13 (ref 5–15)
BUN: 14 mg/dL (ref 8–23)
CO2: 28 mmol/L (ref 22–32)
Calcium: 9.2 mg/dL (ref 8.9–10.3)
Chloride: 98 mmol/L (ref 98–111)
Creatinine, Ser: 0.87 mg/dL (ref 0.44–1.00)
GFR, Estimated: >60 mL/min (ref >60)
Glucose, Bld: 105 mg/dL — ABNORMAL HIGH (ref 70–99)
Potassium: 3.8 mmol/L (ref 3.5–5.1)
Sodium: 139 mmol/L (ref 135–145)
Total Bilirubin: 0.5 mg/dL (ref 0.3–1.2)
Total Protein: 7 g/dL (ref 6.5–8.1)

## 2021-02-10 LAB — TSH: TSH: 1.077 u[IU]/mL (ref 0.350–4.500)

## 2021-02-10 MED ORDER — PREDNISONE 20 MG PO TABS: 20.0000 mg | ORAL_TABLET | Freq: Every day | ORAL | 0 refills | Status: DC

## 2021-02-10 MED ORDER — HYDROCODONE-ACETAMINOPHEN 5-325 MG PO TABS: 1.0000 | ORAL_TABLET | Freq: Three times a day (TID) | ORAL | 0 refills | Status: DC | PRN

## 2021-02-10 NOTE — Progress Notes (Signed)
START ON PATHWAY REGIMEN - Non-Small Cell Lung     A cycle is every 21 days:     Pembrolizumab      Paclitaxel      Carboplatin   **Always confirm dose/schedule in your pharmacy ordering system**  Patient Characteristics: Stage IV Metastatic, Squamous, Molecular Analysis Completed, Alteration Present and Targeted Therapy Exhausted or EGFR Exon 20 Insertion or KRAS G12C or HER2 Present, and No Prior Chemo/Immunotherapy or No Alteration Present, PS = 0, 1, Initial  Chemotherapy/Immunotherapy, No Alteration Present, Immunotherapy Candidate, PD-L1 Expression Positive 1-49% (TPS) / Negative / Not Tested / Awaiting Test Results Therapeutic Status: Stage IV Metastatic Histology: Squamous Cell Molecular Analysis Results: No Alteration Present ECOG Performance Status: 1 Chemotherapy/Immunotherapy Line of Therapy: Initial Chemotherapy/Immunotherapy Immunotherapy Candidate Status: Candidate for Immunotherapy PD-L1 Expression Status: PD-L1 Positive 1-49% (TPS) Intent of Therapy: Non-Curative / Palliative Intent, Discussed with Patient 

## 2021-02-10 NOTE — Telephone Encounter (Signed)
Patient needs clearance to be off of her eliquis for 48 hours for port placement.

## 2021-02-10 NOTE — Progress Notes (Signed)
[place] OFFICE PROGRESS NOTE  Patient Care Team: Idelle Crouch, MD as PCP - General (Internal Medicine) Minna Merritts, MD as Consulting Physician (Cardiology)  Cancer Staging Cancer, metastatic to bone [facility]) Staging form: Bone - Appendicular Skeleton, Trunk, Skull, and Facial Bones, AJCC 8th Edition - Clinical: No stage assigned - Unsigned  Primary cancer of right upper lobe of lung ([place]) Staging form: Lung, AJCC 8th Edition - Clinical: Stage IVA (cT2, cN0, cM1b) - Signed by Cammie Sickle, MD on 02/19/2021   Oncology History Overview Note  DIAGNOSIS:  A. BONE, T12 VERTEBRAL BODY LESION; BIOPSY:  - METASTATIC SQUAMOUS CELL CARCINOMA.  - SEE COMMENT   1. No acute findings or explanation for the patient's symptoms. No evidence of urinary tract calculus, hydronephrosis or bowel wall thickening. 2. Indeterminate sclerotic lesion in the right aspect of the T12 vertebral body. Given the patient's history, this could reflect metastatic disease (potentially treated). No other signs of metastatic breast cancer. Consider further assessment with whole-body bone scan or thoracic MRI. 3. Left renal cysts.  # Melanoma of right cheek- s/p [15-20 years ago]   # right lumpectomy- Breast cancer [Dr.Crystal s/p RT; pills x 5 years- 2002]   Cancer, metastatic to bone ([place])  01/20/2021 Initial Diagnosis   Cancer, metastatic to bone ([place])   02/10/2021 -  Chemotherapy   Patient is on Treatment Plan : LUNG NSCLC flat dose Pembrolizumab Q21D     Primary cancer of right upper lobe of lung ([place])  02/19/2021 Initial Diagnosis   Primary cancer of right upper lobe of lung ([place])   02/19/2021 Cancer Staging   Staging form: Lung, AJCC 8th Edition - Clinical: Stage IVA (cT2, cN0, cM1b) - Signed by Cammie Sickle, MD on 02/19/2021       HPI: Ambulating independently.  Accompanied by daughter, Rodolfo Notaro 80 y.o.  female pleasant patient above  history of newly diagnosed metastatic lesion to T12 vertebra-is here for follow-up.  Negative patient was evaluated by radiation oncology-currently getting radiation to the back.    Patient continues to have pain of the back.  Patient is taking 1 to 2 pills of hydrocodone.  Review of Systems  Constitutional:  Positive for malaise/fatigue. Negative for chills, diaphoresis, fever and weight loss.  HENT:  Negative for nosebleeds and sore throat.   Eyes:  Negative for double vision.  Respiratory:  Negative for cough, hemoptysis, sputum production, shortness of breath and wheezing.   Cardiovascular:  Negative for chest pain, palpitations, orthopnea and leg swelling.  Gastrointestinal:  Negative for abdominal pain, blood in stool, constipation, diarrhea, heartburn, melena, nausea and vomiting.  Genitourinary:  Negative for dysuria, frequency and urgency.  Musculoskeletal:  Positive for back pain. Negative for joint pain.  Skin: Negative.  Negative for itching and rash.  Neurological:  Negative for dizziness, tingling, focal weakness, weakness and headaches.  Endo/Heme/Allergies:  Does not bruise/bleed easily.  Psychiatric/Behavioral:  Negative for depression. The patient is not nervous/anxious and does not have insomnia.      PAST MEDICAL HISTORY :  Past Medical History:  Diagnosis Date   Atrial fibrillation ([place])    Breast cancer ([place]) 2002   positive   Carotid bruit    Coronary artery disease    Dental bridge present    lower left - permanent   Depression    GERD (gastroesophageal reflux disease)    Gout    Hyperlipidemia    Mitral valve regurgitation  Personal history of radiation therapy    Raynaud's disease    Wolff-Parkinson-White syndrome     PAST SURGICAL HISTORY :   Past Surgical History:  Procedure Laterality Date   ABDOMINAL HYSTERECTOMY     BREAST EXCISIONAL BIOPSY Right 1980   neg bx   BREAST EXCISIONAL BIOPSY Right 2002   breast ca radation   BREAST  LUMPECTOMY     CARDIOVERSION N/A 05/10/2019   Procedure: CARDIOVERSION;  Surgeon: Corey Skains, MD;  Location: [facility] ORS;  Service: Cardiovascular;  Laterality: N/A;   CLOSED REDUCTION CLAVICLE FRACTURE     ELECTROPHYSIOLOGIC STUDY N/A 12/02/2015   Procedure: CARDIOVERSION;  Surgeon: Dionisio David, MD;  Location: [facility] ORS;  Service: Cardiovascular;  Laterality: N/A;   IR IMAGING GUIDED PORT INSERTION  02/18/2021   PTOSIS REPAIR Bilateral 11/29/2017   Procedure: BLEPHAROPTOSIS REPAIR RESECT EX;  Surgeon: Karle Starch, MD;  Location: [facility];  Service: Ophthalmology;  Laterality: Bilateral;    FAMILY HISTORY :   Family History  Problem Relation Age of Onset   Heart attack Mother    Heart attack Father    Breast cancer Sister    Heart attack Sister    Breast cancer Sister    Heart attack Sister    Heart attack Brother    Heart attack Brother     SOCIAL HISTORY:   Social History   Tobacco Use   Smoking status: Former    Packs/day: 1.00    Years: 30.00    Pack years: 30.00    Types: Cigarettes    Quit date: 1991    Years since quitting: 31.8   Smokeless tobacco: Former    Quit date: 12/01/1989  Vaping Use   Vaping Use: Never used  Substance Use Topics   Alcohol use: No   Drug use: No    ALLERGIES:  is allergic to crestor [rosuvastatin calcium] and lipitor [atorvastatin].  MEDICATIONS:  Current Outpatient Medications  Medication Sig Dispense Refill   allopurinol (ZYLOPRIM) 300 MG tablet Take 300 mg by mouth daily.     apixaban (ELIQUIS) 5 MG TABS tablet Take 1 tablet (5 mg total) by mouth 2 (two) times daily. 60 tablet 11   calcium carbonate (TUMS - DOSED IN MG ELEMENTAL CALCIUM) 500 MG chewable tablet Chew 1 tablet by mouth daily.     Carboxymethylcellul-Glycerin (CLEAR EYES FOR DRY EYES) 1-0.25 % SOLN Place 1 drop into both eyes daily as needed (Dry eyes).     fluticasone (FLONASE) 50 MCG/ACT nasal spray Place 2 sprays into both nostrils daily as needed  for allergies or rhinitis.     furosemide (LASIX) 20 MG tablet Take 20 mg by mouth daily as needed for fluid.      gemfibrozil (LOPID) 600 MG tablet Take 600 mg by mouth daily.      metoprolol succinate (TOPROL-XL) 50 MG 24 hr tablet Take 50 mg by mouth daily.     potassium chloride (KLOR-CON) 10 MEQ tablet Take by mouth.     traZODone (DESYREL) 50 MG tablet Take 50 mg by mouth at bedtime.     venlafaxine (EFFEXOR) 75 MG tablet Take 75 mg by mouth daily.      Vitamin D, Ergocalciferol, (DRISDOL) 50000 units CAPS capsule Take 50,000 Units by mouth every 7 (seven) days. Friday     amiodarone (PACERONE) 200 MG tablet Take 1 tablet (200 mg total) by mouth daily. (Patient not taking: No sig reported) 30 tablet 6   ezetimibe (ZETIA)  10 MG tablet Take 1 tablet (10 mg total) by mouth daily. 90 tablet 3   gabapentin (NEURONTIN) 100 MG capsule Take 100 mg by mouth at bedtime.     HYDROcodone-acetaminophen (NORCO/VICODIN) 5-325 MG tablet Take 1 tablet by mouth every 8 (eight) hours as needed for moderate pain. 90 tablet 0   metaxalone (SKELAXIN) 800 MG tablet Take by mouth. (Patient not taking: No sig reported)     predniSONE (DELTASONE) 20 MG tablet Take 1 tablet (20 mg total) by mouth daily with breakfast. 30 tablet 0   traMADol (ULTRAM) 50 MG tablet Take 50 mg by mouth every 6 (six) hours as needed. (Patient not taking: No sig reported)     No current facility-administered medications for this visit.    PHYSICAL EXAMINATION: ECOG PERFORMANCE STATUS: 1 - Symptomatic but completely ambulatory  BP 121/77 (Patient Position: Sitting)   Pulse 84   Temp 98 F (36.7 C) (Tympanic)   Resp 18   Wt 102 lb (46.3 kg)   SpO2 99%   BMI 18.66 kg/m   Filed Weights   02/10/21 1402  Weight: 102 lb (46.3 kg)    Physical Exam Vitals and nursing note reviewed.  HENT:     Head: Normocephalic and atraumatic.     Mouth/Throat:     Pharynx: Oropharynx is clear.  Eyes:     Extraocular Movements: Extraocular  movements intact.     Pupils: Pupils are equal, round, and reactive to light.  Cardiovascular:     Rate and Rhythm: Normal rate and regular rhythm.  Pulmonary:     Comments: Decreased breath sounds bilaterally.  Abdominal:     Palpations: Abdomen is soft.  Musculoskeletal:        General: Normal range of motion.     Cervical back: Normal range of motion.  Skin:    General: Skin is warm.  Neurological:     General: No focal deficit present.     Mental Status: She is alert and oriented to person, place, and time.  Psychiatric:        Behavior: Behavior normal.        Judgment: Judgment normal.       LABORATORY DATA:  I have reviewed the data as listed    Component Value Date/Time   NA 139 02/10/2021 1329   NA 141 10/21/2012 0339   K 3.8 02/10/2021 1329   K 4.7 10/21/2012 0339   CL 98 02/10/2021 1329   CL 112 (H) 10/21/2012 0339   CO2 28 02/10/2021 1329   CO2 22 10/21/2012 0339   GLUCOSE 105 (H) 02/10/2021 1329   GLUCOSE 125 (H) 10/21/2012 0339   BUN 14 02/10/2021 1329   BUN 20 (H) 10/21/2012 0339   CREATININE 0.87 02/10/2021 1329   CREATININE 0.91 10/21/2012 0339   CALCIUM 9.2 02/10/2021 1329   CALCIUM 8.3 (L) 10/21/2012 0339   PROT 7.0 02/10/2021 1329   PROT 7.6 10/18/2012 0855   ALBUMIN 3.7 02/10/2021 1329   ALBUMIN 3.8 10/18/2012 0855   AST 22 02/10/2021 1329   AST 82 (H) 10/18/2012 0855   ALT 12 02/10/2021 1329   ALT 37 10/18/2012 0855   ALKPHOS 90 02/10/2021 1329   ALKPHOS 96 10/18/2012 0855   BILITOT 0.5 02/10/2021 1329   BILITOT 0.7 10/18/2012 0855   GFRNONAA >60 02/10/2021 1329   GFRNONAA >60 10/21/2012 0339   GFRAA >60 10/21/2012 0339    No results found for: SPEP, UPEP  Lab Results  Component Value Date  WBC 6.5 02/10/2021   NEUTROABS 5.0 02/10/2021   HGB 12.2 02/10/2021   HCT 37.1 02/10/2021   MCV 91.2 02/10/2021   PLT 262 02/10/2021      Chemistry      Component Value Date/Time   NA 139 02/10/2021 1329   NA 141 10/21/2012 0339    K 3.8 02/10/2021 1329   K 4.7 10/21/2012 0339   CL 98 02/10/2021 1329   CL 112 (H) 10/21/2012 0339   CO2 28 02/10/2021 1329   CO2 22 10/21/2012 0339   BUN 14 02/10/2021 1329   BUN 20 (H) 10/21/2012 0339   CREATININE 0.87 02/10/2021 1329   CREATININE 0.91 10/21/2012 0339      Component Value Date/Time   CALCIUM 9.2 02/10/2021 1329   CALCIUM 8.3 (L) 10/21/2012 0339   ALKPHOS 90 02/10/2021 1329   ALKPHOS 96 10/18/2012 0855   AST 22 02/10/2021 1329   AST 82 (H) 10/18/2012 0855   ALT 12 02/10/2021 1329   ALT 37 10/18/2012 0855   BILITOT 0.5 02/10/2021 1329   BILITOT 0.7 10/18/2012 0855       RADIOGRAPHIC STUDIES: I have personally reviewed the radiological images as listed and agreed with the findings in the report. IR IMAGING GUIDED PORT INSERTION  Result Date: 02/18/2021 INDICATION: 80 year old with metastatic lung cancer. Port-A-Cath needed for chemotherapy. EXAM: FLUOROSCOPIC AND ULTRASOUND GUIDED PLACEMENT OF A SUBCUTANEOUS PORT. Physician: Stephan Minister. Henn, MD MEDICATIONS: Moderate sedation ANESTHESIA/SEDATION: Moderate (conscious) sedation was employed during this procedure. A total of Versed 1.0mg  and fentanyl 25 mcg was administered intravenously at the order of the provider performing the procedure. Total intra-service moderate sedation time: 30 minutes. Patient's level of consciousness and vital signs were monitored continuously by radiology nurse throughout the procedure under the supervision of the provider performing the procedure. FLUOROSCOPY TIME:  1 minute, 2.5 mGy COMPLICATIONS: None immediate. PROCEDURE: The procedure was explained to the patient. The risks and benefits of the procedure were discussed and the patient's questions were addressed. Informed consent was obtained from the patient. Patient was placed supine on the interventional table. Ultrasound confirmed a patent left internal jugular vein. Ultrasound image was saved for documentation. The left chest and neck were  cleaned with a skin antiseptic and a sterile drape was placed. Maximal barrier sterile technique was utilized including caps, mask, sterile gowns, sterile gloves, sterile drape, hand hygiene and skin antiseptic. The left neck was anesthetized with 1% lidocaine. Small incision was made in the left neck with a blade. Micropuncture set was placed in the left IJ with ultrasound guidance. The micropuncture wire was used for measurement purposes. The left chest was anesthetized with 1% lidocaine with epinephrine. #15 blade was used to make an incision and a subcutaneous port pocket was formed. Annabella was assembled. Subcutaneous tunnel was formed with a stiff tunneling device. The port catheter was brought through the subcutaneous tunnel. The port was placed in the subcutaneous pocket. The micropuncture set was exchanged for a peel-away sheath. The catheter was placed through the peel-away sheath and the tip was positioned at the superior cavoatrial junction. Catheter placement was confirmed with fluoroscopy. The port was accessed and flushed with heparinized saline. The port pocket was closed using two layers of absorbable sutures and Dermabond. The vein skin site was closed using a single layer of absorbable suture and Dermabond. Sterile dressings were applied. Patient tolerated the procedure well without an immediate complication. Ultrasound and fluoroscopic images were taken and saved for this procedure.  IMPRESSION: Placement of a subcutaneous port device. Catheter tip at the superior cavoatrial junction. Electronically Signed   By: Markus Daft M.D.   On: 02/18/2021 12:50     ASSESSMENT & PLAN:  Cancer, metastatic to bone ([place])  .          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 PET OCT 2022-  Right upper lobe hypermetabolic pulmonary nodule and adjacent interstitial thickening,  most consistent with primary bronchogeniccarcinoma. T12 and right seventh rib osseous metastasis.  # Currently palliative radiation to the T12 lesion [11/04]; awaiting  RT  right upper lobe nodule-start next week [until 11/21].  #Patient borderline candidate for chemoimmunotherapy.  TPS 1-candidate for [facility] immunotherapy.  We will start patient on Keytruda after finishing radiation to the lung.  I discussed the mechanism of action; The goal of therapy is palliative; and length of treatments are likely ongoing/based upon the results of the scans. Discussed the potential side effects of immunotherapy including but not limited to diarrhea; skin rash; elevated LFTs/endocrine abnormalities etc.  # Back pain: Secondary to underlying malignancy ; continue hydrocodone 1 pill every 12 hours as needed nightly. Discuss Zometa.   .# Palliative care evaluation: Introduced palliative care philosophy and services. I discussed the need for palliative care evaluation/symptom management to help quality of life in the context of incurable disease.   #IV access discussed: Recommend port placement.   # DISPOSITION: # referral to IR for port placement # referral to [facility] this week/coordinate with [facility] appt re: palliative care # follow up 11/28- MD; labs- cbc/cmp;Keytruda-Dr.B   Orders Placed This Encounter  Procedures   IR IMAGING GUIDED PORT INSERTION    Standing Status:   Future    Number of Occurrences:   1    Standing Expiration Date:   02/10/2022    Order Specific Question:   Reason for Exam (SYMPTOM  OR DIAGNOSIS REQUIRED)    Answer:   needs port placed prior to chemo poor venous access    Order Specific Question:   Preferred Imaging Location?    Answer:   [facility]   CBC with Differential    Standing Status:   Future    Standing Expiration Date:   02/10/2022   Comprehensive metabolic panel    Standing Status:   Future    Standing Expiration Date:   02/10/2022   Ambulatory Referral to  Palliative Care    Referral Priority:   Routine    Referral Type:   Consultation    Referral Reason:   Advance Care Planning    Number of Visits Requested:   1   All questions were answered. The patient knows to call the clinic with any problems, questions or concerns.      Cammie Sickle, MD 02/19/2021 1:59 PM

## 2021-02-10 NOTE — Telephone Encounter (Signed)
I spoke with Janet/Barbara in scheduling. Eliquis clearnace is not needed for port placement. Dr. Rockey Situ, pls disregard this msg. Thanks- Nira Conn, RN

## 2021-02-11 ENCOUNTER — Telehealth: Payer: Self-pay | Admitting: *Deleted

## 2021-02-11 ENCOUNTER — Ambulatory Visit
Admission: RE | Admit: 2021-02-11 | Discharge: 2021-02-11 | Disposition: A | Discharge status: Home / Self Care | Payer: Medicare Other | Source: Ambulatory Visit | Attending: Radiation Oncology | Admitting: Radiation Oncology

## 2021-02-11 DIAGNOSIS — Z510 Encounter for antineoplastic radiation therapy: Secondary | ICD-10-CM | POA: Diagnosis not present

## 2021-02-11 NOTE — Telephone Encounter (Signed)
I spoke with Sarah Li in scheduling. Pt's port can be scheduled on 02/18/21 at 9 am. PT's radiation apt will need to be changed to make any adjustments in the schedule to put pt on sch.  I contacted pt. She is aware that port a cath will be sch. On 11/9. She would need to bring a driver to this apt as well as not eat/drink anything 6-8 hours prior to the procedure apt. She will check with radiation dept today before she leaves the office to determine her new apt time for radiation.

## 2021-02-12 ENCOUNTER — Inpatient Hospital Stay (HOSPITAL_BASED_OUTPATIENT_CLINIC_OR_DEPARTMENT_OTHER): Payer: Medicare Other | Admitting: Hospice and Palliative Medicine

## 2021-02-12 ENCOUNTER — Other Ambulatory Visit: Payer: Self-pay

## 2021-02-12 ENCOUNTER — Ambulatory Visit
Admission: RE | Admit: 2021-02-12 | Discharge: 2021-02-12 | Disposition: A | Discharge status: Home / Self Care | Payer: Medicare Other | Source: Ambulatory Visit | Attending: Radiation Oncology | Admitting: Radiation Oncology

## 2021-02-12 VITALS — BP 126/78 | HR 85 | Temp 97.6°F | Resp 18

## 2021-02-12 DIAGNOSIS — C7951 Secondary malignant neoplasm of bone: Secondary | ICD-10-CM

## 2021-02-12 DIAGNOSIS — Z515 Encounter for palliative care: Secondary | ICD-10-CM

## 2021-02-12 DIAGNOSIS — Z510 Encounter for antineoplastic radiation therapy: Secondary | ICD-10-CM | POA: Diagnosis not present

## 2021-02-12 NOTE — Progress Notes (Signed)
[place]  Telephone:([phone] [phone]   Name: Sarah Li Date: 02/12/2021 MRN: [id number]  DOB: 12/01/1940  Patient Care Team: Idelle Crouch, MD as PCP - General (Internal Medicine) Rockey Situ Kathlene November, MD as Consulting Physician (Cardiology)    REASON FOR CONSULTATION: Sarah Li is a 80 y.o. 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  Patient has T12 and right seventh rib osseous metastasis on XRT.  Plan is to initiate chemotherapy/immunotherapy.  She has had back pain and was referred to palliative care to help address goals and manage ongoing symptoms.  SOCIAL HISTORY:     reports that she has quit smoking. Her smoking use included cigarettes. She has a 30.00 pack-year smoking history. She quit smokeless tobacco use about 31 years ago. She reports that she does not drink alcohol and does not use drugs.  Patient is married and lives at home with her husband and their 82 year old Parachute named Mel Almond.  They have a daughter who lives nearby.  Patient previously worked in a Special educational needs teacher.  ADVANCE DIRECTIVES:  Not on file  CODE STATUS:   PAST MEDICAL HISTORY: Past Medical History:  Diagnosis Date   Atrial fibrillation ([place])    Breast cancer ([place]) 2002   positive   Carotid bruit    Coronary artery disease    Dental bridge present    lower left - permanent   Depression    GERD (gastroesophageal reflux disease)    Gout    Hyperlipidemia    Mitral valve regurgitation    Personal history of radiation therapy    Raynaud's disease    Wolff-Parkinson-White syndrome     PAST SURGICAL HISTORY:  Past Surgical History:  Procedure Laterality Date   ABDOMINAL HYSTERECTOMY     BREAST EXCISIONAL BIOPSY Right 1980   neg bx   BREAST EXCISIONAL BIOPSY Right 2002   breast ca radation   BREAST LUMPECTOMY     CARDIOVERSION N/A 05/10/2019   Procedure:  CARDIOVERSION;  Surgeon: Corey Skains, MD;  Location: [facility] ORS;  Service: Cardiovascular;  Laterality: N/A;   [place] STUDY N/A 12/02/2015   Procedure: CARDIOVERSION;  Surgeon: Dionisio David, MD;  Location: [facility] OR[facility];  Service: Cardiovascular;  Laterality: N/A;   PTOSIS REPAIR Bilateral 11/29/2017   Procedure: BLEPHAROPTOSIS REPAIR RESECT EX;  Surgeon: Karle Starch, MD;  Location: [place];  Service: Ophthalmology;  Laterality: Bilateral;    HEMATOLOGY/ONCOLOGY HISTORY:  Oncology History Overview Note  DIAGNOSIS:  A. BONE, T12 VERTEBRAL BODY LESION; BIOPSY:  - METASTATIC SQUAMOUS CELL CARCINOMA.  - SEE COMMENT   1. No acute findings or explanation for the patient's symptoms. No evidence of urinary tract calculus, hydronephrosis or bowel wall thickening. 2. Indeterminate sclerotic lesion in the right aspect of the T12 vertebral body. Given the patient's history, this could reflect metastatic disease (potentially treated). No other signs of metastatic breast cancer. Consider further assessment with whole-body bone scan or thoracic MRI. 3. Left renal cysts.  # Melanoma of right cheek- s/p [15-20 years ago]   # right lumpectomy- Breast cancer [Dr.Crystal s/p RT; pills x 5 years- 2002]   Cancer, metastatic to bone ([place])  01/20/2021 Initial Diagnosis   Cancer, metastatic to bone ([place])   02/10/2021 -  Chemotherapy   Patient is on Treatment Plan : LUNG NSCLC flat dose Pembrolizumab Q21D  ALLERGIES:  is allergic to crestor [rosuvastatin calcium] and lipitor [atorvastatin].  MEDICATIONS:  Current Outpatient Medications  Medication Sig Dispense Refill   allopurinol (ZYLOPRIM) 300 MG tablet Take 300 mg by mouth 2 (two) times daily.      amiodarone (PACERONE) 200 MG tablet Take 1 tablet (200 mg total) by mouth daily. (Patient not taking: No sig reported) 30 tablet 6   apixaban (ELIQUIS) 5 MG TABS tablet Take 1  tablet (5 mg total) by mouth 2 (two) times daily. 60 tablet 11   calcium carbonate (TUMS - DOSED IN MG ELEMENTAL CALCIUM) 500 MG chewable tablet Chew 1 tablet by mouth daily.     Carboxymethylcellul-Glycerin (CLEAR EYES FOR DRY EYES) 1-0.25 % SOLN Place 1 drop into both eyes daily as needed (Dry eyes).     ezetimibe (ZETIA) 10 MG tablet Take 1 tablet (10 mg total) by mouth daily. 90 tablet 3   fluticasone (FLONASE) 50 MCG/ACT nasal spray Place 2 sprays into both nostrils daily as needed for allergies or rhinitis.     furosemide (LASIX) 20 MG tablet Take 20 mg by mouth daily as needed for fluid.      gabapentin (NEURONTIN) 100 MG capsule Take 100 mg by mouth at bedtime. (Patient not taking: No sig reported)     gemfibrozil (LOPID) 600 MG tablet Take 600 mg by mouth daily.      HYDROcodone-acetaminophen (NORCO/VICODIN) 5-325 MG tablet Take 1 tablet by mouth every 8 (eight) hours as needed for moderate pain. 90 tablet 0   metaxalone (SKELAXIN) 800 MG tablet Take by mouth. (Patient not taking: No sig reported)     metoprolol succinate (TOPROL-XL) 50 MG 24 hr tablet Take 50 mg by mouth daily.     potassium chloride (KLOR-CON) 10 MEQ tablet Take by mouth.     predniSONE (DELTASONE) 20 MG tablet Take 1 tablet (20 mg total) by mouth daily with breakfast. 30 tablet 0   traMADol (ULTRAM) 50 MG tablet Take 50 mg by mouth every 6 (six) hours as needed. (Patient not taking: No sig reported)     traZODone (DESYREL) 50 MG tablet Take 50 mg by mouth at bedtime.     venlafaxine (EFFEXOR) 75 MG tablet Take 75 mg by mouth daily.      Vitamin D, Ergocalciferol, (DRISDOL) 50000 units CAPS capsule Take 50,000 Units by mouth every 7 (seven) days. Friday     No current facility-administered medications for this visit.    VITAL SIGNS: There were no vitals taken for this visit. There were no vitals filed for this visit.  Estimated body mass index is 18.66 kg/m as calculated from the following:   Height as of  01/09/21: 5' 2"  (1.575 m).   Weight as of 02/10/21: 102 lb (46.3 kg).  LABS: CBC:    Component Value Date/Time   WBC 6.5 02/10/2021 1329   HGB 12.2 02/10/2021 1329   HGB 11.0 (L) 10/21/2012 0339   HCT 37.1 02/10/2021 1329   HCT 33.1 (L) 10/21/2012 0339   PLT 262 02/10/2021 1329   PLT 289 10/21/2012 0339   MCV 91.2 02/10/2021 1329   MCV 94 10/21/2012 0339   NEUTROABS 5.0 02/10/2021 1329   NEUTROABS 7.8 (H) 10/21/2012 0339   LYMPHSABS 0.7 02/10/2021 1329   LYMPHSABS 0.5 (L) 10/21/2012 0339   MONOABS 0.7 02/10/2021 1329   MONOABS 0.5 10/21/2012 0339   EOSABS 0.0 02/10/2021 1329   EOSABS 0.0 10/21/2012 0339   BASOSABS 0.0 02/10/2021 1329   BASOSABS 0.0  10/21/2012 0339   Comprehensive Metabolic Panel:    Component Value Date/Time   NA 139 02/10/2021 1329   NA 141 10/21/2012 0339   K 3.8 02/10/2021 1329   K 4.7 10/21/2012 0339   CL 98 02/10/2021 1329   CL 112 (H) 10/21/2012 0339   CO2 28 02/10/2021 1329   CO2 22 10/21/2012 0339   BUN 14 02/10/2021 1329   BUN 20 (H) 10/21/2012 0339   CREATININE 0.87 02/10/2021 1329   CREATININE 0.91 10/21/2012 0339   GLUCOSE 105 (H) 02/10/2021 1329   GLUCOSE 125 (H) 10/21/2012 0339   CALCIUM 9.2 02/10/2021 1329   CALCIUM 8.3 (L) 10/21/2012 0339   AST 22 02/10/2021 1329   AST 82 (H) 10/18/2012 0855   ALT 12 02/10/2021 1329   ALT 37 10/18/2012 0855   ALKPHOS 90 02/10/2021 1329   ALKPHOS 96 10/18/2012 0855   BILITOT 0.5 02/10/2021 1329   BILITOT 0.7 10/18/2012 0855   PROT 7.0 02/10/2021 1329   PROT 7.6 10/18/2012 0855   ALBUMIN 3.7 02/10/2021 1329   ALBUMIN 3.8 10/18/2012 0855    RADIOGRAPHIC STUDIES: NM PET Image Initial (PI) Skull Base To Thigh (F-18 FDG)  Result Date: 01/24/2021 CLINICAL DATA:  Initial treatment strategy for osseous metastasis from unknown primary. Recent biopsy of T12 vertebral body. History of breast cancer. EXAM: NUCLEAR MEDICINE PET SKULL BASE TO THIGH TECHNIQUE: 5.8 mCi F-18 FDG was injected intravenously.  Full-ring PET imaging was performed from the skull base to thigh after the radiotracer. CT data was obtained and used for attenuation correction and anatomic localization. Fasting blood glucose: 86 mg/dl COMPARISON:  11/21/2020 abdominopelvic CT. FINDINGS: Mediastinal blood pool activity: SUV max 1.8 Liver activity: SUV max NA NECK: No areas of abnormal hypermetabolism. Incidental CT findings: Bilateral carotid atherosclerosis. No cervical adenopathy. CHEST: No thoracic nodal hypermetabolism. Right upper lobe nodule and adjacent interstitial thickening are hypermetabolic. Example nodule at up to 1.8 cm and a S.U.V. max of 4.6 on 85/3. Mild hypermetabolism corresponding to anterior right pleural thickening on 93/3 at a S.U.V. max of 1.7. Incidental CT findings: Trace right pleural fluid. Advanced bullous emphysema. Biapical pleuroparenchymal scarring. ABDOMEN/PELVIS: No abdominopelvic parenchymal or nodal hypermetabolism. Incidental CT findings: Normal adrenal glands. Too small to characterize left renal lesions with a lower pole left renal 3.3 cm cyst. Abdominal aortic atherosclerosis. Hysterectomy. Large colonic stool burden. SKELETON: The T12 lesion detailed on prior CT and MRI is hypermetabolic, including at a S.U.V. max of 7.6 on 135/3. Anterior right seventh rib hypermetabolism corresponds to subtle sclerosis including at a S.U.V. max of 4.3 on 127/3. Incidental CT findings: Mild osteopenia.  Left clavicular fixation. IMPRESSION: 1. Right upper lobe hypermetabolic pulmonary nodule and adjacent interstitial thickening, most consistent with primary bronchogenic carcinoma. 2. T12 and right seventh rib osseous metastasis. 3. Trace right pleural fluid. 4. Incidental findings, including: Aortic atherosclerosis (ICD10-I70.0), coronary artery atherosclerosis and emphysema (ICD10-J43.9). Electronically Signed   By: Abigail Miyamoto M.D.   On: 01/24/2021 10:00    PERFORMANCE STATUS (ECOG) : 1 - Symptomatic but completely  ambulatory  Review of Systems Unless otherwise noted, a complete review of systems is negative.  Physical Exam General: NAD Pulmonary: Unlabored Extremities: no edema, no joint deformities Skin: no rashes Neurological: Weakness but otherwise nonfocal  IMPRESSION: I met with patient and husband.  Introduced palliative care services and attempted to establish therapeutic rapport.  Patient reports that she is doing well.  She denies any significant symptomatic concerns at present.  She reports  that her pain is significantly improved on XRT.  She continues to take the [place] but generally only once a day.  Patient reports her appetite is poor.  We discussed the importance of high-calorie/high-protein foods.  Will refer to nutrition.  Patient states that she understands that the cancer is not curable but she is in agreement with treatment and says that she would like to "make the best of this."  She clearly has a strong desire to ensure that her quality of life is maintained.  She says that she is more worried about her family and then herself.  Patient to bring in ACP documents.  She says that she would not want to be resuscitated nor have her life prolonged artificially on machines.  I sent her home with a MOST form to review.  PLAN: -Continue current scope of treatment -Continue Norco -Referral to nutrition -Patient to bring in ACP documents -MOST Form reviewed -RTC on 11/28   Patient expressed understanding and was in agreement with this plan. She also understands that She can call the clinic at any time with any questions, concerns, or complaints.     Time Total: 20 minutes  Visit consisted of counseling and education dealing with the complex and emotionally intense issues of symptom management and palliative care in the setting of serious and potentially life-threatening illness.Greater than 50%  of this time was spent counseling and coordinating care related to the above  assessment and plan.  Signed by: Altha Harm, PhD, NP-C

## 2021-02-13 ENCOUNTER — Ambulatory Visit
Admission: RE | Admit: 2021-02-13 | Discharge: 2021-02-13 | Disposition: A | Discharge status: Home / Self Care | Payer: Medicare Other | Source: Ambulatory Visit | Attending: Radiation Oncology | Admitting: Radiation Oncology

## 2021-02-13 DIAGNOSIS — Z510 Encounter for antineoplastic radiation therapy: Secondary | ICD-10-CM | POA: Diagnosis not present

## 2021-02-16 ENCOUNTER — Other Ambulatory Visit: Payer: Self-pay | Admitting: Radiology

## 2021-02-18 ENCOUNTER — Encounter: Payer: Self-pay | Admitting: Radiology

## 2021-02-18 ENCOUNTER — Other Ambulatory Visit: Payer: Self-pay

## 2021-02-18 ENCOUNTER — Ambulatory Visit
Admission: RE | Admit: 2021-02-18 | Discharge: 2021-02-18 | Disposition: A | Discharge status: Home / Self Care | Payer: Medicare Other | Source: Ambulatory Visit | Attending: Internal Medicine | Admitting: Internal Medicine

## 2021-02-18 ENCOUNTER — Ambulatory Visit: Payer: Medicare Other

## 2021-02-18 DIAGNOSIS — C3491 Malignant neoplasm of unspecified part of right bronchus or lung: Secondary | ICD-10-CM | POA: Insufficient documentation

## 2021-02-18 DIAGNOSIS — C7951 Secondary malignant neoplasm of bone: Secondary | ICD-10-CM | POA: Insufficient documentation

## 2021-02-18 HISTORY — PX: IR IMAGING GUIDED PORT INSERTION: IMG5740

## 2021-02-18 IMAGING — US IR IMAGING GUIDED PORT INSERTION
1 series · 6 of 6 positions shown · non-contrast
Comparison: none

INDICATION: 80-year-old with metastatic lung cancer. Port-A-Cath needed for
chemotherapy.

[Series 1: ir imaging guided port insertion · 0.07mm/px · 6 of 6 slices shown]
[im 1/6]
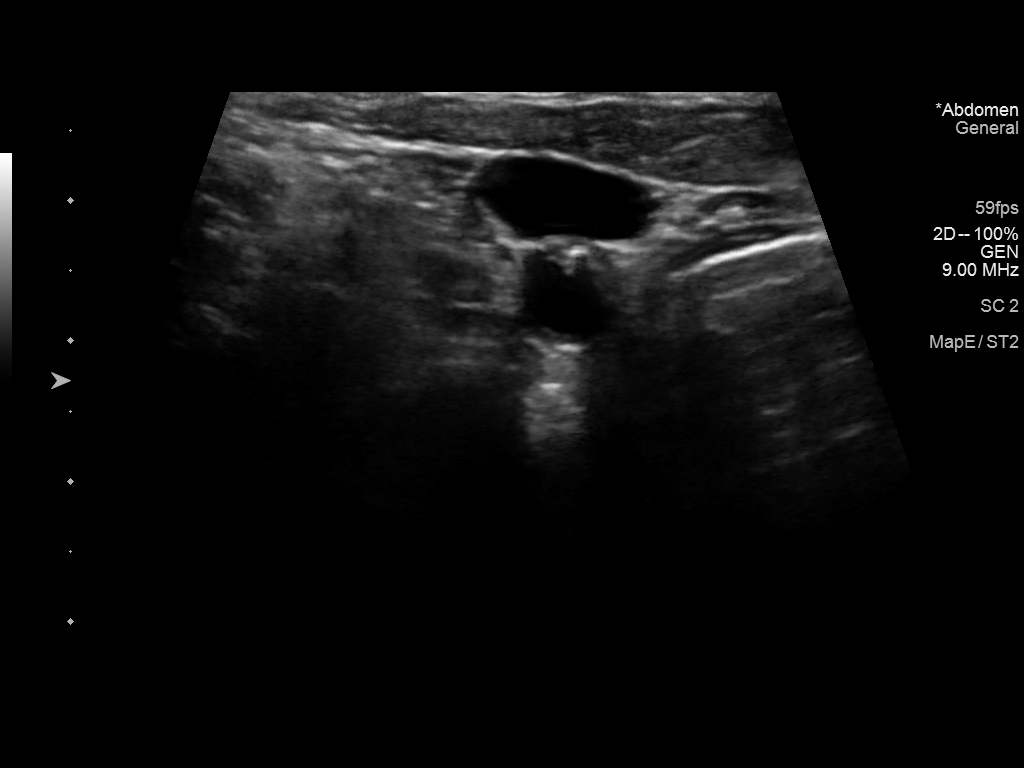
[im 2/6]
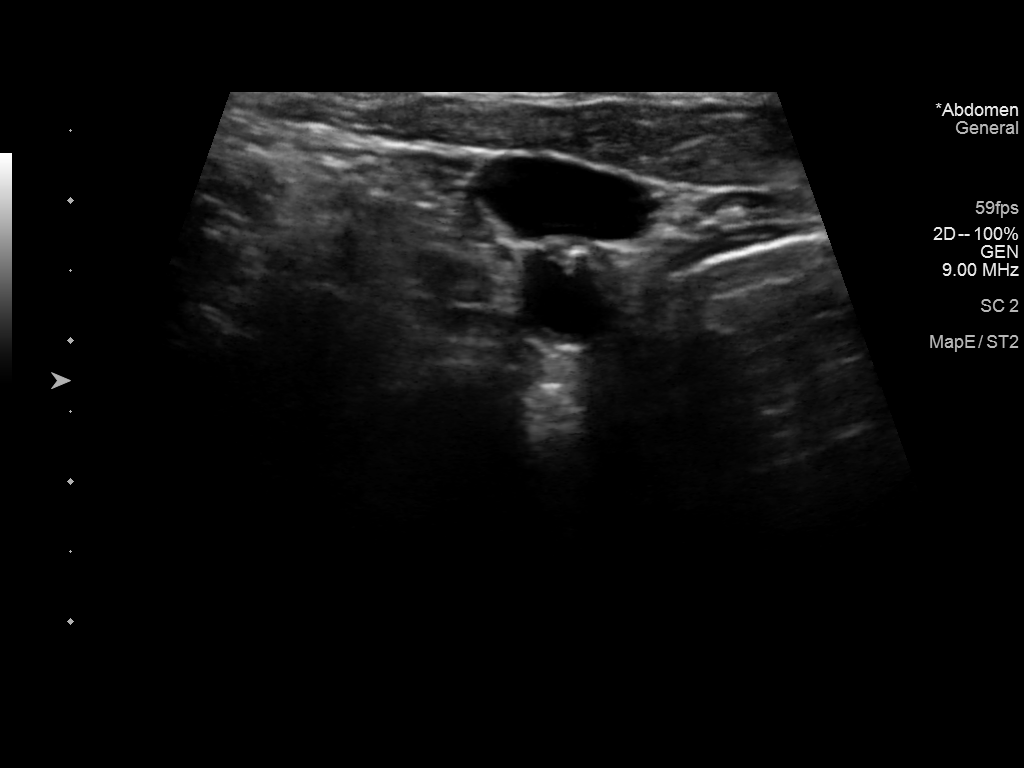
[im 3/6]
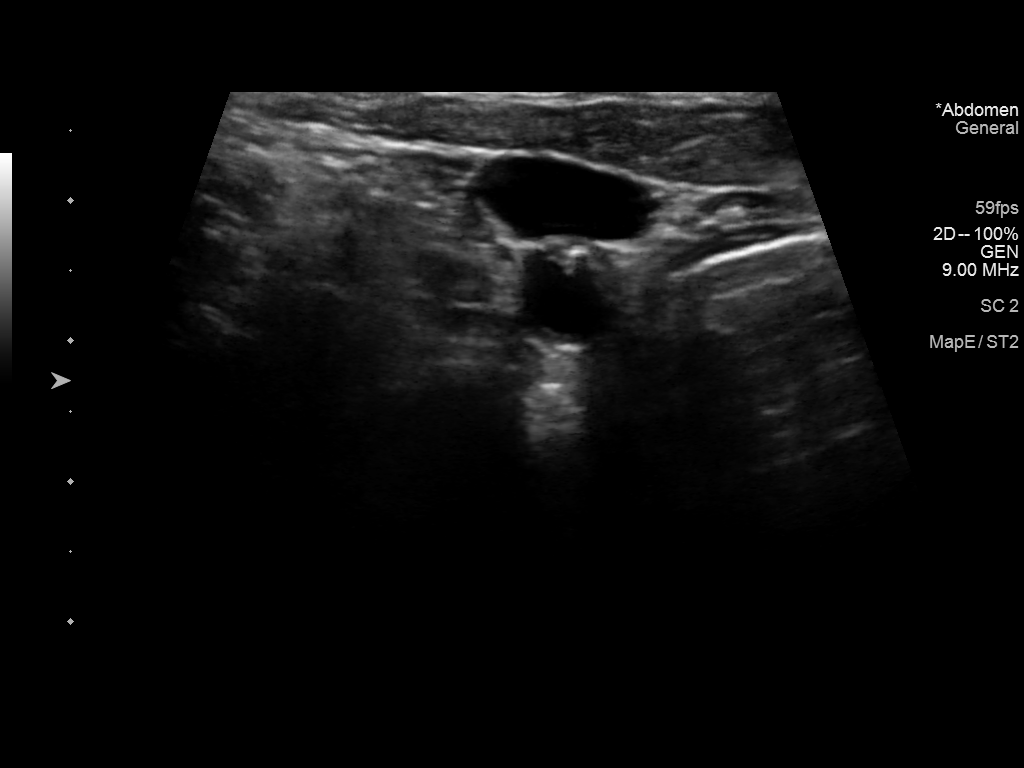
[im 4/6]
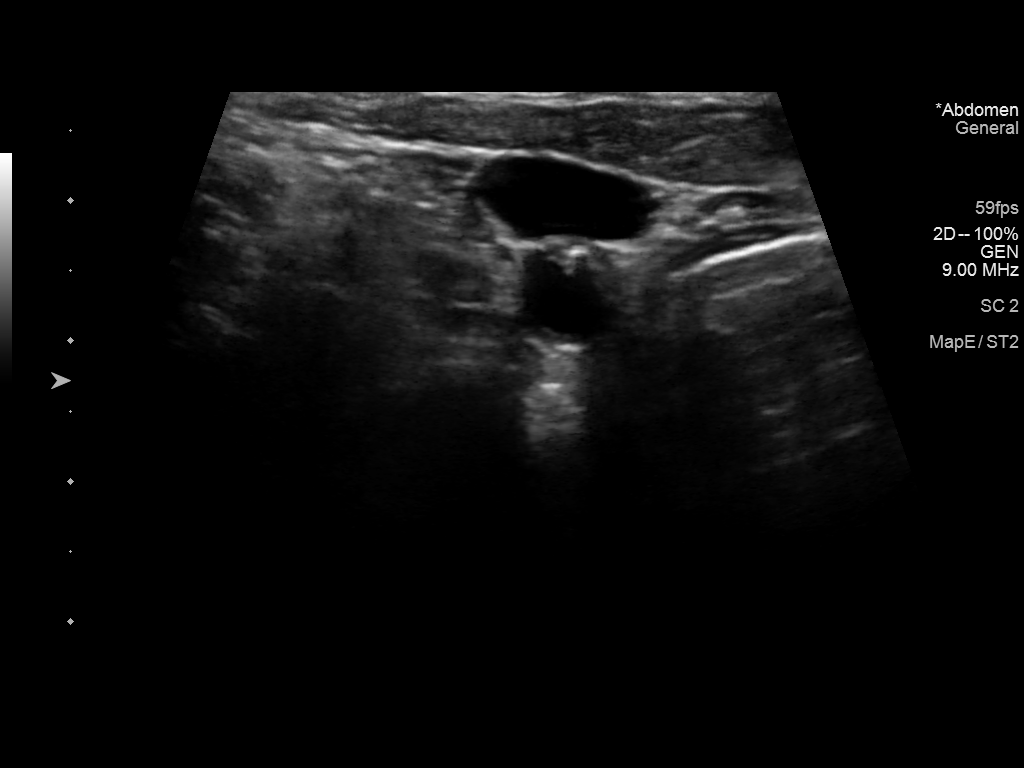
[im 5/6]
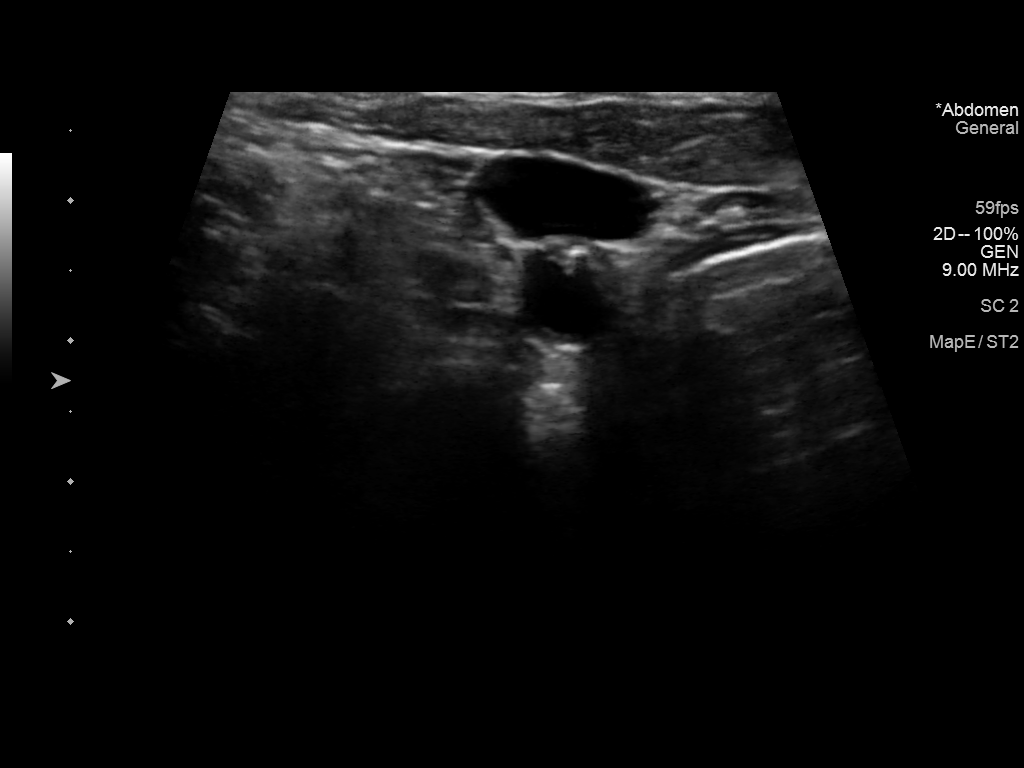
[im 6/6]
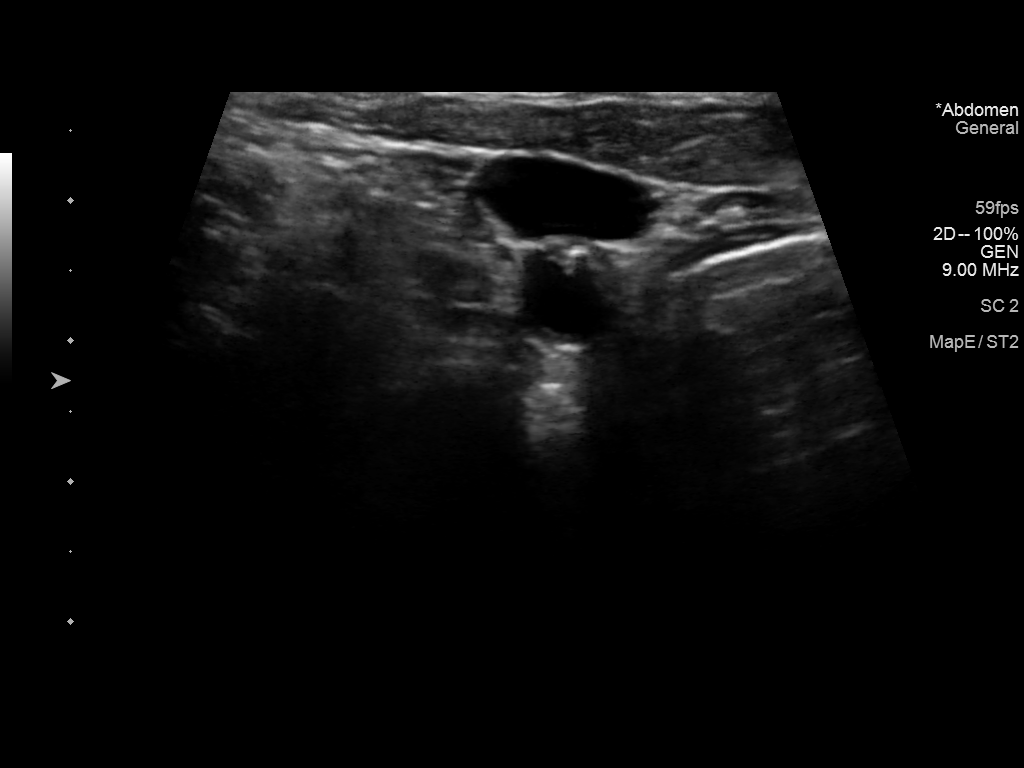

[6 of 6 positions shown; findings below may reference images not displayed]

EXAM:
FLUOROSCOPIC AND ULTRASOUND GUIDED PLACEMENT OF A SUBCUTANEOUS PORT.

MEDICATIONS:
Moderate sedation

ANESTHESIA/SEDATION:
Moderate (conscious) sedation was employed during this procedure. A
total of Versed 1.0mg and fentanyl 25 mcg was administered
intravenously at the order of the provider performing the procedure.

Total intra-service moderate sedation time: 30 minutes.

Patient's level of consciousness and vital signs were monitored
continuously by radiology nurse throughout the procedure under the
supervision of the provider performing the procedure.

FLUOROSCOPY TIME:  1 minute, 2.5 mGy

COMPLICATIONS:
None immediate.

PROCEDURE:
The procedure was explained to the patient. The risks and benefits
of the procedure were discussed and the patient's questions were
addressed. Informed consent was obtained from the patient. Patient
was placed supine on the interventional table. Ultrasound confirmed
a patent left internal jugular vein. Ultrasound image was saved for
documentation. The left chest and neck were cleaned with a skin
antiseptic and a sterile drape was placed. Maximal barrier sterile
technique was utilized including caps, mask, sterile gowns, sterile
gloves, sterile drape, hand hygiene and skin antiseptic. The left
neck was anesthetized with 1% lidocaine. Small incision was made in
the left neck with a blade. Micropuncture set was placed in the left
IJ with ultrasound guidance. The micropuncture wire was used for
measurement purposes. The left chest was anesthetized with 1%
lidocaine with epinephrine. #15 blade was used to make an incision
and a subcutaneous port pocket was formed. 8 french Power Port was
assembled. Subcutaneous tunnel was formed with a stiff tunneling
device. The port catheter was brought through the subcutaneous
tunnel. The port was placed in the subcutaneous pocket. The
micropuncture set was exchanged for a peel-away sheath. The catheter
was placed through the peel-away sheath and the tip was positioned
at the superior cavoatrial junction. Catheter placement was
confirmed with fluoroscopy. The port was accessed and flushed with
heparinized saline. The port pocket was closed using two layers of
absorbable sutures and Dermabond. The vein skin site was closed
using a single layer of absorbable suture and Dermabond. Sterile
dressings were applied. Patient tolerated the procedure well without
an immediate complication. Ultrasound and fluoroscopic images were
taken and saved for this procedure.
IMPRESSION: Placement of a subcutaneous port device. Catheter tip at the
superior cavoatrial junction.

## 2021-02-18 IMAGING — XA IR IMAGING GUIDED PORT INSERTION
1 series · 3 of 3 positions shown · non-contrast
Comparison: none

INDICATION: 80-year-old with metastatic lung cancer. Port-A-Cath needed for
chemotherapy.

[Series 1: interv standard · 2 acquisitions, 3 frames shown]
[im 1/2]
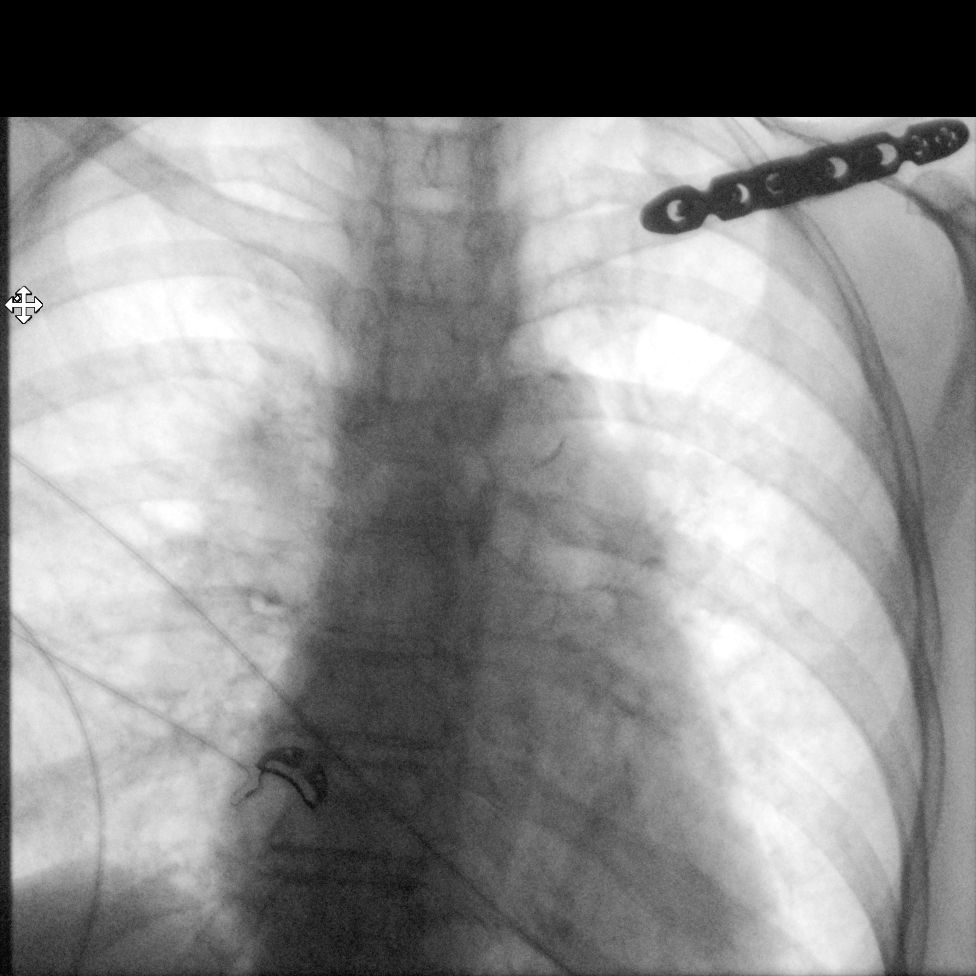
[im 2/2]
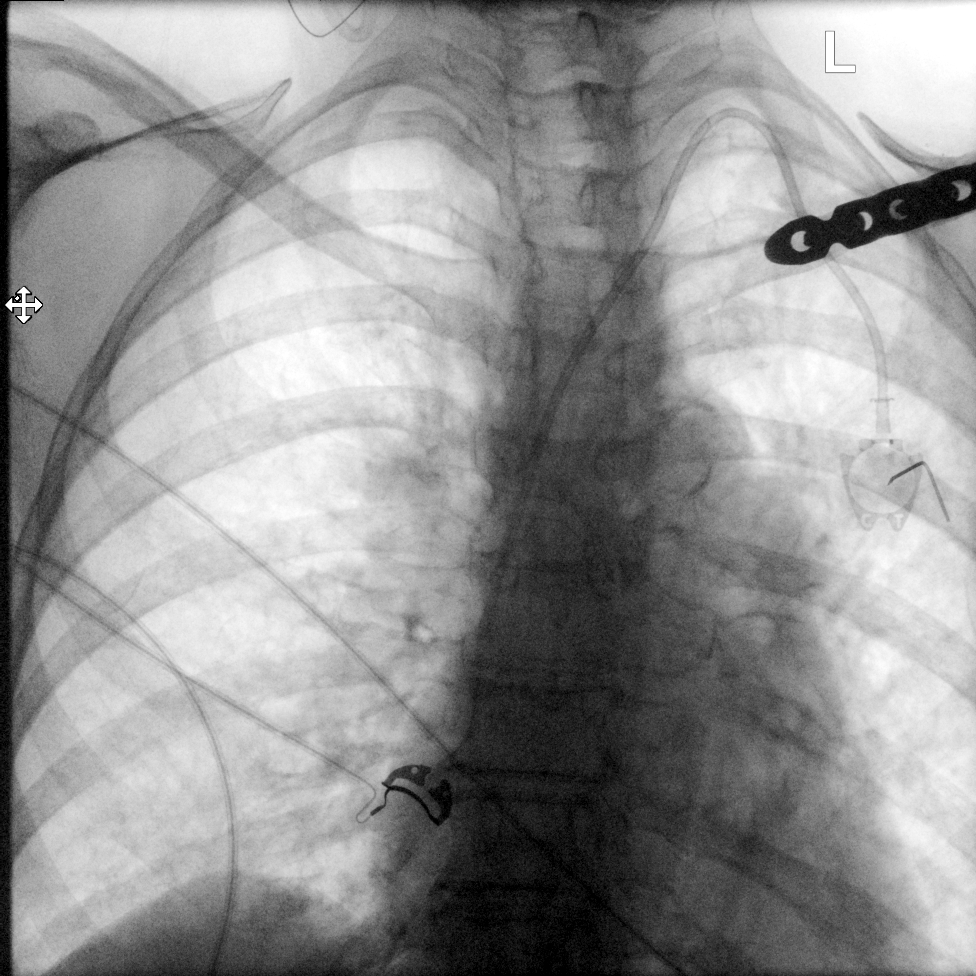
[im 2/2]
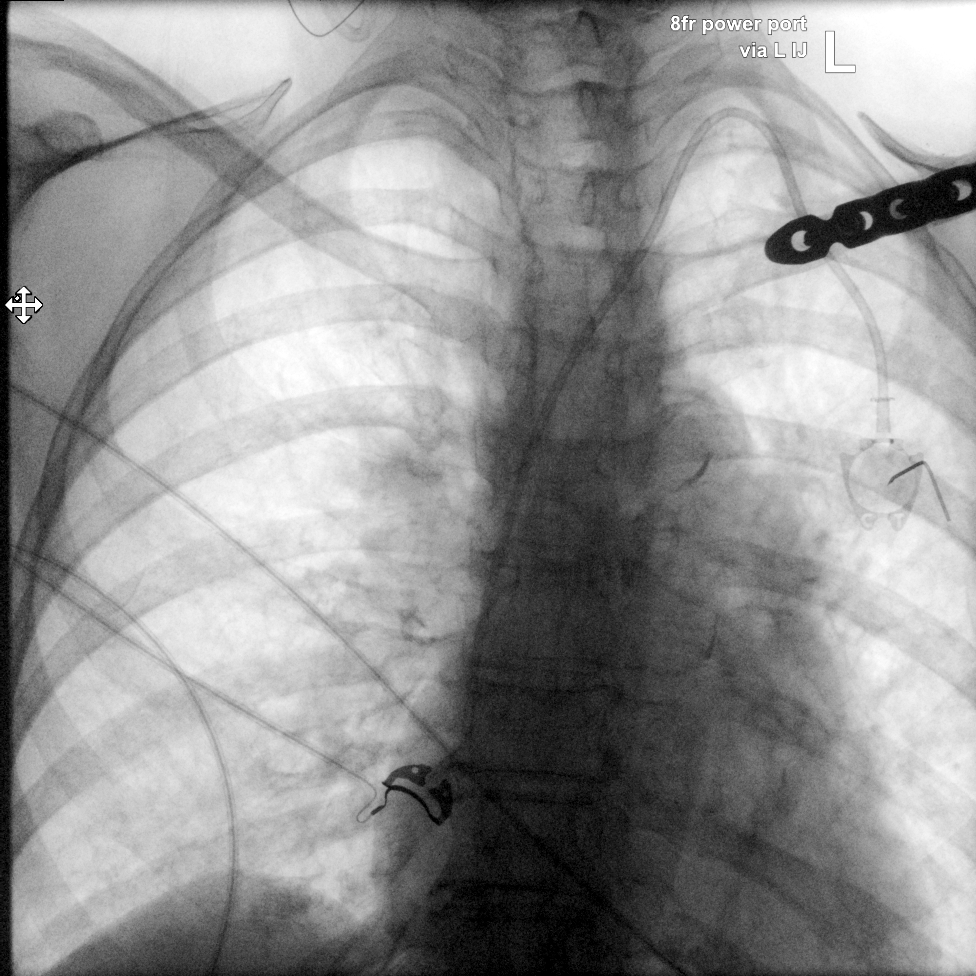

[3 of 3 positions shown; findings below may reference images not displayed]

EXAM:
FLUOROSCOPIC AND ULTRASOUND GUIDED PLACEMENT OF A SUBCUTANEOUS PORT.

MEDICATIONS:
Moderate sedation

ANESTHESIA/SEDATION:
Moderate (conscious) sedation was employed during this procedure. A
total of Versed 1.0mg and fentanyl 25 mcg was administered
intravenously at the order of the provider performing the procedure.

Total intra-service moderate sedation time: 30 minutes.

Patient's level of consciousness and vital signs were monitored
continuously by radiology nurse throughout the procedure under the
supervision of the provider performing the procedure.

FLUOROSCOPY TIME:  1 minute, 2.5 mGy

COMPLICATIONS:
None immediate.

PROCEDURE:
The procedure was explained to the patient. The risks and benefits
of the procedure were discussed and the patient's questions were
addressed. Informed consent was obtained from the patient. Patient
was placed supine on the interventional table. Ultrasound confirmed
a patent left internal jugular vein. Ultrasound image was saved for
documentation. The left chest and neck were cleaned with a skin
antiseptic and a sterile drape was placed. Maximal barrier sterile
technique was utilized including caps, mask, sterile gowns, sterile
gloves, sterile drape, hand hygiene and skin antiseptic. The left
neck was anesthetized with 1% lidocaine. Small incision was made in
the left neck with a blade. Micropuncture set was placed in the left
IJ with ultrasound guidance. The micropuncture wire was used for
measurement purposes. The left chest was anesthetized with 1%
lidocaine with epinephrine. #15 blade was used to make an incision
and a subcutaneous port pocket was formed. 8 french Power Port was
assembled. Subcutaneous tunnel was formed with a stiff tunneling
device. The port catheter was brought through the subcutaneous
tunnel. The port was placed in the subcutaneous pocket. The
micropuncture set was exchanged for a peel-away sheath. The catheter
was placed through the peel-away sheath and the tip was positioned
at the superior cavoatrial junction. Catheter placement was
confirmed with fluoroscopy. The port was accessed and flushed with
heparinized saline. The port pocket was closed using two layers of
absorbable sutures and Dermabond. The vein skin site was closed
using a single layer of absorbable suture and Dermabond. Sterile
dressings were applied. Patient tolerated the procedure well without
an immediate complication. Ultrasound and fluoroscopic images were
taken and saved for this procedure.
IMPRESSION: Placement of a subcutaneous port device. Catheter tip at the
superior cavoatrial junction.

## 2021-02-18 MED ORDER — SODIUM CHLORIDE 0.9 % IV SOLN
INTRAVENOUS | Status: DC
  Filled 2021-02-18: qty 1000

## 2021-02-18 MED ORDER — MIDAZOLAM HCL 2 MG/2ML IJ SOLN
INTRAMUSCULAR | Status: AC | PRN
  Administered 2021-02-18: 1 mg via INTRAVENOUS

## 2021-02-18 MED ORDER — LIDOCAINE HCL 1 % IJ SOLN
INTRAMUSCULAR | Status: AC
  Administered 2021-02-18: 15 mL
  Filled 2021-02-18: qty 20

## 2021-02-18 MED ORDER — MIDAZOLAM HCL 2 MG/2ML IJ SOLN
INTRAMUSCULAR | Status: AC
  Filled 2021-02-18: qty 2

## 2021-02-18 MED ORDER — HEPARIN SOD (PORK) LOCK FLUSH 100 UNIT/ML IV SOLN
INTRAVENOUS | Status: AC
  Administered 2021-02-18: 500 [IU]
  Filled 2021-02-18: qty 5

## 2021-02-18 MED ORDER — FENTANYL CITRATE (PF) 100 MCG/2ML IJ SOLN
INTRAMUSCULAR | Status: AC | PRN
Start: 2021-02-18 — End: 2021-02-18
  Administered 2021-02-18: 25 ug via INTRAVENOUS

## 2021-02-18 MED ORDER — LIDOCAINE-EPINEPHRINE 1 %-1:100000 IJ SOLN
INTRAMUSCULAR | Status: AC
  Administered 2021-02-18: 7 mL
  Filled 2021-02-18: qty 1

## 2021-02-18 MED ORDER — FENTANYL CITRATE (PF) 100 MCG/2ML IJ SOLN
INTRAMUSCULAR | Status: AC
  Filled 2021-02-18: qty 2

## 2021-02-18 NOTE — Procedures (Signed)
Interventional Radiology Procedure:   Indications: Metastatic lung cancer  Procedure: Port placement  Findings: Left jugular port, tip at SVC/RA junction  Complications: None     EBL: Minimal, less than 10 ml  Plan: Discharge in one hour.  Keep port site and incisions dry for at least 24 hours.     Marnesha Gagen R. Anselm Pancoast, MD  Pager: [phone]

## 2021-02-18 NOTE — Progress Notes (Signed)
PA Koreen and Dr. Markus Daft both spoke with pt. And her spouse re: Port insertion procedure. Both verbalize understanding of conversation.

## 2021-02-18 NOTE — H&P (Signed)
Chief Complaint: Patient was seen in consultation today for squamous cell carcinoma at the request of Brahmanday,Govinda R  Referring Physician(s): Cammie Sickle  Supervising Physician: Markus Daft  Patient Status: [facility] - Out-pt  History of Present Illness: Sarah Li is a 80 y.o. 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 The patient follows with Dr. Rogue Bussing and was seen 11/1 with plan for palliative immunotherapy and radiation to T12, RUL nodule and right rib lesion.   Patient here today for image guided port a catheter placement with moderate sedation.   The patient has had a H&P performed within the last 30 days, all history, medications, and exam have been reviewed. The patient denies any interval changes since the H&P.  The patient denies any chest pain or shortness of breath. The patient denies any recent infections, fever or chills. The patient denies any history of sleep apnea or chronic oxygen use. She has no known complications to sedation.    Past Medical History:  Diagnosis Date   Atrial fibrillation ([place])    Breast cancer ([place]) 2002   positive   Carotid bruit    Coronary artery disease    Dental bridge present    lower left - permanent   Depression    GERD (gastroesophageal reflux disease)    Gout    Hyperlipidemia    Mitral valve regurgitation    Personal history of radiation therapy    Raynaud's disease    Wolff-Parkinson-White syndrome     Past Surgical History:  Procedure Laterality Date   ABDOMINAL HYSTERECTOMY     BREAST EXCISIONAL BIOPSY Right 1980   neg bx   BREAST EXCISIONAL BIOPSY Right 2002   breast ca radation   BREAST LUMPECTOMY     CARDIOVERSION N/A 05/10/2019   Procedure: CARDIOVERSION;  Surgeon: Corey Skains, MD;  Location: [facility] ORS;  Service: Cardiovascular;  Laterality: N/A;   [place]  STUDY N/A 12/02/2015   Procedure: CARDIOVERSION;  Surgeon: Dionisio David, MD;  Location: [facility] ORS;  Service: Cardiovascular;  Laterality: N/A;   PTOSIS REPAIR Bilateral 11/29/2017   Procedure: BLEPHAROPTOSIS REPAIR RESECT EX;  Surgeon: Karle Starch, MD;  Location: [place];  Service: Ophthalmology;  Laterality: Bilateral;    Allergies: Crestor [rosuvastatin calcium] and Lipitor [atorvastatin]  Medications: Prior to Admission medications   Medication Sig Start Date End Date Taking? Authorizing Provider  allopurinol (ZYLOPRIM) 300 MG tablet Take 300 mg by mouth daily.   Yes [provider]  apixaban (ELIQUIS) 5 MG TABS tablet Take 1 tablet (5 mg total) by mouth 2 (two) times daily. 02/12/16  Yes Minna Merritts, MD  ezetimibe (ZETIA) 10 MG tablet Take 1 tablet (10 mg total) by mouth daily. 08/16/16 02/18/21 Yes Gollan, Kathlene November, MD  gabapentin (NEURONTIN) 100 MG capsule Take 100 mg by mouth at bedtime. 12/29/20  Yes [provider]  HYDROcodone-acetaminophen (NORCO/VICODIN) 5-325 MG tablet Take 1 tablet by mouth every 8 (eight) hours as needed for moderate pain. 02/10/21  Yes Cammie Sickle, MD  metoprolol succinate (TOPROL-XL) 50 MG 24 hr tablet Take 50 mg by mouth daily. 10/27/20  Yes [provider]  potassium chloride (KLOR-CON) 10 MEQ tablet Take by mouth. 11/21/20  Yes [provider]  predniSONE (DELTASONE) 20 MG tablet Take 1 tablet (20 mg total) by mouth daily with breakfast. 02/10/21  Yes Brahmanday,  Elisha Headland, MD  traZODone (DESYREL) 50 MG tablet Take 50 mg by mouth at bedtime.   Yes [provider]  venlafaxine (EFFEXOR) 75 MG tablet Take 75 mg by mouth daily.    Yes [provider]  Vitamin D, Ergocalciferol, (DRISDOL) 50000 units CAPS capsule Take 50,000 Units by mouth every 7 (seven) days. Friday 01/13/16  Yes [provider]  amiodarone (PACERONE) 200 MG tablet Take 1 tablet (200 mg total) by mouth  daily. Patient not taking: No sig reported 05/10/19   Corey Skains, MD  calcium carbonate (TUMS - DOSED IN MG ELEMENTAL CALCIUM) 500 MG chewable tablet Chew 1 tablet by mouth daily.    [provider]  Carboxymethylcellul-Glycerin (CLEAR EYES FOR DRY EYES) 1-0.25 % SOLN Place 1 drop into both eyes daily as needed (Dry eyes).    [provider]  fluticasone (FLONASE) 50 MCG/ACT nasal spray Place 2 sprays into both nostrils daily as needed for allergies or rhinitis.    [provider]  furosemide (LASIX) 20 MG tablet Take 20 mg by mouth daily as needed for fluid.     [provider]  gemfibrozil (LOPID) 600 MG tablet Take 600 mg by mouth daily.     [provider]  metaxalone (SKELAXIN) 800 MG tablet Take by mouth. Patient not taking: No sig reported 07/04/19   [provider]  traMADol (ULTRAM) 50 MG tablet Take 50 mg by mouth every 6 (six) hours as needed. Patient not taking: No sig reported 12/17/20   [provider]     Family History  Problem Relation Age of Onset   Heart attack Mother    Heart attack Father    Breast cancer Sister    Heart attack Sister    Breast cancer Sister    Heart attack Sister    Heart attack Brother    Heart attack Brother     Social History   Socioeconomic History   Marital status: Married    Spouse name: Stan Head   Number of children: 1   Years of education: Not on file   Highest education level: Not on file  Occupational History   Not on file  Tobacco Use   Smoking status: Former    Packs/day: 1.00    Years: 30.00    Pack years: 30.00    Types: Cigarettes    Quit date: 1991    Years since quitting: 31.8   Smokeless tobacco: Former    Quit date: 12/01/1989  Vaping Use   Vaping Use: Never used  Substance and Sexual Activity   Alcohol use: No   Drug use: No   Sexual activity: Not on file  Other Topics Concern   Not on file  Social History Narrative   Lives at home in private  residence with husband in [place].  Quit smoking in 1991. No alcohol. 1 daughter- in [place].    Social Determinants of Health   Financial Resource Strain: Not on file  Food Insecurity: Not on file  Transportation Needs: Not on file  Physical Activity: Not on file  Stress: Not on file  Social Connections: Not on file   Review of Systems: A 12 point ROS discussed and pertinent positives are indicated in the HPI above.  All other systems are negative.  Review of Systems  Vital Signs: BP 113/86   Pulse 93   Temp 97.8 F (36.6 C) (Oral)   Resp 20   Ht 5\' 2"  (1.575 m)  Wt 100 lb (45.4 kg)   SpO2 97%   BMI 18.29 kg/m   Physical Exam Constitutional:      Appearance: Normal appearance.  HENT:     Head: Normocephalic and atraumatic.  Cardiovascular:     Rate and Rhythm: Normal rate.  Pulmonary:     Effort: Pulmonary effort is normal. No respiratory distress.     Breath sounds: Normal breath sounds.  Skin:    General: Skin is warm and dry.  Neurological:     Mental Status: She is alert and oriented to person, place, and time.    Imaging: NM PET Image Initial (PI) Skull Base To Thigh (F-18 FDG)  Result Date: 01/24/2021 CLINICAL DATA:  Initial treatment strategy for osseous metastasis from unknown primary. Recent biopsy of T12 vertebral body. History of breast cancer. EXAM: NUCLEAR MEDICINE PET SKULL BASE TO THIGH TECHNIQUE: 5.8 mCi F-18 FDG was injected intravenously. Full-ring PET imaging was performed from the skull base to thigh after the radiotracer. CT data was obtained and used for attenuation correction and anatomic localization. Fasting blood glucose: 86 mg/dl COMPARISON:  11/21/2020 abdominopelvic CT. FINDINGS: Mediastinal blood pool activity: SUV max 1.8 Liver activity: SUV max NA NECK: No areas of abnormal hypermetabolism. Incidental CT findings: Bilateral carotid atherosclerosis. No cervical adenopathy. CHEST: No thoracic nodal hypermetabolism. Right upper lobe  nodule and adjacent interstitial thickening are hypermetabolic. Example nodule at up to 1.8 cm and a S.U.V. max of 4.6 on 85/3. Mild hypermetabolism corresponding to anterior right pleural thickening on 93/3 at a S.U.V. max of 1.7. Incidental CT findings: Trace right pleural fluid. Advanced bullous emphysema. Biapical pleuroparenchymal scarring. ABDOMEN/PELVIS: No abdominopelvic parenchymal or nodal hypermetabolism. Incidental CT findings: Normal adrenal glands. Too small to characterize left renal lesions with a lower pole left renal 3.3 cm cyst. Abdominal aortic atherosclerosis. Hysterectomy. Large colonic stool burden. SKELETON: The T12 lesion detailed on prior CT and MRI is hypermetabolic, including at a S.U.V. max of 7.6 on 135/3. Anterior right seventh rib hypermetabolism corresponds to subtle sclerosis including at a S.U.V. max of 4.3 on 127/3. Incidental CT findings: Mild osteopenia.  Left clavicular fixation. IMPRESSION: 1. Right upper lobe hypermetabolic pulmonary nodule and adjacent interstitial thickening, most consistent with primary bronchogenic carcinoma. 2. T12 and right seventh rib osseous metastasis. 3. Trace right pleural fluid. 4. Incidental findings, including: Aortic atherosclerosis (ICD10-I70.0), coronary artery atherosclerosis and emphysema (ICD10-J43.9). Electronically Signed   By: Abigail Miyamoto M.D.   On: 01/24/2021 10:00    Labs:  CBC: Recent Labs    01/09/21 0834 01/20/21 1553 02/10/21 1329  WBC 7.3 6.0 6.5  HGB 12.0 11.9* 12.2  HCT 35.8* 36.7 37.1  PLT 293 302 262    COAGS: Recent Labs    01/09/21 0834  INR 1.0    BMP: Recent Labs    01/20/21 1553 02/10/21 1329  NA 137 139  K 3.8 3.8  CL 100 98  CO2 26 28  GLUCOSE 77 105*  BUN 18 14  CALCIUM 9.4 9.2  CREATININE 0.87 0.87  GFRNONAA >60 >60    LIVER FUNCTION TESTS: Recent Labs    01/20/21 1553 02/10/21 1329  BILITOT 0.2* 0.5  AST 20 22  ALT 8 12  ALKPHOS 92 90  PROT 7.8 7.0  ALBUMIN 4.0 3.7     Assessment and Plan: 80 year old female with 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 The patient follows with Dr. Rogue Bussing  and was seen 11/1 with plan for palliative immunotherapy and radiation to T12, RUL nodule and right rib lesion.   Patient here today for image guided port a catheter placement with moderate sedation.   The patient has been NPO, labs and vitals have been reviewed.  Risks and benefits of image guided port-a-catheter placement was discussed with the patient including, but not limited to bleeding, infection, pneumothorax, or fibrin sheath development and need for additional procedures.  All of the patient's questions were answered, patient is agreeable to proceed. Consent signed and in chart.   Thank you for this interesting consult.  I greatly enjoyed meeting CATHERIN DOORN and look forward to participating in their care.  A copy of this report was sent to the requesting provider on this date.  Electronically Signed: Hedy Jacob, PA-C 02/18/2021, 9:23 AM   I spent a total of 5 Minutes in face to face in clinical consultation, greater than 50% of which was counseling/coordinating care for immunotherapy for squamous cell carcinoma.

## 2021-02-19 ENCOUNTER — Ambulatory Visit
Admission: RE | Admit: 2021-02-19 | Discharge: 2021-02-19 | Disposition: A | Discharge status: Home / Self Care | Payer: Medicare Other | Source: Ambulatory Visit | Attending: Radiation Oncology | Admitting: Radiation Oncology

## 2021-02-19 DIAGNOSIS — Z510 Encounter for antineoplastic radiation therapy: Secondary | ICD-10-CM | POA: Diagnosis not present

## 2021-02-19 DIAGNOSIS — C3411 Malignant neoplasm of upper lobe, right bronchus or lung: Secondary | ICD-10-CM | POA: Insufficient documentation

## 2021-02-19 NOTE — Assessment & Plan Note (Signed)
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 PET OCT 2022-  Right upper lobe hypermetabolic pulmonary nodule and adjacent interstitial thickening, most consistent with primary bronchogeniccarcinoma. T12 and right seventh rib osseous metastasis.  # Currently palliative radiation to the T12 lesion [11/04]; awaiting  RT  right upper lobe nodule-start next week [until 11/21].  #Patient borderline candidate for chemoimmunotherapy.  TPS 1-candidate for [facility] immunotherapy.  We will start patient on Keytruda after finishing radiation to the lung.  I discussed the mechanism of action; The goal of therapy is palliative; and length of treatments are likely ongoing/based upon the results of the scans. Discussed the potential side effects of immunotherapy including but not limited to diarrhea; skin rash; elevated LFTs/endocrine abnormalities etc.  # Back pain: Secondary to underlying malignancy ; continue hydrocodone 1 pill every 12 hours as needed nightly. Discuss Zometa.   .# Palliative care evaluation: Introduced palliative care philosophy and services. I discussed the need for palliative care evaluation/symptom management to help quality of life in the context of incurable disease.   #IV access discussed: Recommend port placement.   # DISPOSITION: # referral to IR for port placement # referral to Josh this week/coordinate with Rt appt re: palliative care # follow up 11/28- MD; labs- cbc/cmp;Keytruda-Dr.B

## 2021-02-20 ENCOUNTER — Ambulatory Visit: Payer: Medicare Other

## 2021-02-20 ENCOUNTER — Ambulatory Visit
Admission: RE | Admit: 2021-02-20 | Discharge: 2021-02-20 | Disposition: A | Discharge status: Home / Self Care | Payer: Medicare Other | Source: Ambulatory Visit | Attending: Radiation Oncology | Admitting: Radiation Oncology

## 2021-02-20 DIAGNOSIS — Z510 Encounter for antineoplastic radiation therapy: Secondary | ICD-10-CM | POA: Diagnosis not present

## 2021-02-23 ENCOUNTER — Ambulatory Visit
Admission: RE | Admit: 2021-02-23 | Discharge: 2021-02-23 | Disposition: A | Discharge status: Home / Self Care | Payer: Medicare Other | Source: Ambulatory Visit | Attending: Radiation Oncology | Admitting: Radiation Oncology

## 2021-02-23 ENCOUNTER — Ambulatory Visit: Payer: Medicare Other

## 2021-02-23 DIAGNOSIS — R5383 Other fatigue: Secondary | ICD-10-CM

## 2021-02-23 DIAGNOSIS — Z510 Encounter for antineoplastic radiation therapy: Secondary | ICD-10-CM | POA: Diagnosis not present

## 2021-02-23 NOTE — Research (Signed)
Trial:  [facility] /  [facility] Pilot Chair Exercise Protocol:  Patient Sarah Li was identified by Jeral Fruit, RN as a potential candidate for the above listed study.  This Clinical Research Nurse spoke with Sarah Li, [id number], on 02/23/21 via a telephone secure line that ensures patient privacy to discuss participation in the above listed research study. Patient reads, speaks, and understands [place].  Approximately 15 minutes were spent talking with the patient reviewing the informed consent documents.  Research nurse explained the protocol briefly and answered questions the patient had to their reported satisfaction. The patient was informed that her participation is strictly voluntary and would not change any care she receives here at the cancer center if she decided she did not want to participate. The patient is agreeable to meet with the research nurse 02/24/2021 Tuesday after her chemotherapy education class to review the informed consent documents in detail.  Jeral Fruit, RN 02/23/21 1:03 PM

## 2021-02-24 ENCOUNTER — Other Ambulatory Visit: Payer: Self-pay

## 2021-02-24 ENCOUNTER — Ambulatory Visit
Admission: RE | Admit: 2021-02-24 | Discharge: 2021-02-24 | Disposition: A | Discharge status: Home / Self Care | Payer: Medicare Other | Source: Ambulatory Visit | Attending: Radiation Oncology | Admitting: Radiation Oncology

## 2021-02-24 ENCOUNTER — Inpatient Hospital Stay: Payer: Medicare Other

## 2021-02-24 DIAGNOSIS — Z510 Encounter for antineoplastic radiation therapy: Secondary | ICD-10-CM | POA: Diagnosis not present

## 2021-02-24 DIAGNOSIS — R5383 Other fatigue: Secondary | ICD-10-CM

## 2021-02-24 NOTE — Research (Addendum)
Trial:  [facility] / SCOR Chair Home Based Exercise Protocol:  Patient Sarah Li was identified by Jordan Likes as a potential candidate for the above listed study.  This Clinical Research Nurse met with Sarah Li, [id number], on 02/24/21 in a manner and location that ensures patient privacy to discuss participation in the above listed research study.  Patient is Unaccompanied.  A copy of the informed consent document and separate HIPAA Authorization was provided to the patient.  Patient reads, speaks, and understands [place].   Patient was provided with the business card of this Nurse and encouraged to contact the research team with any questions.  Approximately 15 minutes were spent with the patient reviewing the informed consent documents.  Patient was provided the option of taking informed consent documents home to review and was encouraged to review at their convenience with their support network, including other care providers. Patient took the consent documents home to review. The patient states she feels a little overwhelmed with everything currently. She is concerned about coping through the Thanksgiving holiday with her treatment as well. The patient was assured that there isn't a hurry for her to make any decision, she would just need to be registered to the protocol before she starts her first chemotherapy. She states she doesn't live but a little ways from here, so that isn't an issue. Research nurse encouraged the patient to take her time in deciding if she wanted to participate, reviewed the potential benefits and risks, and informed of this being a voluntary decision. The patient was reassured that she did not have to participate at all, and her care would remain the same without any changes. After review of the benefits, she was excited and stated she wanted to find exercises that would be easier for her to do with her back condition. Patient states she hasn't been exercising at all  within the past 6 months due to her pain and the fatigue related to her cancer. Research encouraged the patient to call the research department when she makes her decision, all appointments will be made around her regularly scheduled visits here in the cancer center to avoid unnecessary visits. The patients questions were answered to her voiced satisfaction. Sarah Fruit, RN 02/24/21 11:33 AM

## 2021-02-25 ENCOUNTER — Ambulatory Visit
Admission: RE | Admit: 2021-02-25 | Discharge: 2021-02-25 | Disposition: A | Discharge status: Home / Self Care | Payer: Medicare Other | Source: Ambulatory Visit | Attending: Radiation Oncology | Admitting: Radiation Oncology

## 2021-02-25 ENCOUNTER — Ambulatory Visit: Payer: Medicare Other

## 2021-02-25 DIAGNOSIS — Z510 Encounter for antineoplastic radiation therapy: Secondary | ICD-10-CM | POA: Diagnosis not present

## 2021-02-26 ENCOUNTER — Telehealth: Payer: Self-pay

## 2021-02-26 ENCOUNTER — Inpatient Hospital Stay (HOSPITAL_BASED_OUTPATIENT_CLINIC_OR_DEPARTMENT_OTHER): Payer: Medicare Other | Admitting: Hospice and Palliative Medicine

## 2021-02-26 ENCOUNTER — Ambulatory Visit: Payer: Medicare Other

## 2021-02-26 ENCOUNTER — Other Ambulatory Visit: Payer: Self-pay

## 2021-02-26 DIAGNOSIS — G893 Neoplasm related pain (acute) (chronic): Secondary | ICD-10-CM

## 2021-02-26 MED ORDER — HYDROCODONE-ACETAMINOPHEN 5-325 MG PO TABS: 1.0000 | ORAL_TABLET | ORAL | 0 refills | Status: DC | PRN

## 2021-02-26 NOTE — Telephone Encounter (Signed)
Informed by radiation patient is having abdominal pain and MD requesting [facility]. RN called patient who is saying she don't think she can come today or tomorrow, she is hurting so bad and barley able to walk and struggling to get up and take a shower. She is refusing scans or anything, saying she already has one scheduled. She says she may be constipated. Encouraged fluid intake. She asks for a virtual/telephone visit instead this am. Scheduled at 1030, patient and NP aware.

## 2021-02-26 NOTE — Telephone Encounter (Signed)
Patient called to cancel apt with Radiation this morning due to pain. Patient is experiencing pain from bra line down to hip line. Patient is stating the pain is so severe that she cannot get out of bed. Per Dr. Baruch Gouty we are canceling all Tx's this week and referring to [facility]. Sent message to get patient in tomorrow.   Called Patient back and told information and she understood and felt better about the situation.

## 2021-02-26 NOTE — Progress Notes (Signed)
Virtual Visit via Telephone Note  I connected with Sarah Li on 02/26/21 at 10:30 AM EST by telephone and verified that I am speaking with the correct person using two identifiers.  Location: Patient: Home Provider: Clinic   I discussed the limitations, risks, security and privacy concerns of performing an evaluation and management service by telephone and the availability of in person appointments. I also discussed with the patient that there may be a patient responsible charge related to this service. The patient expressed understanding and agreed to proceed.   History of Present Illness: Sarah Li is a 80 y.o. 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  Patient has T12 and right seventh rib osseous metastasis on XRT.  Plan is to initiate chemotherapy/immunotherapy.  She has had back pain and was referred to palliative care to help address goals and manage ongoing symptoms.   Observations/Objective: Patient requested a telephone visit today due to worsening back pain.  Patient reports that she has good days and bad days but has had a lot of pain yesterday and today, which radiates from back around to stomach.  She did not come for XRT today.  Patient reports that she is taking the Norco 2-3 times daily, which helps but is short-lived.  Patient also endorses constipation and has not had a bowel movement in several days.  She took milk of mag this morning.  Patient reports that she is tolerating Norco well and denies any adverse effects.  Assessment and Plan: Neoplasm related pain -suggested liberalization of frequency for Norco to every 4 hours as needed.  Patient says that she is not interested in any imaging at the present time.  She would like to wait for her PET scan 11/23.  Discussed constipation management in detail.  Patient will call us back if pain remains poorly controlled after increasing the Norco.  Follow Up  Instructions: Follow-up telephone visit next week   I discussed the assessment and treatment plan with the patient. The patient was provided an opportunity to ask questions and all were answered. The patient agreed with the plan and demonstrated an understanding of the instructions.   The patient was advised to call back or seek an in-person evaluation if the symptoms worsen or if the condition fails to improve as anticipated.  I provided 5 minutes of non-face-to-face time during this encounter.   Irean Hong, NP

## 2021-02-27 ENCOUNTER — Telehealth: Payer: Medicare Other | Admitting: Hospice and Palliative Medicine

## 2021-02-27 ENCOUNTER — Telehealth: Payer: Self-pay

## 2021-02-27 ENCOUNTER — Telehealth: Payer: Self-pay | Admitting: Hospice and Palliative Medicine

## 2021-02-27 ENCOUNTER — Ambulatory Visit: Payer: Medicare Other

## 2021-02-27 MED ORDER — DEXAMETHASONE 2 MG PO TABS: 2.0000 mg | ORAL_TABLET | Freq: Two times a day (BID) | ORAL | 0 refills | Status: AC

## 2021-02-27 MED ORDER — OXYCODONE HCL 10 MG PO TABS: 10.0000 mg | ORAL_TABLET | ORAL | 0 refills | Status: DC | PRN

## 2021-02-27 MED ORDER — HYDROCODONE-ACETAMINOPHEN 5-325 MG PO TABS: 1.0000 | ORAL_TABLET | ORAL | 0 refills | Status: DC | PRN

## 2021-02-27 NOTE — Telephone Encounter (Signed)
I spoke again with patient by phone.  She continues to endorse intractable low back pain unrelieved even after liberalized dosing of Norco.  She denies any adverse effects from pain medications.  No drowsiness or confusion.  She requests "something stronger for pain."  I again recommended clinic or ER evaluation for imaging.  Patient again refused.  She says that she just wants to get through the weekend and wants her PET scan next week.  Case discussed with Dr. Rogue Bussing.  Will rotate from Norco to oxycodone 10 mg every 4 hours as needed #45.  Will rotate from prednisone to dexamethasone 2 mg twice daily.

## 2021-02-27 NOTE — Telephone Encounter (Signed)
Spoke with patient by phone.  She continues to endorse severe and persistent back pain, which radiates to her thighs/abdomen.  She had a bowel movement yesterday without significant improvement in her symptoms.  Pain seems originate in her back at known site of metastatic disease.  I again discussed imaging to rule out pathologic fracture but she declined this.  She would like to wait for her PET scan next week.  Yesterday, I liberalized her Norco to every 4 hours PRN.  Patient reports she has been taking it every 4 hours around-the-clock.  She denies adverse effects but cannot tell that it is helped much.  Additionally, she is still on prednisone daily.  Suggested that she again liberalize the dose of Norco to 2 tablets every 4 hours.  Could consider starting her on a long-acting opioid if needed.  Discussed clinic eval or ER triggers but patient really does not want to come to the clinic for hospital.

## 2021-02-27 NOTE — Telephone Encounter (Signed)
Mrs. Gockley called this morning stating that she was still in pain from yesterday and asked if Merrily Pew could give her a call to find something else to help her. Mrs.Thurmond stated that she has increased her pain medication to every 4 hours and no change in the level of pain.  Reached out to [employer] for advice.

## 2021-03-02 ENCOUNTER — Ambulatory Visit: Payer: Medicare Other

## 2021-03-02 ENCOUNTER — Other Ambulatory Visit: Payer: Self-pay

## 2021-03-02 ENCOUNTER — Ambulatory Visit
Admission: RE | Admit: 2021-03-02 | Discharge: 2021-03-02 | Disposition: A | Discharge status: Home / Self Care | Payer: Medicare Other | Source: Ambulatory Visit | Attending: Radiation Oncology | Admitting: Radiation Oncology

## 2021-03-02 DIAGNOSIS — Z510 Encounter for antineoplastic radiation therapy: Secondary | ICD-10-CM | POA: Diagnosis not present

## 2021-03-02 MED ORDER — ONDANSETRON HCL 8 MG PO TABS: 8.0000 mg | ORAL_TABLET | Freq: Three times a day (TID) | ORAL | 1 refills | Status: DC | PRN

## 2021-03-02 MED ORDER — ONDANSETRON HCL 8 MG PO TABS: 8.0000 mg | ORAL_TABLET | Freq: Two times a day (BID) | ORAL | 1 refills | Status: AC | PRN

## 2021-03-02 MED ORDER — PROCHLORPERAZINE MALEATE 10 MG PO TABS: 10.0000 mg | ORAL_TABLET | Freq: Four times a day (QID) | ORAL | 1 refills | Status: AC | PRN

## 2021-03-02 MED ORDER — LIDOCAINE-PRILOCAINE 2.5-2.5 % EX CREA: 1.0000 "application " | TOPICAL_CREAM | CUTANEOUS | 0 refills | Status: AC | PRN

## 2021-03-03 ENCOUNTER — Telehealth: Payer: Medicare Other | Admitting: Hospice and Palliative Medicine

## 2021-03-03 ENCOUNTER — Ambulatory Visit
Admission: RE | Admit: 2021-03-03 | Discharge: 2021-03-03 | Disposition: A | Discharge status: Home / Self Care | Payer: Medicare Other | Source: Ambulatory Visit | Attending: Radiation Oncology | Admitting: Radiation Oncology

## 2021-03-03 ENCOUNTER — Inpatient Hospital Stay: Payer: Medicare Other

## 2021-03-03 DIAGNOSIS — Z510 Encounter for antineoplastic radiation therapy: Secondary | ICD-10-CM | POA: Diagnosis not present

## 2021-03-03 NOTE — Progress Notes (Signed)
Nutrition Assessment   Reason for Assessment:  Referral from [place], NP for weight loss and poor appetite.   ASSESSMENT:  80 year old female with stage IV SCC lung cancer with bone mets.  Past medical history of afib, CAD, depression, breast cancer, HLD, GERD. Patient will complete palliative radiation on 11/29.  Patient to start keytruda on 11/28.    Met with patient following radiation today.  Patient reports decreased appetite and taste alterations.  Typically has cereal and fruit for breakfast.  Lunch is usually cheese and crackers or peanut butter and crackers.  Supper is mostly vegetables (including beans).  Patient says meats do not taste good except able to eat fish and some chicken.  Eats jello with fruit and ice cream mixed with whole milk for hs snack.    Medications: dexamethasone, lasix, zofran, KCL, compazine, Vit D   Labs: reviewed   Anthropometrics:   Weight 97 lb today per patient  100lb on 11/9 103 lb on 10/13 102 lb on 9/30  5% weight loss in the last month, significant  NUTRITION DIAGNOSIS:  Inadequate oral intake related to cancer related treatment side effects as evidenced by 5% weight loss in the last month and poor po intake   INTERVENTION:  Discussed ways to add calories and protein to current diet. Handout provided Encouraged adding protein food at every meal Discussed oral nutrition supplements. Samples given and coupons.  Encouraged 350 calorie shake or higher.  Recipe booklet given. Patient wants to try different shakes.  Contact information given    MONITORING, EVALUATION, GOAL: weight trends, intake   Next Visit: phone call Dec 20   Rashelle Ireland B. Zenia Resides, [place], [place] Registered Dietitian [phone] (mobile)

## 2021-03-04 ENCOUNTER — Inpatient Hospital Stay (HOSPITAL_BASED_OUTPATIENT_CLINIC_OR_DEPARTMENT_OTHER): Payer: Medicare Other | Admitting: Hospice and Palliative Medicine

## 2021-03-04 ENCOUNTER — Other Ambulatory Visit: Payer: Self-pay

## 2021-03-04 ENCOUNTER — Ambulatory Visit
Admission: RE | Admit: 2021-03-04 | Discharge: 2021-03-04 | Disposition: A | Discharge status: Home / Self Care | Payer: Medicare Other | Source: Ambulatory Visit | Attending: Radiation Oncology | Admitting: Radiation Oncology

## 2021-03-04 ENCOUNTER — Ambulatory Visit: Payer: Medicare Other

## 2021-03-04 ENCOUNTER — Ambulatory Visit
Admission: RE | Admit: 2021-03-04 | Discharge: 2021-03-04 | Disposition: A | Discharge status: Home / Self Care | Payer: Medicare Other | Source: Ambulatory Visit | Attending: Internal Medicine | Admitting: Internal Medicine

## 2021-03-04 DIAGNOSIS — C7951 Secondary malignant neoplasm of bone: Secondary | ICD-10-CM | POA: Insufficient documentation

## 2021-03-04 DIAGNOSIS — I700 Atherosclerosis of aorta: Secondary | ICD-10-CM | POA: Diagnosis not present

## 2021-03-04 DIAGNOSIS — J439 Emphysema, unspecified: Secondary | ICD-10-CM | POA: Diagnosis not present

## 2021-03-04 DIAGNOSIS — G893 Neoplasm related pain (acute) (chronic): Secondary | ICD-10-CM

## 2021-03-04 DIAGNOSIS — C801 Malignant (primary) neoplasm, unspecified: Secondary | ICD-10-CM | POA: Diagnosis not present

## 2021-03-04 DIAGNOSIS — Z510 Encounter for antineoplastic radiation therapy: Secondary | ICD-10-CM | POA: Diagnosis not present

## 2021-03-04 DIAGNOSIS — Z515 Encounter for palliative care: Secondary | ICD-10-CM | POA: Diagnosis not present

## 2021-03-04 LAB — GLUCOSE, CAPILLARY: Glucose-Capillary: 84 mg/dL (ref 70–99)

## 2021-03-04 IMAGING — CT NM PET TUM IMG INITIAL (PI) SKULL BASE T - THIGH
1 of 10 series · 1 of 25 positions shown · non-contrast
Comparison: PET-CT [DATE]

CLINICAL DATA: Subsequent treatment strategy for metastatic cancer
of unknown primary. Radiation and steroid therapy. Planned Keytruda
therapy.

EXAM:
NUCLEAR MEDICINE PET SKULL BASE TO THIGH
TECHNIQUE: 5.57 mCi F-18 FDG was injected intravenously. Full-ring PET imaging
was performed from the skull base to thigh after the radiotracer. CT
data was obtained and used for attenuation correction and anatomic
localization.
Fasting blood glucose: 84 mg/dl

[Series 3: ct wb 5.0 b30f · axial · 5.0mm · 0.98mm/px · 1 of 251 slices shown]
[im 251/251  brain]
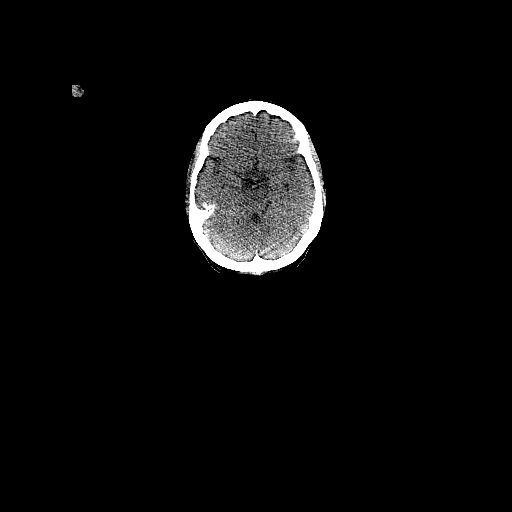

[1 of 25 positions shown; findings below may reference images not displayed]

FINDINGS: Mediastinal blood pool activity: SUV max

Liver activity: SUV max

NECK:

No hypermetabolic cervical lymph nodes are identified.There are no
lesions of the pharyngeal mucosal space.

Incidental CT findings: Bilateral carotid atherosclerosis.

CHEST:

There are no hypermetabolic mediastinal, hilar or axillary lymph
nodes. Anterior right upper lobe hypermetabolic nodularity is again
noted with improvement in the degree of hypermetabolic activity
compared with the previous study. For example, a 2.0 x 1.7 cm
nodular component on image 75/3 currently has an SUV max of 2.6;
previously 4.6. No new pulmonary hypermetabolic activity or
suspicious nodularity. A right pleural effusion has enlarged,
without hypermetabolic activity.

Incidental CT findings: Moderate to severe centrilobular and
paraseptal emphysema with stable biapical scarring. Left IJ
Port-A-Cath extends into the superior aspect of the right atrium.
There is atherosclerosis of the aorta, great vessels and coronary
arteries.

ABDOMEN/PELVIS:

There is no hypermetabolic activity within the liver, adrenal
glands, spleen or pancreas. There is no hypermetabolic nodal
activity.

Incidental CT findings: Stable left renal lesions without
hypermetabolic activity, likely cysts. Moderate stool throughout the
colon. Diffuse aortic and branch vessel atherosclerosis.

SKELETON:

Hypermetabolic, destructive mass involving the T12 vertebral body
again noted. This extends into the posterior elements, asymmetric to
the right, and is associated with slightly greater pathologic
fracture. SUV max is currently 6.4, previously 7.6. Slightly
progressive destruction of the right 7th rib anteriorly (SUV max
3.5). No definite new lesions seen.

Incidental CT findings: Previous left clavicular ORIF. Stable
intramuscular lipoma posterior to the left shoulder.
IMPRESSION: 1. Mildly improved hypermetabolic pulmonary nodularity within the
right upper lobe, consistent with response to therapy.
2. Known metastases involving the T12 vertebral body and right 7th
rib are slightly progressive, although the metabolic activity within
the T12 lesion has slightly improved. No new osseous metastases
identified.
3. No other evidence of metastatic disease.
4. Enlarging moderate-sized right pleural effusion.
5. Aortic Atherosclerosis ([DQ]-[DQ]) and Emphysema ([DQ]-[DQ]).

## 2021-03-04 MED ORDER — OXYCODONE HCL 10 MG PO TABS: 10.0000 mg | ORAL_TABLET | ORAL | 0 refills | Status: DC | PRN

## 2021-03-04 MED ORDER — OXYCODONE HCL ER 20 MG PO T12A: 20.0000 mg | EXTENDED_RELEASE_TABLET | Freq: Two times a day (BID) | ORAL | 0 refills | Status: DC

## 2021-03-04 MED ORDER — FLUDEOXYGLUCOSE F - 18 (FDG) INJECTION
5.2000 | Freq: Once | INTRAVENOUS | Status: AC | PRN
  Administered 2021-03-04: 5.57 via INTRAVENOUS

## 2021-03-04 MED ORDER — NALOXONE HCL 4 MG/0.1ML NA LIQD: NASAL | 0 refills | Status: AC

## 2021-03-04 NOTE — Progress Notes (Signed)
Virtual Visit via Telephone Note  I connected with Sarah Li on 03/04/21 at  1:35 PM EST by telephone and verified that I am speaking with the correct person using two identifiers.  Location: Patient: Home Provider: Clinic   I discussed the limitations, risks, security and privacy concerns of performing an evaluation and management service by telephone and the availability of in person appointments. I also discussed with the patient that there may be a patient responsible charge related to this service. The patient expressed understanding and agreed to proceed.   History of Present Illness: Sarah Li is a 80 y.o. 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  Patient has T12 and right seventh rib osseous metastasis on XRT.  Plan is to initiate chemotherapy/immunotherapy.  She has had back pain and was referred to palliative care to help address goals and manage ongoing symptoms.   Observations/Objective: Spoke with patient by phone.  She reports persistent and severe lower back pain.  She increased her oxycodone to two tablets every 4 hours and finds that to have helped.  She has been a little more functional around the house but feels she might have "overdid it some."  Patient had PET scan earlier today but this has not yet been interpreted.  Discussed pain management in detail.  Patient denies any adverse effects from pain medications.  Patient would be interested in something longer acting to try to lessen the frequency with which she is requiring oxycodone IR.  We will start her on OxyContin.  We will also refill oxycodone to have available if needed for breakthrough pain.  Assessment and Plan: Neoplasm related pain -start OxyContin 20 mg every 12 hours #30. Refill oxycodone IR 80m (1-2 tablets) every 4 hours as needed for BTP #90. Naloxone kit. Continue daily bowel regimen with MiraLAX/senna.   Follow Up  Instructions: Follow-up telephone visit 1-2 weeks   I discussed the assessment and treatment plan with the patient. The patient was provided an opportunity to ask questions and all were answered. The patient agreed with the plan and demonstrated an understanding of the instructions.   The patient was advised to call back or seek an in-person evaluation if the symptoms worsen or if the condition fails to improve as anticipated.  I provided 10 minutes of non-face-to-face time during this encounter.   JIrean Hong NP

## 2021-03-09 ENCOUNTER — Other Ambulatory Visit: Payer: Self-pay

## 2021-03-09 ENCOUNTER — Ambulatory Visit: Admission: RE | Admit: 2021-03-09 | Payer: Medicare Other | Source: Ambulatory Visit

## 2021-03-09 ENCOUNTER — Ambulatory Visit: Payer: Medicare Other

## 2021-03-09 ENCOUNTER — Inpatient Hospital Stay: Payer: Medicare Other | Admitting: Hospice and Palliative Medicine

## 2021-03-09 ENCOUNTER — Encounter: Payer: Self-pay | Admitting: Internal Medicine

## 2021-03-09 ENCOUNTER — Inpatient Hospital Stay (HOSPITAL_BASED_OUTPATIENT_CLINIC_OR_DEPARTMENT_OTHER): Payer: Medicare Other | Admitting: Internal Medicine

## 2021-03-09 ENCOUNTER — Inpatient Hospital Stay: Payer: Medicare Other

## 2021-03-09 DIAGNOSIS — C3411 Malignant neoplasm of upper lobe, right bronchus or lung: Secondary | ICD-10-CM

## 2021-03-09 DIAGNOSIS — Z510 Encounter for antineoplastic radiation therapy: Secondary | ICD-10-CM | POA: Diagnosis not present

## 2021-03-09 DIAGNOSIS — C7951 Secondary malignant neoplasm of bone: Secondary | ICD-10-CM

## 2021-03-09 LAB — CBC WITH DIFFERENTIAL/PLATELET
Abs Immature Granulocytes: 0.11 10*3/uL — ABNORMAL HIGH (ref 0.00–0.07)
Basophils Absolute: 0 10*3/uL (ref 0.0–0.1)
Basophils Relative: 0 %
Eosinophils Absolute: 0 10*3/uL (ref 0.0–0.5)
Eosinophils Relative: 0 %
HCT: 38.7 % (ref 36.0–46.0)
Hemoglobin: 12.8 g/dL (ref 12.0–15.0)
Immature Granulocytes: 1 %
Lymphocytes Relative: 2 %
Lymphs Abs: 0.2 10*3/uL — ABNORMAL LOW (ref 0.7–4.0)
MCH: 29.6 pg (ref 26.0–34.0)
MCHC: 33.1 g/dL (ref 30.0–36.0)
MCV: 89.6 fL (ref 80.0–100.0)
Monocytes Absolute: 0.6 10*3/uL (ref 0.1–1.0)
Monocytes Relative: 4 %
Neutro Abs: 11.9 10*3/uL — ABNORMAL HIGH (ref 1.7–7.7)
Neutrophils Relative %: 93 %
Platelets: 255 10*3/uL (ref 150–400)
RBC: 4.32 MIL/uL (ref 3.87–5.11)
RDW: 14.2 % (ref 11.5–15.5)
WBC: 12.8 10*3/uL — ABNORMAL HIGH (ref 4.0–10.5)
nRBC: 0 % (ref 0.0–0.2)

## 2021-03-09 LAB — COMPREHENSIVE METABOLIC PANEL
ALT: 22 U/L (ref 0–44)
AST: 21 U/L (ref 15–41)
Albumin: 3.6 g/dL (ref 3.5–5.0)
Alkaline Phosphatase: 77 U/L (ref 38–126)
Anion gap: 10 (ref 5–15)
BUN: 16 mg/dL (ref 8–23)
CO2: 28 mmol/L (ref 22–32)
Calcium: 8.4 mg/dL — ABNORMAL LOW (ref 8.9–10.3)
Chloride: 97 mmol/L — ABNORMAL LOW (ref 98–111)
Creatinine, Ser: 0.91 mg/dL (ref 0.44–1.00)
GFR, Estimated: >60 mL/min (ref >60)
Glucose, Bld: 134 mg/dL — ABNORMAL HIGH (ref 70–99)
Potassium: 3.2 mmol/L — ABNORMAL LOW (ref 3.5–5.1)
Sodium: 135 mmol/L (ref 135–145)
Total Bilirubin: 1 mg/dL (ref 0.3–1.2)
Total Protein: 6.7 g/dL (ref 6.5–8.1)

## 2021-03-09 MED ORDER — HEPARIN SOD (PORK) LOCK FLUSH 100 UNIT/ML IV SOLN
500.0000 [IU] | Freq: Once | INTRAVENOUS | Status: AC
  Administered 2021-03-09: 11:00:00 500 [IU] via INTRAVENOUS
  Filled 2021-03-09: qty 5

## 2021-03-09 NOTE — Progress Notes (Signed)
[place] OFFICE PROGRESS NOTE  Patient Care Team: Idelle Crouch, MD as PCP - General (Internal Medicine) Minna Merritts, MD as Consulting Physician (Cardiology)   Cancer Staging  Cancer, metastatic to bone [facility]) Staging form: Bone - Appendicular Skeleton, Trunk, Skull, and Facial Bones, AJCC 8th Edition - Clinical: No stage assigned - Unsigned  Primary cancer of right upper lobe of lung ([place]) Staging form: Lung, AJCC 8th Edition - Clinical: Stage IVA (cT2, cN0, cM1b) - Signed by Cammie Sickle, MD on 02/19/2021   Oncology History Overview Note  DIAGNOSIS:  A. BONE, T12 VERTEBRAL BODY LESION; BIOPSY:  - METASTATIC SQUAMOUS CELL CARCINOMA.  - SEE COMMENT   1. No acute findings or explanation for the patient's symptoms. No evidence of urinary tract calculus, hydronephrosis or bowel wall thickening. 2. Indeterminate sclerotic lesion in the right aspect of the T12 vertebral body. Given the patient's history, this could reflect metastatic disease (potentially treated). No other signs of metastatic breast cancer. Consider further assessment with whole-body bone scan or thoracic MRI. 3. Left renal cysts.   # s/p palliative radiation to the T12 lesion [11/04]; awaiting  RT  right upper lobe nodule-start next week [until 11/21].  # week of 11/28- Keytruda [TPS-1%]     # Melanoma of right cheek- s/p [15-20 years ago]   # right lumpectomy- Breast cancer [Dr.Crystal s/p RT; pills x 5 years- 2002]   Cancer, metastatic to bone ([place])  01/20/2021 Initial Diagnosis   Cancer, metastatic to bone ([place])   03/09/2021 -  Chemotherapy   Patient is on Treatment Plan : LUNG NSCLC flat dose Pembrolizumab Q21D     Primary cancer of right upper lobe of lung ([place])  02/19/2021 Initial Diagnosis   Primary cancer of right upper lobe of lung ([place])   02/19/2021 Cancer Staging   Staging form: Lung, AJCC 8th Edition - Clinical: Stage IVA (cT2, cN0, cM1b) - Signed  by Cammie Sickle, MD on 02/19/2021       HPI: Ambulating independently.  Accompanied by daughter, Lavanna Rog 80 y.o.  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  In the interim patient was evaluated by symptom management-for pain control.  Patient states her pain is poorly controlled.  However she is not taking breakthrough pain medication as recommended.   Review of Systems  Constitutional:  Positive for malaise/fatigue. Negative for chills, diaphoresis, fever and weight loss.  HENT:  Negative for nosebleeds and sore throat.   Eyes:  Negative for double vision.  Respiratory:  Negative for cough, hemoptysis, sputum production, shortness of breath and wheezing.   Cardiovascular:  Negative for chest pain, palpitations, orthopnea and leg swelling.  Gastrointestinal:  Negative for abdominal pain, blood in stool, constipation, diarrhea, heartburn, melena, nausea and vomiting.  Genitourinary:  Negative for dysuria, frequency and urgency.  Musculoskeletal:  Positive for back pain. Negative for joint pain.  Skin: Negative.  Negative for itching and rash.  Neurological:  Negative for dizziness, tingling, focal weakness, weakness and headaches.  Endo/Heme/Allergies:  Does not bruise/bleed easily.  Psychiatric/Behavioral:  Negative for depression. The patient is not nervous/anxious and does not have insomnia.      PAST MEDICAL HISTORY :  Past Medical History:  Diagnosis Date   Atrial fibrillation ([place])    Breast cancer ([place]) 2002   positive   Carotid bruit  Coronary artery disease    Dental bridge present    lower left - permanent   Depression    GERD (gastroesophageal reflux disease)    Gout    Hyperlipidemia    Mitral valve regurgitation    Personal history of radiation therapy    Raynaud's disease    Wolff-Parkinson-White  syndrome     PAST SURGICAL HISTORY :   Past Surgical History:  Procedure Laterality Date   ABDOMINAL HYSTERECTOMY     BREAST EXCISIONAL BIOPSY Right 1980   neg bx   BREAST EXCISIONAL BIOPSY Right 2002   breast ca radation   BREAST LUMPECTOMY     CARDIOVERSION N/A 05/10/2019   Procedure: CARDIOVERSION;  Surgeon: Corey Skains, MD;  Location: [facility] OR[facility];  Service: Cardiovascular;  Laterality: N/A;   CLOSED REDUCTION CLAVICLE FRACTURE     ELECTROPHYSIOLOGIC STUDY N/A 12/02/2015   Procedure: CARDIOVERSION;  Surgeon: Dionisio David, MD;  Location: [facility] OR[facility];  Service: Cardiovascular;  Laterality: N/A;   IR IMAGING GUIDED PORT INSERTION  02/18/2021   PTOSIS REPAIR Bilateral 11/29/2017   Procedure: BLEPHAROPTOSIS REPAIR RESECT EX;  Surgeon: Karle Starch, MD;  Location: [place];  Service: Ophthalmology;  Laterality: Bilateral;    FAMILY HISTORY :   Family History  Problem Relation Age of Onset   Heart attack Mother    Heart attack Father    Breast cancer Sister    Heart attack Sister    Breast cancer Sister    Heart attack Sister    Heart attack Brother    Heart attack Brother     SOCIAL HISTORY:   Social History   Tobacco Use   Smoking status: Former    Packs/day: 1.00    Years: 30.00    Pack years: 30.00    Types: Cigarettes    Quit date: 1991    Years since quitting: 31.9   Smokeless tobacco: Former    Quit date: 12/01/1989  Vaping Use   Vaping Use: Never used  Substance Use Topics   Alcohol use: No   Drug use: No    ALLERGIES:  is allergic to crestor [rosuvastatin calcium] and lipitor [atorvastatin].  MEDICATIONS:  Current Outpatient Medications  Medication Sig Dispense Refill   allopurinol (ZYLOPRIM) 300 MG tablet Take 300 mg by mouth daily.     amiodarone (PACERONE) 200 MG tablet Take 1 tablet (200 mg total) by mouth daily. 30 tablet 6   apixaban (ELIQUIS) 5 MG TABS tablet Take 1 tablet (5 mg total) by mouth 2 (two) times daily. 60 tablet 11    calcium carbonate (TUMS - DOSED IN MG ELEMENTAL CALCIUM) 500 MG chewable tablet Chew 1 tablet by mouth daily.     Carboxymethylcellul-Glycerin (CLEAR EYES FOR DRY EYES) 1-0.25 % SOLN Place 1 drop into both eyes daily as needed (Dry eyes).     dexamethasone (DECADRON) 2 MG tablet Take 1 tablet (2 mg total) by mouth 2 (two) times daily. 20 tablet 0   fluticasone (FLONASE) 50 MCG/ACT nasal spray Place 2 sprays into both nostrils daily as needed for allergies or rhinitis.     furosemide (LASIX) 20 MG tablet Take 20 mg by mouth daily as needed for fluid.      gabapentin (NEURONTIN) 100 MG capsule Take 100 mg by mouth at bedtime.     gemfibrozil (LOPID) 600 MG tablet Take 600 mg by mouth daily.      lidocaine-prilocaine (EMLA) cream Apply 1 application topically as needed. Apply 1 application  topically as needed. Apply to port and cover with saran wrap 1-2 hours prior to port access 30 g 0   metoprolol succinate (TOPROL-XL) 50 MG 24 hr tablet Take 50 mg by mouth daily.     naloxone (NARCAN) nasal spray 4 mg/0.1 mL SPRAY 1 SPRAY INTO ONE NOSTRIL AS DIRECTED FOR OPIOID OVERDOSE (TURN PERSON ON SIDE AFTER DOSE. IF NO RESPONSE IN 2-3 MINUTES OR PERSON RESPONDS BUT RELAPSES, REPEAT USING A NEW SPRAY DEVICE AND SPRAY INTO THE OTHER NOSTRIL. CALL 911 AFTER USE.) * EMERGENCY USE ONLY * 1 each 0   ondansetron (ZOFRAN) 8 MG tablet Take 1 tablet (8 mg total) by mouth 2 (two) times daily as needed for nausea or vomiting. 30 tablet 1   oxyCODONE (OXYCONTIN) 20 mg 12 hr tablet Take 1 tablet (20 mg total) by mouth every 12 (twelve) hours. 30 tablet 0   Oxycodone HCl 10 MG TABS Take 1-2 tablets (10-20 mg total) by mouth every 4 (four) hours as needed (pain). 90 tablet 0   potassium chloride (KLOR-CON) 10 MEQ tablet Take by mouth.     prochlorperazine (COMPAZINE) 10 MG tablet Take 1 tablet (10 mg total) by mouth every 6 (six) hours as needed for nausea or vomiting. 30 tablet 1   traZODone (DESYREL) 50 MG tablet Take 50 mg  by mouth at bedtime.     venlafaxine (EFFEXOR) 75 MG tablet Take 75 mg by mouth daily.      Vitamin D, Ergocalciferol, (DRISDOL) 50000 units CAPS capsule Take 50,000 Units by mouth every 7 (seven) days. Friday     ezetimibe (ZETIA) 10 MG tablet Take 1 tablet (10 mg total) by mouth daily. 90 tablet 3   metaxalone (SKELAXIN) 800 MG tablet Take by mouth. (Patient not taking: Reported on 01/20/2021)     No current facility-administered medications for this visit.    PHYSICAL EXAMINATION: ECOG PERFORMANCE STATUS: 1 - Symptomatic but completely ambulatory  BP (!) 125/96   Pulse (!) 117   Temp 97.8 F (36.6 C)   Resp 18   Wt 95 lb 3.2 oz (43.2 kg)   SpO2 96%   BMI 17.41 kg/m   Filed Weights   03/09/21 1021  Weight: 95 lb 3.2 oz (43.2 kg)    Physical Exam Vitals and nursing note reviewed.  HENT:     Head: Normocephalic and atraumatic.     Mouth/Throat:     Pharynx: Oropharynx is clear.  Eyes:     Extraocular Movements: Extraocular movements intact.     Pupils: Pupils are equal, round, and reactive to light.  Cardiovascular:     Rate and Rhythm: Normal rate and regular rhythm.  Pulmonary:     Comments: Decreased breath sounds bilaterally.  Abdominal:     Palpations: Abdomen is soft.  Musculoskeletal:        General: Normal range of motion.     Cervical back: Normal range of motion.  Skin:    General: Skin is warm.  Neurological:     General: No focal deficit present.     Mental Status: She is alert and oriented to person, place, and time.  Psychiatric:        Behavior: Behavior normal.        Judgment: Judgment normal.       LABORATORY DATA:  I have reviewed the data as listed    Component Value Date/Time   NA 135 03/09/2021 0943   NA 141 10/21/2012 0339   K 3.2 (L) 03/09/2021  0943   K 4.7 10/21/2012 0339   CL 97 (L) 03/09/2021 0943   CL 112 (H) 10/21/2012 0339   CO2 28 03/09/2021 0943   CO2 22 10/21/2012 0339   GLUCOSE 134 (H) 03/09/2021 0943   GLUCOSE  125 (H) 10/21/2012 0339   BUN 16 03/09/2021 0943   BUN 20 (H) 10/21/2012 0339   CREATININE 0.91 03/09/2021 0943   CREATININE 0.91 10/21/2012 0339   CALCIUM 8.4 (L) 03/09/2021 0943   CALCIUM 8.3 (L) 10/21/2012 0339   PROT 6.7 03/09/2021 0943   PROT 7.6 10/18/2012 0855   ALBUMIN 3.6 03/09/2021 0943   ALBUMIN 3.8 10/18/2012 0855   AST 21 03/09/2021 0943   AST 82 (H) 10/18/2012 0855   ALT 22 03/09/2021 0943   ALT 37 10/18/2012 0855   ALKPHOS 77 03/09/2021 0943   ALKPHOS 96 10/18/2012 0855   BILITOT 1.0 03/09/2021 0943   BILITOT 0.7 10/18/2012 0855   GFRNONAA >60 03/09/2021 0943   GFRNONAA >60 10/21/2012 0339   GFRAA >60 10/21/2012 0339    No results found for: SPEP, UPEP  Lab Results  Component Value Date   WBC 12.8 (H) 03/09/2021   NEUTROABS 11.9 (H) 03/09/2021   HGB 12.8 03/09/2021   HCT 38.7 03/09/2021   MCV 89.6 03/09/2021   PLT 255 03/09/2021      Chemistry      Component Value Date/Time   NA 135 03/09/2021 0943   NA 141 10/21/2012 0339   K 3.2 (L) 03/09/2021 0943   K 4.7 10/21/2012 0339   CL 97 (L) 03/09/2021 0943   CL 112 (H) 10/21/2012 0339   CO2 28 03/09/2021 0943   CO2 22 10/21/2012 0339   BUN 16 03/09/2021 0943   BUN 20 (H) 10/21/2012 0339   CREATININE 0.91 03/09/2021 0943   CREATININE 0.91 10/21/2012 0339      Component Value Date/Time   CALCIUM 8.4 (L) 03/09/2021 0943   CALCIUM 8.3 (L) 10/21/2012 0339   ALKPHOS 77 03/09/2021 0943   ALKPHOS 96 10/18/2012 0855   AST 21 03/09/2021 0943   AST 82 (H) 10/18/2012 0855   ALT 22 03/09/2021 0943   ALT 37 10/18/2012 0855   BILITOT 1.0 03/09/2021 0943   BILITOT 0.7 10/18/2012 0855       RADIOGRAPHIC STUDIES: I have personally reviewed the radiological images as listed and agreed with the findings in the report. No results found.   ASSESSMENT & PLAN:  Primary cancer of right upper lobe of lung ([place]) #Stage IV -right upper lobe lung cancer /- Squamous cell carcinoma bone metastatic to T12- s/p  Biopsy. PET OCT 2022- Right upper lobe hypermetabolic pulmonary nodule and adjacent interstitial thickening, most consistent with primary bronchogeniccarcinoma. T12 and right seventh rib osseous metastasis.  Postradiation to the lung/T12 vertebral lesion-PET scan number 23rd [id number] metabolic activity post radiation; slightly increased metabolic activity at the site of ninth and seventh rib osseous metastasis.  Otherwise no evidence of any distant metastatic disease.   #TPS 1-recommend single agent Keytruda. Labs today reviewed;  acceptable for treatment today. Marland Kitchen  However patient wants to hold off therapy today because of social reasons.  We will reschedule later this week.  # Back pain: Secondary to underlying malignancy ; continue oxycontine 20 mg BID; 10 mg oxycodone 1-2 pills every 4-6 hours as needed.  Poorly controlled again reiterated the pain schedule/follow-up with Josh. Discuss Zometa.   #IV access discussed: ? port placement.   # DISPOSITION:  # re-schedule Keytruda to  later this week/pt pref # follow up in 3 weeks- MD; labs- cbc/cmp;Keytruda-Dr.B  # I reviewed the blood work- with the patient in detail; also reviewed the imaging independently [as summarized above]; and with the patient in detail.    Orders Placed This Encounter  Procedures   CBC with Differential/Platelet    Standing Status:   Future    Standing Expiration Date:   03/09/2022   Comprehensive metabolic panel    Standing Status:   Future    Standing Expiration Date:   03/09/2022   All questions were answered. The patient knows to call the clinic with any problems, questions or concerns.      Cammie Sickle, MD 03/09/2021 12:01 PM

## 2021-03-09 NOTE — Assessment & Plan Note (Addendum)
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 PET OCT 2022- Right upper lobe hypermetabolic pulmonary nodule and adjacent interstitial thickening, most consistent with primary bronchogeniccarcinoma. T12 and right seventh rib osseous metastasis.  Postradiation to the lung/T12 vertebral lesion-PET scan number 23rd [id number] metabolic activity post radiation; slightly increased metabolic activity at the site of ninth and seventh rib osseous metastasis.  Otherwise no evidence of any distant metastatic disease.  #TPS 1-recommend single agent Keytruda. Labs today reviewed;  acceptable for treatment today. Marland Kitchen  However patient wants to hold off therapy today because of social reasons.  We will reschedule later this week.  # Back pain: Secondary to underlying malignancy ; continue oxycontine 20 mg BID; 10 mg oxycodone 1-2 pills every 4-6 hours as needed.  Poorly controlled again reiterated the pain schedule/follow-up with Josh. Discuss Zometa.   #IV access discussed: ? port placement.   # DISPOSITION:  # re-schedule Keytruda to later this week/pt pref # follow up in 3 weeks- MD; labs- cbc/cmp;Keytruda-Dr.B  # I reviewed the blood work- with the patient in detail; also reviewed the imaging independently [as summarized above]; and with the patient in detail.

## 2021-03-10 ENCOUNTER — Ambulatory Visit: Payer: Medicare Other

## 2021-03-10 ENCOUNTER — Ambulatory Visit
Admission: RE | Admit: 2021-03-10 | Discharge: 2021-03-10 | Disposition: A | Discharge status: Home / Self Care | Payer: Medicare Other | Source: Ambulatory Visit | Attending: Radiation Oncology | Admitting: Radiation Oncology

## 2021-03-10 DIAGNOSIS — Z510 Encounter for antineoplastic radiation therapy: Secondary | ICD-10-CM | POA: Diagnosis not present

## 2021-03-11 ENCOUNTER — Ambulatory Visit
Admission: RE | Admit: 2021-03-11 | Discharge: 2021-03-11 | Disposition: A | Discharge status: Home / Self Care | Payer: Medicare Other | Source: Ambulatory Visit | Attending: Radiation Oncology | Admitting: Radiation Oncology

## 2021-03-11 ENCOUNTER — Ambulatory Visit: Payer: Medicare Other

## 2021-03-11 DIAGNOSIS — Z510 Encounter for antineoplastic radiation therapy: Secondary | ICD-10-CM | POA: Diagnosis not present

## 2021-03-12 ENCOUNTER — Telehealth: Payer: Self-pay | Admitting: Internal Medicine

## 2021-03-12 NOTE — Telephone Encounter (Signed)
NEVER MIND LOL

## 2021-03-12 NOTE — Telephone Encounter (Signed)
Sarah Li I think this is for you about her radiation cause I dont see any type of infusion

## 2021-03-12 NOTE — Telephone Encounter (Signed)
Pt called wanting to know since she missed her infusion on Monday,because computers were down,if she was going to be rescheduled. Please give her a call back at [phone]

## 2021-03-13 NOTE — Progress Notes (Signed)
Blood glucose check is part of PET scan protocol.  GB

## 2021-03-16 ENCOUNTER — Other Ambulatory Visit: Payer: Self-pay

## 2021-03-16 ENCOUNTER — Telehealth: Payer: Self-pay

## 2021-03-16 ENCOUNTER — Inpatient Hospital Stay: Payer: Medicare Other

## 2021-03-16 ENCOUNTER — Inpatient Hospital Stay (HOSPITAL_BASED_OUTPATIENT_CLINIC_OR_DEPARTMENT_OTHER): Payer: Medicare Other | Admitting: Hospice and Palliative Medicine

## 2021-03-16 ENCOUNTER — Inpatient Hospital Stay: Payer: Medicare Other | Attending: Internal Medicine

## 2021-03-16 DIAGNOSIS — Z90710 Acquired absence of both cervix and uterus: Secondary | ICD-10-CM | POA: Diagnosis not present

## 2021-03-16 DIAGNOSIS — R062 Wheezing: Secondary | ICD-10-CM | POA: Diagnosis not present

## 2021-03-16 DIAGNOSIS — I4891 Unspecified atrial fibrillation: Secondary | ICD-10-CM | POA: Diagnosis not present

## 2021-03-16 DIAGNOSIS — R0602 Shortness of breath: Secondary | ICD-10-CM | POA: Diagnosis not present

## 2021-03-16 DIAGNOSIS — C7951 Secondary malignant neoplasm of bone: Secondary | ICD-10-CM

## 2021-03-16 DIAGNOSIS — Z7901 Long term (current) use of anticoagulants: Secondary | ICD-10-CM | POA: Insufficient documentation

## 2021-03-16 DIAGNOSIS — F419 Anxiety disorder, unspecified: Secondary | ICD-10-CM | POA: Insufficient documentation

## 2021-03-16 DIAGNOSIS — G893 Neoplasm related pain (acute) (chronic): Secondary | ICD-10-CM | POA: Insufficient documentation

## 2021-03-16 DIAGNOSIS — Z87891 Personal history of nicotine dependence: Secondary | ICD-10-CM | POA: Insufficient documentation

## 2021-03-16 DIAGNOSIS — Z803 Family history of malignant neoplasm of breast: Secondary | ICD-10-CM | POA: Insufficient documentation

## 2021-03-16 DIAGNOSIS — E876 Hypokalemia: Secondary | ICD-10-CM | POA: Insufficient documentation

## 2021-03-16 DIAGNOSIS — Z95828 Presence of other vascular implants and grafts: Secondary | ICD-10-CM

## 2021-03-16 DIAGNOSIS — M549 Dorsalgia, unspecified: Secondary | ICD-10-CM | POA: Insufficient documentation

## 2021-03-16 DIAGNOSIS — C3411 Malignant neoplasm of upper lobe, right bronchus or lung: Secondary | ICD-10-CM

## 2021-03-16 DIAGNOSIS — J029 Acute pharyngitis, unspecified: Secondary | ICD-10-CM | POA: Diagnosis not present

## 2021-03-16 LAB — COMPREHENSIVE METABOLIC PANEL
ALT: 19 U/L (ref 0–44)
AST: 24 U/L (ref 15–41)
Albumin: 3.5 g/dL (ref 3.5–5.0)
Alkaline Phosphatase: 71 U/L (ref 38–126)
Anion gap: 13 (ref 5–15)
BUN: 16 mg/dL (ref 8–23)
CO2: 25 mmol/L (ref 22–32)
Calcium: 8.5 mg/dL — ABNORMAL LOW (ref 8.9–10.3)
Chloride: 96 mmol/L — ABNORMAL LOW (ref 98–111)
Creatinine, Ser: 0.8 mg/dL (ref 0.44–1.00)
GFR, Estimated: >60 mL/min (ref >60)
Glucose, Bld: 130 mg/dL — ABNORMAL HIGH (ref 70–99)
Potassium: 3.3 mmol/L — ABNORMAL LOW (ref 3.5–5.1)
Sodium: 134 mmol/L — ABNORMAL LOW (ref 135–145)
Total Bilirubin: 1 mg/dL (ref 0.3–1.2)
Total Protein: 6.5 g/dL (ref 6.5–8.1)

## 2021-03-16 LAB — CBC WITH DIFFERENTIAL/PLATELET
Abs Immature Granulocytes: 0.08 10*3/uL — ABNORMAL HIGH (ref 0.00–0.07)
Basophils Absolute: 0 10*3/uL (ref 0.0–0.1)
Basophils Relative: 0 %
Eosinophils Absolute: 0 10*3/uL (ref 0.0–0.5)
Eosinophils Relative: 0 %
HCT: 38.6 % (ref 36.0–46.0)
Hemoglobin: 12.7 g/dL (ref 12.0–15.0)
Immature Granulocytes: 1 %
Lymphocytes Relative: 2 %
Lymphs Abs: 0.2 10*3/uL — ABNORMAL LOW (ref 0.7–4.0)
MCH: 29.7 pg (ref 26.0–34.0)
MCHC: 32.9 g/dL (ref 30.0–36.0)
MCV: 90.2 fL (ref 80.0–100.0)
Monocytes Absolute: 0.5 10*3/uL (ref 0.1–1.0)
Monocytes Relative: 5 %
Neutro Abs: 10 10*3/uL — ABNORMAL HIGH (ref 1.7–7.7)
Neutrophils Relative %: 92 %
Platelets: 200 10*3/uL (ref 150–400)
RBC: 4.28 MIL/uL (ref 3.87–5.11)
RDW: 14.4 % (ref 11.5–15.5)
WBC: 10.8 10*3/uL — ABNORMAL HIGH (ref 4.0–10.5)
nRBC: 0 % (ref 0.0–0.2)

## 2021-03-16 MED ORDER — OXYCODONE HCL ER 20 MG PO T12A: 20.0000 mg | EXTENDED_RELEASE_TABLET | Freq: Two times a day (BID) | ORAL | 0 refills | Status: DC

## 2021-03-16 MED ORDER — POTASSIUM CHLORIDE ER 10 MEQ PO TBCR
10.0000 meq | EXTENDED_RELEASE_TABLET | Freq: Every day | ORAL | 0 refills | Status: DC
Start: 2021-03-16 — End: 2021-03-24

## 2021-03-16 MED ORDER — OXYCODONE HCL 10 MG PO TABS: 10.0000 mg | ORAL_TABLET | ORAL | 0 refills | Status: AC | PRN

## 2021-03-16 MED ORDER — HEPARIN SOD (PORK) LOCK FLUSH 100 UNIT/ML IV SOLN
500.0000 [IU] | Freq: Once | INTRAVENOUS | Status: AC
  Administered 2021-03-16: 500 [IU] via INTRAVENOUS
  Filled 2021-03-16: qty 5

## 2021-03-16 MED ORDER — SODIUM CHLORIDE 0.9% FLUSH
10.0000 mL | Freq: Once | INTRAVENOUS | Status: AC
  Administered 2021-03-16: 10 mL via INTRAVENOUS
  Filled 2021-03-16: qty 10

## 2021-03-16 NOTE — Research (Signed)
SCOR Chair Exercise Pilot Study:   Research RN met with the patient in the infusion room this morning. The patient states she has read over the protocol consent and it looks really exciting, but she does not want to participate at this time. She states she would rather wait until after Christmas. Research nurse explained how she would not be eligible for the study at that time. Patient was thanked for her time and consideration in participating in research here at [facility]. Approximately 10 minutes was spent in conversation with the patient this morning.   Jeral Fruit, RN 03/16/21 9:51 AM

## 2021-03-16 NOTE — Progress Notes (Signed)
Pt presents to [facility] from infusion room with report from RN of elevated pulse. Pt has history of A-fib. Pt states that she stays in a-fib, and denies any symptoms at this time. States that she last saw her cardiologist in November and received a good report.

## 2021-03-16 NOTE — Progress Notes (Signed)
0913- Pulse rate: 120 on Dinamap machine and manual recheck 135. Pulse irregular. Patient denies any symptoms at this time. Patient states, "my heart rhythm is always A-fib. They have tried to shock it back before and it doesn't work." MD, Dr. Rogue Bussing, notified.  0928- Per MD, Dr. Rogue Bussing, order: patient needs further evaluation by the symptom management clinic and will be added on to see symptom management clinic today; hold [place] treatment until further evaluation.  1144- Per NP, Josh Borders, order: hold Keytruda treatment today; patient to return to clinic next week.

## 2021-03-16 NOTE — Telephone Encounter (Signed)
Called [facility] cardiology and informed of today's events in clinic re: heart rate and EKG results. Spoke to staff member, Vida Roller, who requested EKG be faxed. Stated that she would call pt, as they would like to see her in clinic this afternoon.

## 2021-03-16 NOTE — Progress Notes (Signed)
No treatment today. HR 145-149 this morning. Pt did not take her metoprolol this morning. Asymptomatic. Denies any discomfort. Discharged to home. Pushed to her car via wheelchair by RN.

## 2021-03-16 NOTE — Progress Notes (Signed)
Symptom Management [place] at [facility] [phone] [phone]) [phone]  Patient Care Team: Idelle Crouch, MD as PCP - General (Internal Medicine) Minna Merritts, MD as Consulting Physician (Cardiology) Cammie Sickle, MD as Consulting Physician (Hematology and Oncology)   Name of the patient: Chrishana Spargur  [id number]  [birth year]   Date of visit: 03/16/21  Reason for Consult:  NAZIFA TRINKA is a 80 y.o. 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  Patient has T12 and right seventh rib osseous metastasis status post XRT.  She has had severe back pain and is followed by palliative care for symptom management.  Patient was scheduled for single agent Keytruda today but was found to have tachycardia and was sent to [facility] for evaluation.  Patient has history of A. fib with RVR status post cardioversion in January 2021.  However, she has maintained persistent A. fib but heart rate has generally been well controlled on metoprolol XL 50 mg daily.  Patient is on chronic anticoagulation with Eliquis.  Upon review of vitals -heart rate has generally ranged from 86-107 in our clinic over the past month.  Patient is established with Dr. Nehemiah Massed with current clinic cardiology.  She last saw Jettie Booze, NP on 02/19/2021 but appeared to be in her usual state of health without significant changes made at that visit.  Today, patient is asymptomatic.  She denies fatigue, shortness of breath, chest pain, edema.  Patient says that she forgot to take her metoprolol this morning.  She normally takes metoprolol in the morning and checks her heart rate at home.  She denies any recent elevation in her heart rate.  Denies any neurologic complaints. Denies recent fevers or illnesses. Denies any easy bleeding or bruising. Reports good appetite and denies weight loss. Denies chest pain.  Denies any nausea, vomiting, constipation, or diarrhea. Denies urinary complaints. Patient offers no further specific complaints today.  PAST MEDICAL HISTORY: Past Medical History:  Diagnosis Date   Atrial fibrillation ([place])    Breast cancer ([place]) 2002   positive   Carotid bruit    Coronary artery disease    Dental bridge present    lower left - permanent   Depression    GERD (gastroesophageal reflux disease)    Gout    Hyperlipidemia    Mitral valve regurgitation    Personal history of radiation therapy    Raynaud's disease    Wolff-Parkinson-White syndrome     PAST SURGICAL HISTORY:  Past Surgical History:  Procedure Laterality Date   ABDOMINAL HYSTERECTOMY     BREAST EXCISIONAL BIOPSY Right 1980   neg bx   BREAST EXCISIONAL BIOPSY Right 2002   breast ca radation   BREAST LUMPECTOMY     CARDIOVERSION N/A 05/10/2019   Procedure: CARDIOVERSION;  Surgeon: Corey Skains, MD;  Location: [facility] ORS;  Service: Cardiovascular;  Laterality: N/A;   [place] STUDY N/A 12/02/2015   Procedure: CARDIOVERSION;  Surgeon: Dionisio David, MD;  Location: [facility] ORS;  Service: Cardiovascular;  Laterality: N/A;   IR IMAGING GUIDED PORT INSERTION  02/18/2021   PTOSIS REPAIR Bilateral 11/29/2017   Procedure: BLEPHAROPTOSIS REPAIR RESECT EX;  Surgeon: Karle Starch, MD;  Location: [place];  Service: Ophthalmology;  Laterality: Bilateral;    HEMATOLOGY/ONCOLOGY HISTORY:  Oncology History Overview Note  DIAGNOSIS:  A. BONE, T12 VERTEBRAL BODY LESION; BIOPSY:  -  METASTATIC SQUAMOUS CELL CARCINOMA.  - SEE COMMENT   1. No acute findings or explanation for the patient's symptoms. No evidence of urinary tract calculus, hydronephrosis or bowel wall thickening. 2. Indeterminate sclerotic lesion in the right aspect of the T12 vertebral body. Given the patient's history, this could reflect metastatic disease (potentially treated). No other  signs of metastatic breast cancer. Consider further assessment with whole-body bone scan or thoracic MRI. 3. Left renal cysts.   # s/p palliative radiation to the T12 lesion [11/04]; awaiting  RT  right upper lobe nodule-start next week [until 11/21].  # week of 11/28- Keytruda [TPS-1%]     # Melanoma of right cheek- s/p [15-20 years ago]   # right lumpectomy- Breast cancer [Dr.Crystal s/p RT; pills x 5 years- 2002]   Cancer, metastatic to bone ([place])  01/20/2021 Initial Diagnosis   Cancer, metastatic to bone ([place])   03/09/2021 -  Chemotherapy   Patient is on Treatment Plan : LUNG NSCLC flat dose Pembrolizumab Q21D     Primary cancer of right upper lobe of lung ([place])  02/19/2021 Initial Diagnosis   Primary cancer of right upper lobe of lung ([place])   02/19/2021 Cancer Staging   Staging form: Lung, AJCC 8th Edition - Clinical: Stage IVA (cT2, cN0, cM1b) - Signed by Cammie Sickle, MD on 02/19/2021      ALLERGIES:  is allergic to crestor [rosuvastatin calcium] and lipitor [atorvastatin].  MEDICATIONS:  Current Outpatient Medications  Medication Sig Dispense Refill   allopurinol (ZYLOPRIM) 300 MG tablet Take 300 mg by mouth daily.     amiodarone (PACERONE) 200 MG tablet Take 1 tablet (200 mg total) by mouth daily. 30 tablet 6   apixaban (ELIQUIS) 5 MG TABS tablet Take 1 tablet (5 mg total) by mouth 2 (two) times daily. 60 tablet 11   calcium carbonate (TUMS - DOSED IN MG ELEMENTAL CALCIUM) 500 MG chewable tablet Chew 1 tablet by mouth daily.     Carboxymethylcellul-Glycerin (CLEAR EYES FOR DRY EYES) 1-0.25 % SOLN Place 1 drop into both eyes daily as needed (Dry eyes).     dexamethasone (DECADRON) 2 MG tablet Take 1 tablet (2 mg total) by mouth 2 (two) times daily. 20 tablet 0   ezetimibe (ZETIA) 10 MG tablet Take 1 tablet (10 mg total) by mouth daily. 90 tablet 3   fluticasone (FLONASE) 50 MCG/ACT nasal spray Place 2 sprays into both nostrils daily as needed for  allergies or rhinitis.     furosemide (LASIX) 20 MG tablet Take 20 mg by mouth daily as needed for fluid.      gabapentin (NEURONTIN) 100 MG capsule Take 100 mg by mouth at bedtime.     gemfibrozil (LOPID) 600 MG tablet Take 600 mg by mouth daily.      lidocaine-prilocaine (EMLA) cream Apply 1 application topically as needed. Apply 1 application topically as needed. Apply to port and cover with saran wrap 1-2 hours prior to port access 30 g 0   metaxalone (SKELAXIN) 800 MG tablet Take by mouth. (Patient not taking: Reported on 01/20/2021)     metoprolol succinate (TOPROL-XL) 50 MG 24 hr tablet Take 50 mg by mouth daily.     naloxone (NARCAN) nasal spray 4 mg/0.1 mL SPRAY 1 SPRAY INTO ONE NOSTRIL AS DIRECTED FOR OPIOID OVERDOSE (TURN PERSON ON SIDE AFTER DOSE. IF NO RESPONSE IN 2-3 MINUTES OR PERSON RESPONDS BUT RELAPSES, REPEAT USING A NEW SPRAY DEVICE AND SPRAY INTO THE OTHER NOSTRIL. CALL 911 AFTER  USE.) * EMERGENCY USE ONLY * 1 each 0   ondansetron (ZOFRAN) 8 MG tablet Take 1 tablet (8 mg total) by mouth 2 (two) times daily as needed for nausea or vomiting. 30 tablet 1   oxyCODONE (OXYCONTIN) 20 mg 12 hr tablet Take 1 tablet (20 mg total) by mouth every 12 (twelve) hours. 30 tablet 0   Oxycodone HCl 10 MG TABS Take 1-2 tablets (10-20 mg total) by mouth every 4 (four) hours as needed (pain). 90 tablet 0   potassium chloride (KLOR-CON) 10 MEQ tablet Take by mouth.     prochlorperazine (COMPAZINE) 10 MG tablet Take 1 tablet (10 mg total) by mouth every 6 (six) hours as needed for nausea or vomiting. 30 tablet 1   traZODone (DESYREL) 50 MG tablet Take 50 mg by mouth at bedtime.     venlafaxine (EFFEXOR) 75 MG tablet Take 75 mg by mouth daily.      Vitamin D, Ergocalciferol, (DRISDOL) 50000 units CAPS capsule Take 50,000 Units by mouth every 7 (seven) days. Friday     No current facility-administered medications for this visit.    VITAL SIGNS: There were no vitals taken for this visit. There were  no vitals filed for this visit.  Estimated body mass index is 17.01 kg/m as calculated from the following:   Height as of 02/18/21: 5\' 2"  (1.575 m).   Weight as of an earlier encounter on 03/16/21: 93 lb (42.2 kg).  LABS: CBC:    Component Value Date/Time   WBC 10.8 (H) 03/16/2021 1009   HGB 12.7 03/16/2021 1009   HGB 11.0 (L) 10/21/2012 0339   HCT 38.6 03/16/2021 1009   HCT 33.1 (L) 10/21/2012 0339   PLT 200 03/16/2021 1009   PLT 289 10/21/2012 0339   MCV 90.2 03/16/2021 1009   MCV 94 10/21/2012 0339   NEUTROABS 10.0 (H) 03/16/2021 1009   NEUTROABS 7.8 (H) 10/21/2012 0339   LYMPHSABS 0.2 (L) 03/16/2021 1009   LYMPHSABS 0.5 (L) 10/21/2012 0339   MONOABS 0.5 03/16/2021 1009   MONOABS 0.5 10/21/2012 0339   EOSABS 0.0 03/16/2021 1009   EOSABS 0.0 10/21/2012 0339   BASOSABS 0.0 03/16/2021 1009   BASOSABS 0.0 10/21/2012 0339   Comprehensive Metabolic Panel:    Component Value Date/Time   NA 134 (L) 03/16/2021 1009   NA 141 10/21/2012 0339   K 3.3 (L) 03/16/2021 1009   K 4.7 10/21/2012 0339   CL 96 (L) 03/16/2021 1009   CL 112 (H) 10/21/2012 0339   CO2 25 03/16/2021 1009   CO2 22 10/21/2012 0339   BUN 16 03/16/2021 1009   BUN 20 (H) 10/21/2012 0339   CREATININE 0.80 03/16/2021 1009   CREATININE 0.91 10/21/2012 0339   GLUCOSE 130 (H) 03/16/2021 1009   GLUCOSE 125 (H) 10/21/2012 0339   CALCIUM 8.5 (L) 03/16/2021 1009   CALCIUM 8.3 (L) 10/21/2012 0339   AST 24 03/16/2021 1009   AST 82 (H) 10/18/2012 0855   ALT 19 03/16/2021 1009   ALT 37 10/18/2012 0855   ALKPHOS 71 03/16/2021 1009   ALKPHOS 96 10/18/2012 0855   BILITOT 1.0 03/16/2021 1009   BILITOT 0.7 10/18/2012 0855   PROT 6.5 03/16/2021 1009   PROT 7.6 10/18/2012 0855   ALBUMIN 3.5 03/16/2021 1009   ALBUMIN 3.8 10/18/2012 0855    RADIOGRAPHIC STUDIES: NM PET Image Initial (PI) Skull Base To Thigh (F-18 FDG)  Result Date: 03/04/2021 CLINICAL DATA:  Subsequent treatment strategy for metastatic cancer of  unknown  primary. Radiation and steroid therapy. Planned Keytruda therapy. EXAM: NUCLEAR MEDICINE PET SKULL BASE TO THIGH TECHNIQUE: 5.57 mCi F-18 FDG was injected intravenously. Full-ring PET imaging was performed from the skull base to thigh after the radiotracer. CT data was obtained and used for attenuation correction and anatomic localization. Fasting blood glucose: 84 mg/dl COMPARISON:  PET-CT 01/22/2021 FINDINGS: Mediastinal blood pool activity: SUV max 1.8 Liver activity: SUV max 2.1 NECK: No hypermetabolic cervical lymph nodes are identified.There are no lesions of the pharyngeal mucosal space. Incidental CT findings: Bilateral carotid atherosclerosis. CHEST: There are no hypermetabolic mediastinal, hilar or axillary lymph nodes. Anterior right upper lobe hypermetabolic nodularity is again noted with improvement in the degree of hypermetabolic activity compared with the previous study. For example, a 2.0 x 1.7 cm nodular component on image 75/3 currently has an SUV max of 2.6; previously 4.6. No new pulmonary hypermetabolic activity or suspicious nodularity. A right pleural effusion has enlarged, without hypermetabolic activity. Incidental CT findings: Moderate to severe centrilobular and paraseptal emphysema with stable biapical scarring. Left IJ Port-A-Cath extends into the superior aspect of the right atrium. There is atherosclerosis of the aorta, great vessels and coronary arteries. ABDOMEN/PELVIS: There is no hypermetabolic activity within the liver, adrenal glands, spleen or pancreas. There is no hypermetabolic nodal activity. Incidental CT findings: Stable left renal lesions without hypermetabolic activity, likely cysts. Moderate stool throughout the colon. Diffuse aortic and branch vessel atherosclerosis. SKELETON: Hypermetabolic, destructive mass involving the T12 vertebral body again noted. This extends into the posterior elements, asymmetric to the right, and is associated with slightly greater  pathologic fracture. SUV max is currently 6.4, previously 7.6. Slightly progressive destruction of the right 7th rib anteriorly (SUV max 3.5). No definite new lesions seen. Incidental CT findings: Previous left clavicular ORIF. Stable intramuscular lipoma posterior to the left shoulder. IMPRESSION: 1. Mildly improved hypermetabolic pulmonary nodularity within the right upper lobe, consistent with response to therapy. 2. Known metastases involving the T12 vertebral body and right 7th rib are slightly progressive, although the metabolic activity within the T12 lesion has slightly improved. No new osseous metastases identified. 3. No other evidence of metastatic disease. 4. Enlarging moderate-sized right pleural effusion. 5. Aortic Atherosclerosis (ICD10-I70.0) and Emphysema (ICD10-J43.9). Electronically Signed   By: Richardean Sale M.D.   On: 03/04/2021 10:47   IR IMAGING GUIDED PORT INSERTION  Result Date: 02/18/2021 INDICATION: 80 year old with metastatic lung cancer. Port-A-Cath needed for chemotherapy. EXAM: FLUOROSCOPIC AND ULTRASOUND GUIDED PLACEMENT OF A SUBCUTANEOUS PORT. Physician: Stephan Minister. Henn, MD MEDICATIONS: Moderate sedation ANESTHESIA/SEDATION: Moderate (conscious) sedation was employed during this procedure. A total of Versed 1.0mg  and fentanyl 25 mcg was administered intravenously at the order of the provider performing the procedure. Total intra-service moderate sedation time: 30 minutes. Patient's level of consciousness and vital signs were monitored continuously by radiology nurse throughout the procedure under the supervision of the provider performing the procedure. FLUOROSCOPY TIME:  1 minute, 2.5 mGy COMPLICATIONS: None immediate. PROCEDURE: The procedure was explained to the patient. The risks and benefits of the procedure were discussed and the patient's questions were addressed. Informed consent was obtained from the patient. Patient was placed supine on the interventional table.  Ultrasound confirmed a patent left internal jugular vein. Ultrasound image was saved for documentation. The left chest and neck were cleaned with a skin antiseptic and a sterile drape was placed. Maximal barrier sterile technique was utilized including caps, mask, sterile gowns, sterile gloves, sterile drape, hand hygiene and skin antiseptic. The left neck  was anesthetized with 1% lidocaine. Small incision was made in the left neck with a blade. Micropuncture set was placed in the left IJ with ultrasound guidance. The micropuncture wire was used for measurement purposes. The left chest was anesthetized with 1% lidocaine with epinephrine. #15 blade was used to make an incision and a subcutaneous port pocket was formed. [place] was assembled. Subcutaneous tunnel was formed with a stiff tunneling device. The port catheter was brought through the subcutaneous tunnel. The port was placed in the subcutaneous pocket. The micropuncture set was exchanged for a peel-away sheath. The catheter was placed through the peel-away sheath and the tip was positioned at the superior cavoatrial junction. Catheter placement was confirmed with fluoroscopy. The port was accessed and flushed with heparinized saline. The port pocket was closed using two layers of absorbable sutures and Dermabond. The vein skin site was closed using a single layer of absorbable suture and Dermabond. Sterile dressings were applied. Patient tolerated the procedure well without an immediate complication. Ultrasound and fluoroscopic images were taken and saved for this procedure. IMPRESSION: Placement of a subcutaneous port device. Catheter tip at the superior cavoatrial junction. Electronically Signed   By: Markus Daft M.D.   On: 02/18/2021 12:50    PERFORMANCE STATUS (ECOG) : 1 - Symptomatic but completely ambulatory  Review of Systems Unless otherwise noted, a complete review of systems is negative.  Physical Exam General:  NAD Cardiovascular: Tachycardic, irregularly irregular Pulmonary: clear anterior/posterior fields Abdomen: soft, nontender, + bowel sounds GU: no suprapubic tenderness Extremities: no edema, no joint deformities Skin: no rashes Neurological: Weakness but otherwise nonfocal  Assessment and Plan- Patient is a 80 y.o. 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  Patient has T12 and right seventh rib osseous metastasis status post XRT.  On single agent Keytruda.  Patient presents to [facility] for evaluation of A. fib with RVR   A. fib with RVR -patient forgot to take her metoprolol this morning.  She is currently asymptomatic with stable vitals other than heart rate, which is currently trending in the 130s to 140s.  Discussed with Dr. Rogue Bussing and will hold Keytruda today.  Patient agreeable to take her metoprolol and then will monitor heart rate.  If it remains elevated or if she becomes symptomatic in any way she agrees to present herself to the ER for further evaluation and management.  We will also reach out to Dr. Alveria Apley office and see if they would kindly follow-up with patient.  Hypokalemia -restart oral KCl supplementation  Back pain -secondary to known metastatic disease.  Overall, pain is stable on OxyContin/oxycodone IR.  Will refill both of these per patient's request.  PDMP reviewed  Patient RTC next week for Keytruda  Patient expressed understanding and was in agreement with this plan. She also understands that She can call clinic at any time with any questions, concerns, or complaints.   Thank you for allowing me to participate in the care of this very pleasant patient.   Time Total: 25 minutes  Visit consisted of counseling and education dealing with the complex and emotionally intense issues of symptom management in the setting of serious illness.Greater than 50%  of this time was spent counseling and  coordinating care related to the above assessment and plan.  Signed by: Altha Harm, PhD, NP-C

## 2021-03-19 ENCOUNTER — Ambulatory Visit: Payer: Medicare Other | Admitting: Radiation Oncology

## 2021-03-22 ENCOUNTER — Emergency Department: Payer: Medicare Other

## 2021-03-22 ENCOUNTER — Other Ambulatory Visit: Payer: Self-pay

## 2021-03-22 ENCOUNTER — Emergency Department
Admission: EM | Admit: 2021-03-22 | Discharge: 2021-03-23 | Disposition: A | Discharge status: Home / Self Care | Payer: Medicare Other | Attending: Emergency Medicine | Admitting: Emergency Medicine

## 2021-03-22 ENCOUNTER — Encounter: Payer: Self-pay | Admitting: Emergency Medicine

## 2021-03-22 DIAGNOSIS — G8929 Other chronic pain: Secondary | ICD-10-CM | POA: Insufficient documentation

## 2021-03-22 DIAGNOSIS — Z85118 Personal history of other malignant neoplasm of bronchus and lung: Secondary | ICD-10-CM | POA: Insufficient documentation

## 2021-03-22 DIAGNOSIS — M549 Dorsalgia, unspecified: Secondary | ICD-10-CM | POA: Diagnosis not present

## 2021-03-22 DIAGNOSIS — Z20822 Contact with and (suspected) exposure to covid-19: Secondary | ICD-10-CM | POA: Insufficient documentation

## 2021-03-22 DIAGNOSIS — N179 Acute kidney failure, unspecified: Secondary | ICD-10-CM

## 2021-03-22 DIAGNOSIS — Z87891 Personal history of nicotine dependence: Secondary | ICD-10-CM | POA: Diagnosis not present

## 2021-03-22 DIAGNOSIS — I4891 Unspecified atrial fibrillation: Secondary | ICD-10-CM | POA: Diagnosis not present

## 2021-03-22 DIAGNOSIS — I2510 Atherosclerotic heart disease of native coronary artery without angina pectoris: Secondary | ICD-10-CM | POA: Diagnosis not present

## 2021-03-22 DIAGNOSIS — Z853 Personal history of malignant neoplasm of breast: Secondary | ICD-10-CM | POA: Insufficient documentation

## 2021-03-22 DIAGNOSIS — R0602 Shortness of breath: Secondary | ICD-10-CM | POA: Diagnosis present

## 2021-03-22 LAB — COMPREHENSIVE METABOLIC PANEL
ALT: 23 U/L (ref 0–44)
AST: 39 U/L (ref 15–41)
Albumin: 3.2 g/dL — ABNORMAL LOW (ref 3.5–5.0)
Alkaline Phosphatase: 68 U/L (ref 38–126)
Anion gap: 10 (ref 5–15)
BUN: 30 mg/dL — ABNORMAL HIGH (ref 8–23)
CO2: 28 mmol/L (ref 22–32)
Calcium: 8.7 mg/dL — ABNORMAL LOW (ref 8.9–10.3)
Chloride: 92 mmol/L — ABNORMAL LOW (ref 98–111)
Creatinine, Ser: 1.36 mg/dL — ABNORMAL HIGH (ref 0.44–1.00)
GFR, Estimated: 39 mL/min — ABNORMAL LOW (ref >60)
Glucose, Bld: 102 mg/dL — ABNORMAL HIGH (ref 70–99)
Potassium: 3.8 mmol/L (ref 3.5–5.1)
Sodium: 130 mmol/L — ABNORMAL LOW (ref 135–145)
Total Bilirubin: 1.1 mg/dL (ref 0.3–1.2)
Total Protein: 6.6 g/dL (ref 6.5–8.1)

## 2021-03-22 LAB — CBC
HCT: 36.9 % (ref 36.0–46.0)
Hemoglobin: 12.3 g/dL (ref 12.0–15.0)
MCH: 29.7 pg (ref 26.0–34.0)
MCHC: 33.3 g/dL (ref 30.0–36.0)
MCV: 89.1 fL (ref 80.0–100.0)
Platelets: 226 10*3/uL (ref 150–400)
RBC: 4.14 MIL/uL (ref 3.87–5.11)
RDW: 14.6 % (ref 11.5–15.5)
WBC: 7.9 10*3/uL (ref 4.0–10.5)
nRBC: 0 % (ref 0.0–0.2)

## 2021-03-22 LAB — RESP PANEL BY RT-PCR (FLU A&B, COVID) ARPGX2
Influenza A by PCR: NEGATIVE
Influenza B by PCR: NEGATIVE
SARS Coronavirus 2 by RT PCR: NEGATIVE

## 2021-03-22 LAB — TROPONIN I (HIGH SENSITIVITY): Troponin I (High Sensitivity): 44 ng/L — ABNORMAL HIGH (ref <18)

## 2021-03-22 LAB — BRAIN NATRIURETIC PEPTIDE: B Natriuretic Peptide: 438.5 pg/mL — ABNORMAL HIGH (ref 0.0–100.0)

## 2021-03-22 IMAGING — DX DG CHEST 1V PORT
1 series · 1 of 1 positions shown · non-contrast
Comparison: PET CT [DATE].

CLINICAL DATA: Shortness of breath

EXAM:
PORTABLE CHEST 1 VIEW

[chest ap]
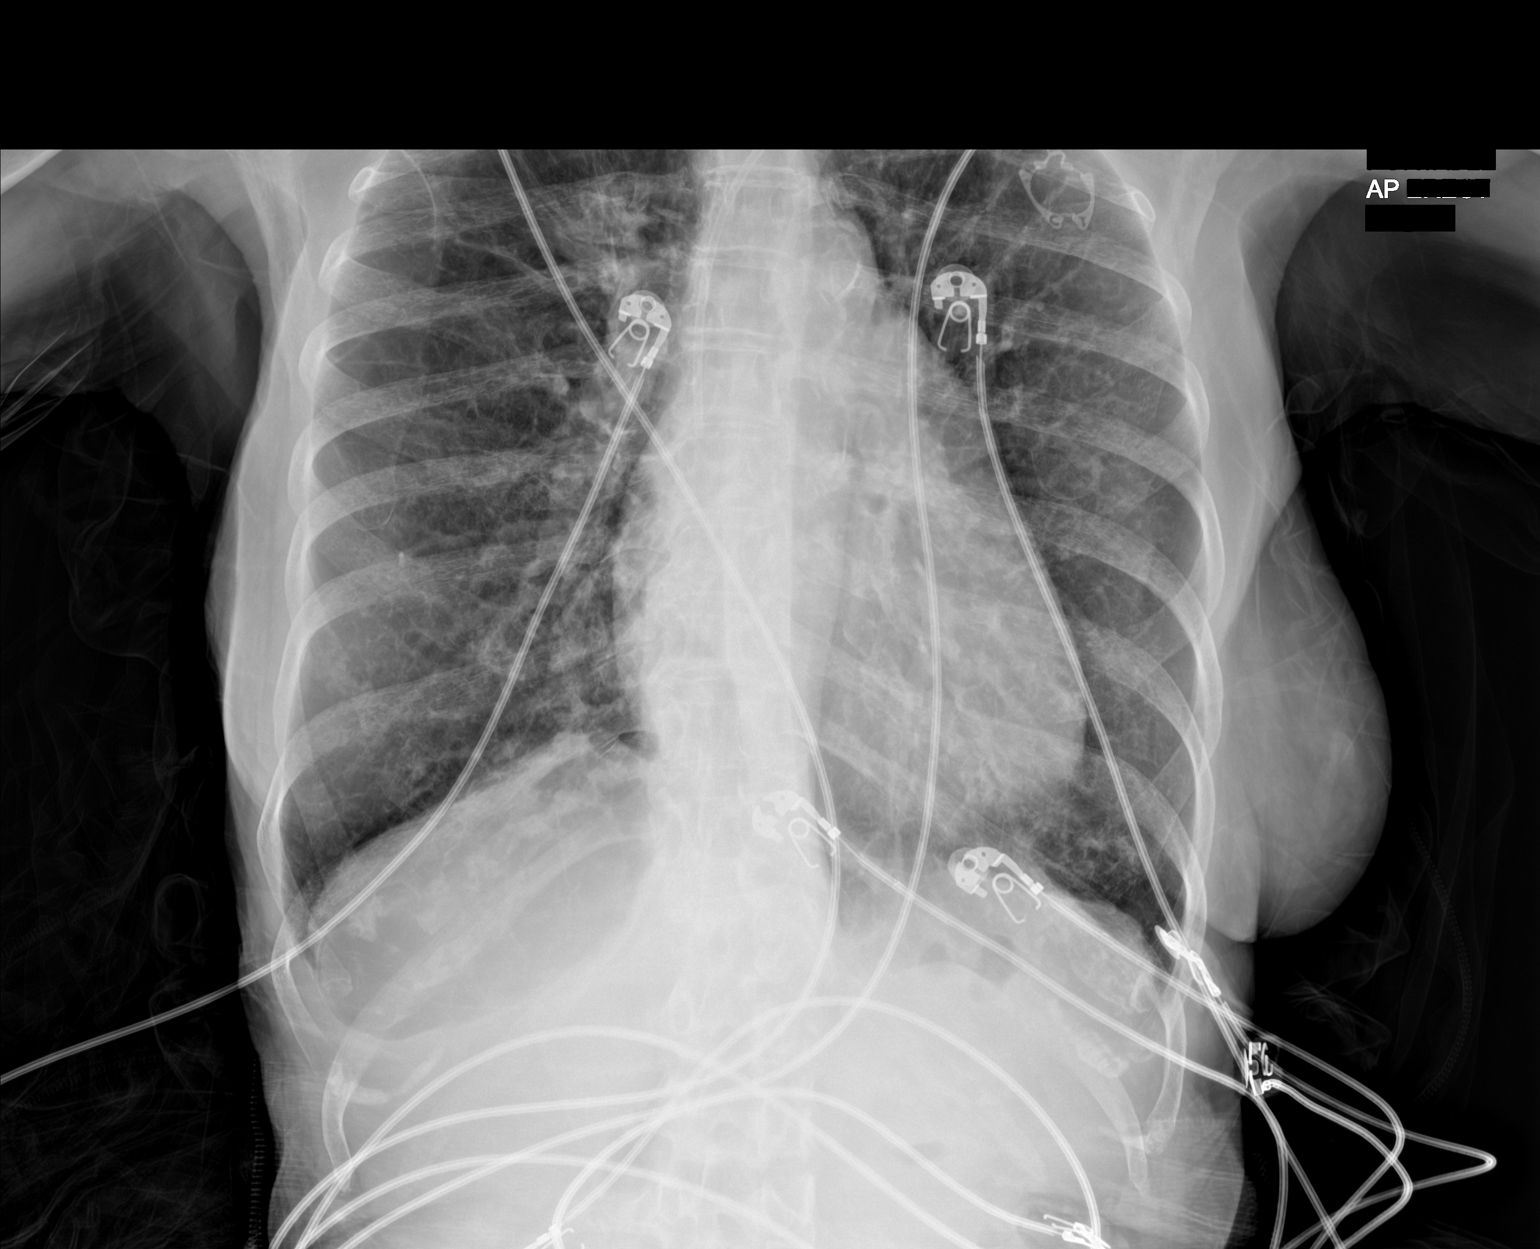

[1 of 1 positions shown; findings below may reference images not displayed]

FINDINGS: Left Port-A-Cath in place with the tip at the cavoatrial junction.
Heart is normal size. Right upper lobe nodule again noted as seen on
prior PET CT. No confluent airspace opacities. Small right effusion.
Previously seen osseous metastatic disease by PET CT not well
visualized by plain film. No visible acute bony abnormality.
IMPRESSION: Right upper lobe nodule as seen on prior PET CT. Small right pleural
effusion.

## 2021-03-22 MED ORDER — DILTIAZEM HCL 25 MG/5ML IV SOLN
10.0000 mg | Freq: Once | INTRAVENOUS | Status: AC
  Administered 2021-03-23: 10 mg via INTRAVENOUS

## 2021-03-22 MED ORDER — DILTIAZEM HCL 25 MG/5ML IV SOLN
10.0000 mg | Freq: Once | INTRAVENOUS | Status: AC
  Administered 2021-03-22: 10 mg via INTRAVENOUS
  Filled 2021-03-22: qty 5

## 2021-03-22 MED ORDER — SODIUM CHLORIDE 0.9 % IV BOLUS
1000.0000 mL | Freq: Once | INTRAVENOUS | Status: AC
  Administered 2021-03-22: 1000 mL via INTRAVENOUS

## 2021-03-22 MED ORDER — MORPHINE SULFATE (PF) 2 MG/ML IV SOLN
2.0000 mg | Freq: Once | INTRAVENOUS | Status: AC
  Administered 2021-03-22: 2 mg via INTRAVENOUS
  Filled 2021-03-22: qty 1

## 2021-03-22 NOTE — ED Provider Notes (Signed)
[facility]  ____________________________________________   Event Date/Time   First MD Initiated Contact with Patient 03/22/21 2249     (approximate)  I have reviewed the triage vital signs and the nursing notes.   HISTORY  Chief Complaint Shortness of Breath (And cancer pain)    HPI Sarah Li is a 80 y.o. female with past medical history of atrial fibrillation, metastatic lung cancer who presents with pain.  Patient tells me that she has been having increased of her chronic bone pain and has been lying in bed over the last several days.  She did not want to come to the hospital but her daughter called EMS.  She denies worsening of her chronic shortness of breath.  Pain is located primarily in her back and is the same chronic pain that she has all the time.  Did not take her oral opioids this week and because she was in bed.  Patient notably does not want to be hospitalized and would like to go home.  Has had some nausea and vomiting denies abdominal pain.  Denies chest pain.  No fevers or chills.  She did not take any of her medications over the last several days.  Patient is getting chemotherapy and is scheduled for infusion tomorrow morning at 8 AM but she would like to be home for.         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[place]) 01/20/2021   Paroxysmal atrial fibrillation ([place]) 02/12/2016   Coronary artery disease involving  native heart without angina pectoris 02/12/2016   Pure hypercholesterolemia 02/12/2016   Encounter for anticoagulation discussion and counseling 02/12/2016   History of smoking 02/12/2016   Centrilobular emphysema ([place]) 02/12/2016   Left carotid bruit 02/12/2016    Past Surgical History:  Procedure Laterality Date   ABDOMINAL HYSTERECTOMY     BREAST EXCISIONAL BIOPSY Right 1980   neg bx   BREAST EXCISIONAL BIOPSY Right 2002   breast ca radation   BREAST LUMPECTOMY     CARDIOVERSION N/A 05/10/2019   Procedure: CARDIOVERSION;  Surgeon: Corey Skains, MD;  Location: [facility] ORS;  Service: Cardiovascular;  Laterality: N/A;   [place] STUDY N/A 12/02/2015   Procedure: CARDIOVERSION;  Surgeon: Dionisio David, MD;  Location: [facility] ORS;  Service: Cardiovascular;  Laterality: N/A;   IR IMAGING GUIDED PORT INSERTION  02/18/2021   PTOSIS REPAIR Bilateral 11/29/2017   Procedure: BLEPHAROPTOSIS REPAIR RESECT EX;  Surgeon: Karle Starch, MD;  Location: [facility];  Service: Ophthalmology;  Laterality: Bilateral;    Prior to Admission medications   Medication Sig Start Date End Date Taking? Authorizing Provider  allopurinol (ZYLOPRIM) 300 MG tablet Take 300 mg by mouth daily.    [provider]  amiodarone (PACERONE) 200 MG tablet Take 1 tablet (  200 mg total) by mouth daily. 05/10/19   Corey Skains, MD  apixaban (ELIQUIS) 5 MG TABS tablet Take 1 tablet (5 mg total) by mouth 2 (two) times daily. 02/12/16   Minna Merritts, MD  calcium carbonate (TUMS - DOSED IN MG ELEMENTAL CALCIUM) 500 MG chewable tablet Chew 1 tablet by mouth daily.    [provider]  Carboxymethylcellul-Glycerin (CLEAR EYES FOR DRY EYES) 1-0.25 % SOLN Place 1 drop into both eyes daily as needed (Dry eyes).    [provider]  dexamethasone (DECADRON) 2 MG tablet Take 1 tablet (2 mg total) by mouth 2 (two) times daily. 02/27/21   Borders, Kirt Boys, NP  ezetimibe (ZETIA) 10 MG tablet Take 1 tablet (10 mg total) by mouth daily. 08/16/16 02/18/21  Minna Merritts, MD  fluticasone (FLONASE) 50 MCG/ACT nasal spray Place 2 sprays into both nostrils daily as needed for allergies or rhinitis.    [provider]  furosemide (LASIX) 20 MG tablet Take 20 mg by mouth daily as needed for fluid.     [provider]  gabapentin (NEURONTIN) 100 MG capsule Take 100 mg by mouth at bedtime. 12/29/20   [provider]  gemfibrozil (LOPID) 600 MG tablet Take 600 mg by mouth daily.     [provider]  lidocaine-prilocaine (EMLA) cream Apply 1 application topically as needed. Apply 1 application topically as needed. Apply to port and cover with saran wrap 1-2 hours prior to port access 03/02/21   Cammie Sickle, MD  metaxalone (SKELAXIN) 800 MG tablet Take by mouth. Patient not taking: Reported on 01/20/2021 07/04/19   [provider]  metoprolol succinate (TOPROL-XL) 50 MG 24 hr tablet Take 50 mg by mouth daily. 10/27/20   [provider]  naloxone (NARCAN) nasal spray 4 mg/0.1 mL SPRAY 1 SPRAY INTO ONE NOSTRIL AS DIRECTED FOR OPIOID OVERDOSE (TURN PERSON ON SIDE AFTER DOSE. IF NO RESPONSE IN 2-3 MINUTES OR PERSON RESPONDS BUT RELAPSES, REPEAT USING A NEW SPRAY DEVICE AND SPRAY INTO THE OTHER NOSTRIL. CALL 911 AFTER USE.) * EMERGENCY USE ONLY * 03/04/21   Borders, Kirt Boys, NP  ondansetron (ZOFRAN) 8 MG tablet Take 1 tablet (8 mg total) by mouth 2 (two) times daily as needed for nausea or vomiting. 03/02/21   Cammie Sickle, MD  oxyCODONE (OXYCONTIN) 20 mg 12 hr tablet Take 1 tablet (20 mg total) by mouth every 12 (twelve) hours. 03/16/21   Borders, Kirt Boys, NP  Oxycodone HCl 10 MG TABS Take 1-2 tablets (10-20 mg total) by mouth every 4 (four) hours as needed (pain). 03/16/21   Borders, Kirt Boys, NP  potassium chloride (KLOR-CON) 10 MEQ tablet Take 1 tablet (10 mEq total) by mouth daily. 03/16/21    Borders, Kirt Boys, NP  prochlorperazine (COMPAZINE) 10 MG tablet Take 1 tablet (10 mg total) by mouth every 6 (six) hours as needed for nausea or vomiting. 03/02/21   Cammie Sickle, MD  traZODone (DESYREL) 50 MG tablet Take 50 mg by mouth at bedtime.    [provider]  venlafaxine (EFFEXOR) 75 MG tablet Take 75 mg by mouth daily.     [provider]  Vitamin D, Ergocalciferol, (DRISDOL) 50000 units CAPS capsule Take 50,000 Units by mouth every 7 (seven) days. Friday 01/13/16   [provider]    Allergies Crestor [rosuvastatin calcium] and Lipitor [atorvastatin]  Family History  Problem Relation Age of Onset   Heart attack Mother  Heart attack Father    Breast cancer Sister    Heart attack Sister    Breast cancer Sister    Heart attack Sister    Heart attack Brother    Heart attack Brother     Social History Social History   Tobacco Use   Smoking status: Former    Packs/day: 1.00    Years: 30.00    Pack years: 30.00    Types: Cigarettes    Quit date: 1991    Years since quitting: 31.9   Smokeless tobacco: Former    Quit date: 12/01/1989  Vaping Use   Vaping Use: Never used  Substance Use Topics   Alcohol use: No   Drug use: No    Review of Systems   Review of Systems  Constitutional:  Negative for appetite change, chills and fever.  Respiratory:  Negative for chest tightness and shortness of breath.   Gastrointestinal:  Positive for nausea and vomiting. Negative for abdominal pain.  Musculoskeletal:  Positive for arthralgias, back pain and myalgias.  All other systems reviewed and are negative.  Physical Exam Updated Vital Signs BP 128/63 (BP Location: Right Arm)   Pulse (!) 120   Temp 99.3 F (37.4 C) (Oral)   Resp 20   Ht 5\' 2"  (1.575 m)   Wt 40.8 kg   SpO2 100%   BMI 16.46 kg/m   Physical Exam Vitals and nursing note reviewed.  Constitutional:      General: She is not in acute distress.    Appearance: Normal  appearance.     Comments: Patient appears chronically ill, very thin  HENT:     Head: Normocephalic and atraumatic.  Eyes:     General: No scleral icterus.    Conjunctiva/sclera: Conjunctivae normal.  Cardiovascular:     Rate and Rhythm: Tachycardia present. Rhythm irregular.  Pulmonary:     Effort: Pulmonary effort is normal. No respiratory distress.     Breath sounds: No stridor.  Musculoskeletal:        General: No deformity or signs of injury.     Cervical back: Normal range of motion.  Skin:    General: Skin is dry.     Coloration: Skin is not jaundiced or pale.  Neurological:     General: No focal deficit present.     Mental Status: She is alert and oriented to person, place, and time. Mental status is at baseline.  Psychiatric:        Mood and Affect: Mood normal.        Behavior: Behavior normal.     LABS (all labs ordered are listed, but only abnormal results are displayed)  Labs Reviewed  COMPREHENSIVE METABOLIC PANEL - Abnormal; Notable for the following components:      Result Value   Sodium 130 (*)    Chloride 92 (*)    Glucose, Bld 102 (*)    BUN 30 (*)    Creatinine, Ser 1.36 (*)    Calcium 8.7 (*)    Albumin 3.2 (*)    GFR, Estimated 39 (*)    All other components within normal limits  BRAIN NATRIURETIC PEPTIDE - Abnormal; Notable for the following components:   B Natriuretic Peptide 438.5 (*)    All other components within normal limits  TROPONIN I (HIGH SENSITIVITY) - Abnormal; Notable for the following components:   Troponin I (High Sensitivity) 44 (*)    All other components within normal limits  TROPONIN I (HIGH SENSITIVITY) - Abnormal;  Notable for the following components:   Troponin I (High Sensitivity) 42 (*)    All other components within normal limits  RESP PANEL BY RT-PCR (FLU A&B, COVID) ARPGX2  RESP PANEL BY RT-PCR (FLU A&B, COVID) ARPGX2  CBC  URINALYSIS, COMPLETE (UACMP) WITH MICROSCOPIC    ____________________________________________  EKG  Right axis deviation, atrial fibrillation with rapid ventricular response, no acute ischemic changes ____________________________________________  RADIOLOGY Almeta Monas, personally viewed and evaluated these images (plain radiographs) as part of my medical decision making, as well as reviewing the written report by the radiologist.  ED MD interpretation:  I reviewed the CXR which does not show any acute cardiopulmonary process-stable disease     ____________________________________________   PROCEDURES  Procedure(s) performed (including Critical Care):  .1-3 Lead EKG Interpretation Performed by: Rada Hay, MD Authorized by: Rada Hay, MD     Interpretation: abnormal     ECG rate assessment: tachycardic     Rhythm: atrial fibrillation     Ectopy: none     Conduction: normal   .Critical Care Performed by: Rada Hay, MD Authorized by: Rada Hay, MD   Critical care provider statement:    Critical care time (minutes):  75   Critical care was time spent personally by me on the following activities:  Development of treatment plan with patient or surrogate, discussions with consultants, evaluation of patient's response to treatment, examination of patient, ordering and review of laboratory studies, ordering and review of radiographic studies, ordering and performing treatments and interventions, pulse oximetry, re-evaluation of patient's condition and review of old charts   ____________________________________________   INITIAL IMPRESSION / ASSESSMENT AND PLAN / ED COURSE     Patient is a 80 year old female with 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  She is noted to be tachycardic and A. fib with RVR.  Patient notes that her pain has been worse over the last several days and she has been staying in bed not taking her pain  medicine or taking out of her medications.  She is very adamant that she does not want to be hospitalized and did not want to be transferred to the emergency department to begin with that her daughter called EMS.  She is amenable to IV fluids and rate control with goal to get her home for infusion tomorrow.  Labs were sent from triage which are notable for a troponin of 44, suspect this is in the setting of her tachycardia she has no ischemic chest pain.  She also has an AKI.  Will treat with fluids diltiazem and reassess.  After 10 mg of diltiazem patient heart rate improved but still tachycardic to the 120s and was given another 10 mg of diltiazem as well as another bolus of fluid and her 50 mg of metoprolol.  Patient's chest x-ray does not show any acute pulmonary process and her repeat troponin is stable at 42.  BNP slightly elevated 400, no history of heart failure that I can tell.  Given her ongoing tachycardia with her history of cancer and her dyspnea did obtain a CT angio which is negative for pulmonary embolism and shows rather stable disease.  Of note patient did have several episodes while in the ED where she became acutely short of breath and she was noted to be wheezing so was given duo nebs.  My ultimate recommendation was that the patient stay in the hospital given her heart rate  was not controlled and I am concerned for underlying pulmonary process driving her poorly controlled rate.  Patient did not want to stay and was very adamant that she needed to get home to see her husband and to get her appointment today.  We discussed the risks and benefits including the risk of death and the patient was able to verbalize that she understands this risk but ultimately decided to leave [place].  Clinical Course as of 03/23/21 4715  Sun Mar 22, 2021  2347 B Natriuretic Peptide(!): 438.5 [KM]    Clinical Course User Index [KM] Rada Hay, MD      ____________________________________________   FINAL CLINICAL IMPRESSION(S) / ED DIAGNOSES  Final diagnoses:  Atrial fibrillation with RVR ([place])  AKI (acute kidney injury) [facility])     ED Discharge Orders     None        Note:  This document was prepared using Dragon voice recognition software and may include unintentional dictation errors.    Rada Hay, MD 03/23/21 [phone]

## 2021-03-22 NOTE — ED Triage Notes (Signed)
Pt arrives via [facility], c/o SHOB and pain x 1 day.  Pt is stage 4 cancer in multiple organs, AxO, calm, NAD.

## 2021-03-23 ENCOUNTER — Inpatient Hospital Stay: Payer: Medicare Other

## 2021-03-23 ENCOUNTER — Inpatient Hospital Stay (HOSPITAL_BASED_OUTPATIENT_CLINIC_OR_DEPARTMENT_OTHER): Payer: Medicare Other | Admitting: Hospice and Palliative Medicine

## 2021-03-23 ENCOUNTER — Emergency Department: Payer: Medicare Other

## 2021-03-23 VITALS — BP 102/78 | HR 123 | Temp 97.1°F | Resp 20

## 2021-03-23 DIAGNOSIS — I4891 Unspecified atrial fibrillation: Secondary | ICD-10-CM | POA: Diagnosis not present

## 2021-03-23 DIAGNOSIS — J069 Acute upper respiratory infection, unspecified: Secondary | ICD-10-CM | POA: Diagnosis not present

## 2021-03-23 DIAGNOSIS — Z95828 Presence of other vascular implants and grafts: Secondary | ICD-10-CM

## 2021-03-23 DIAGNOSIS — C3411 Malignant neoplasm of upper lobe, right bronchus or lung: Secondary | ICD-10-CM | POA: Diagnosis not present

## 2021-03-23 LAB — COMPREHENSIVE METABOLIC PANEL
ALT: 25 U/L (ref 0–44)
AST: 43 U/L — ABNORMAL HIGH (ref 15–41)
Albumin: 3.2 g/dL — ABNORMAL LOW (ref 3.5–5.0)
Alkaline Phosphatase: 65 U/L (ref 38–126)
Anion gap: 16 — ABNORMAL HIGH (ref 5–15)
BUN: 28 mg/dL — ABNORMAL HIGH (ref 8–23)
CO2: 20 mmol/L — ABNORMAL LOW (ref 22–32)
Calcium: 8.1 mg/dL — ABNORMAL LOW (ref 8.9–10.3)
Chloride: 94 mmol/L — ABNORMAL LOW (ref 98–111)
Creatinine, Ser: 1.38 mg/dL — ABNORMAL HIGH (ref 0.44–1.00)
GFR, Estimated: 39 mL/min — ABNORMAL LOW (ref >60)
Glucose, Bld: 157 mg/dL — ABNORMAL HIGH (ref 70–99)
Potassium: 3.6 mmol/L (ref 3.5–5.1)
Sodium: 130 mmol/L — ABNORMAL LOW (ref 135–145)
Total Bilirubin: 0.7 mg/dL (ref 0.3–1.2)
Total Protein: 6.4 g/dL — ABNORMAL LOW (ref 6.5–8.1)

## 2021-03-23 LAB — TROPONIN I (HIGH SENSITIVITY): Troponin I (High Sensitivity): 42 ng/L — ABNORMAL HIGH (ref <18)

## 2021-03-23 IMAGING — CT CT ANGIO CHEST
2 of 7 series · 18 of 46 positions shown · IV contrast (APPLIED)
Comparison: PET-CT [DATE] and earlier.

CLINICAL DATA: 80-year-old female with shortness of breath and pain
for 1 day. Stage IV metastatic disease, unknown primary.

EXAM:
CT ANGIOGRAPHY CHEST WITH CONTRAST
TECHNIQUE: Multidetector CT imaging of the chest was performed using the
standard protocol during bolus administration of intravenous
contrast. Multiplanar CT image reconstructions and MIPs were
obtained to evaluate the vascular anatomy.
CONTRAST:  75mL OMNIPAQUE IOHEXOL 350 MG/ML SOLN

[Series 5: thins · axial · 0.60mm/px · z∈[-655,-362]mm · 15 of 408 slices shown]
[im 21/408  lung]
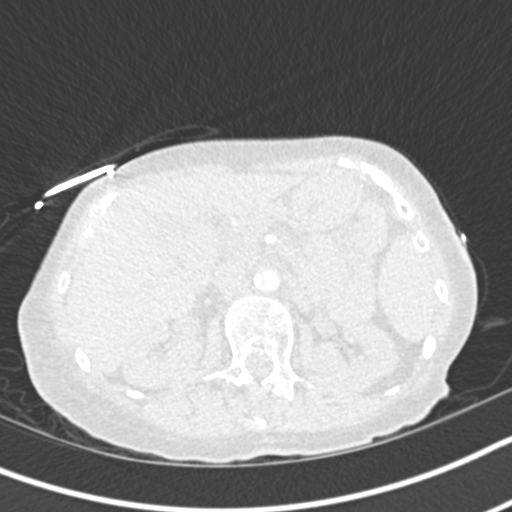
[im 41/408  soft-tissue]
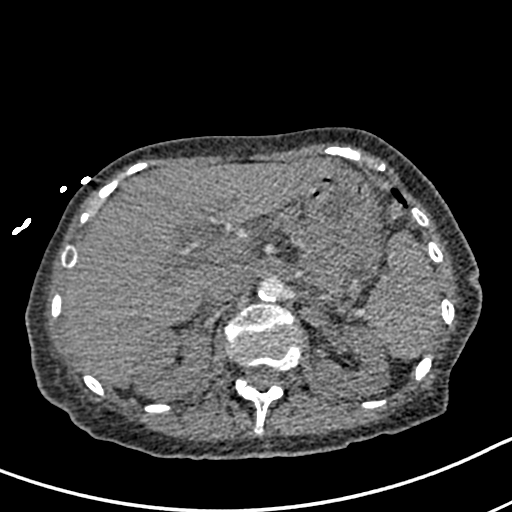
[im 82/408  lung]
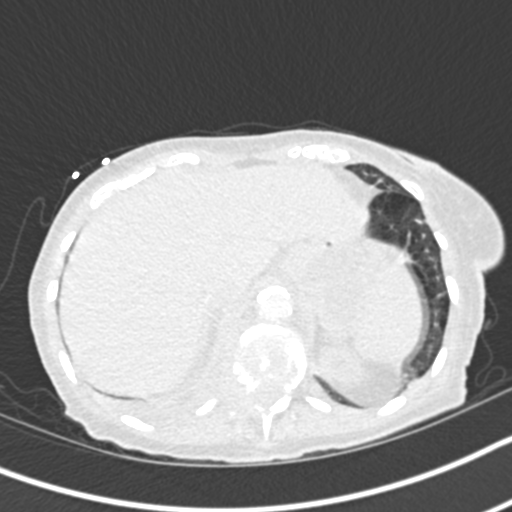
[im 102/408  soft-tissue]
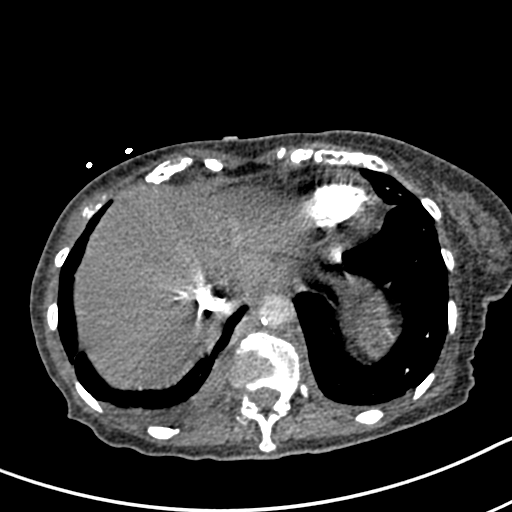
[im 123/408  lung]
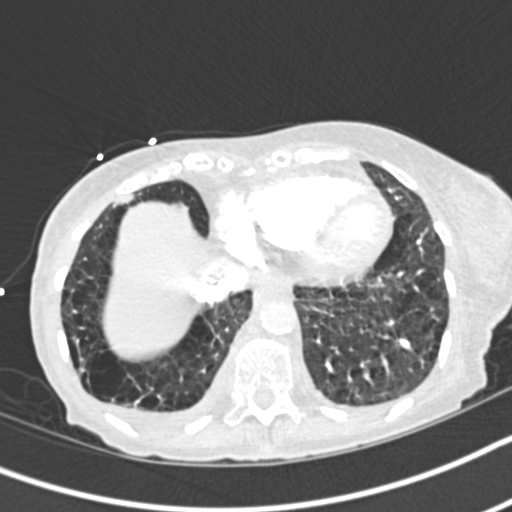
[im 143/408  soft-tissue]
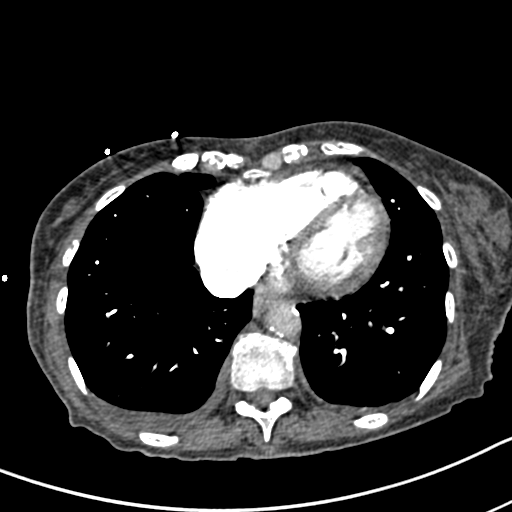
[im 184/408  lung]
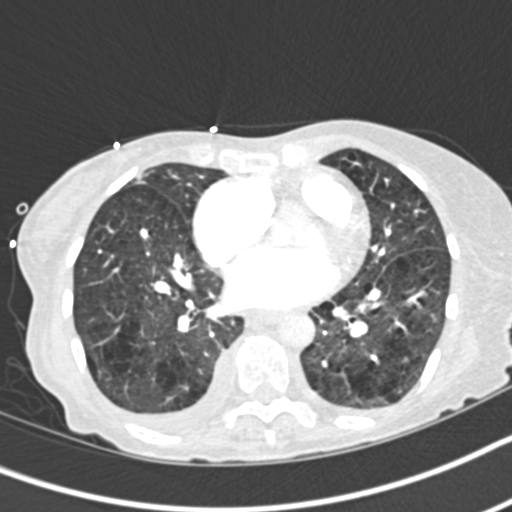
[im 204/408  soft-tissue]
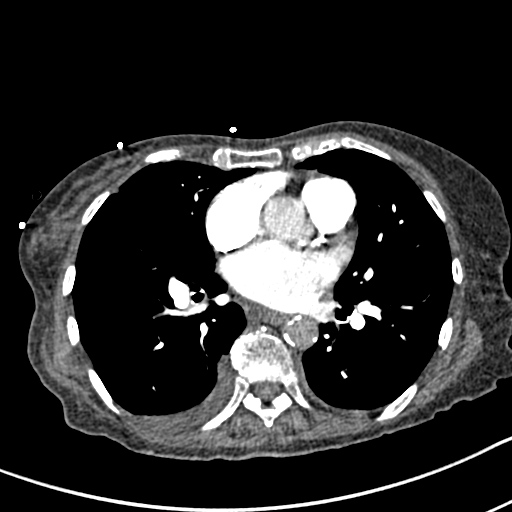
[im 224/408  lung]
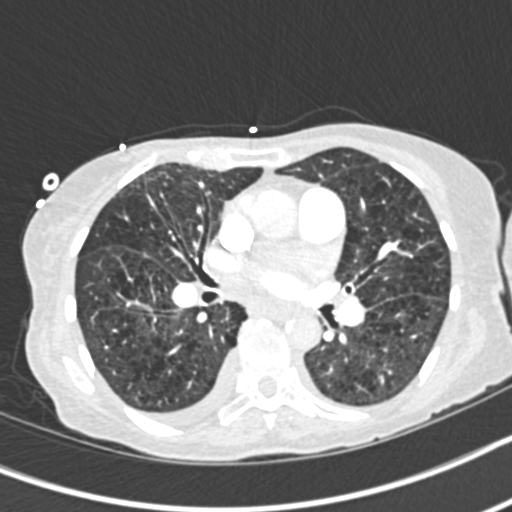
[im 265/408  soft-tissue]
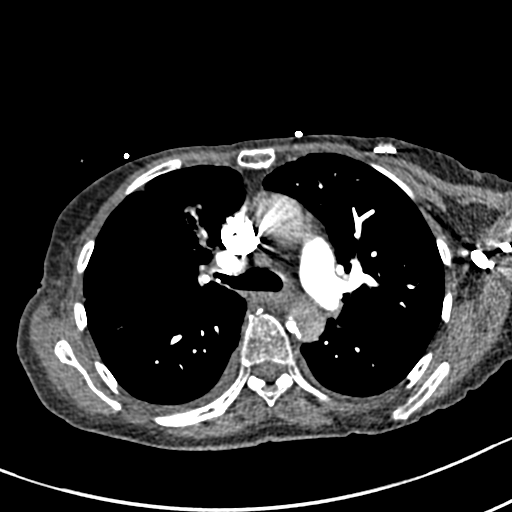
[im 285/408  lung]
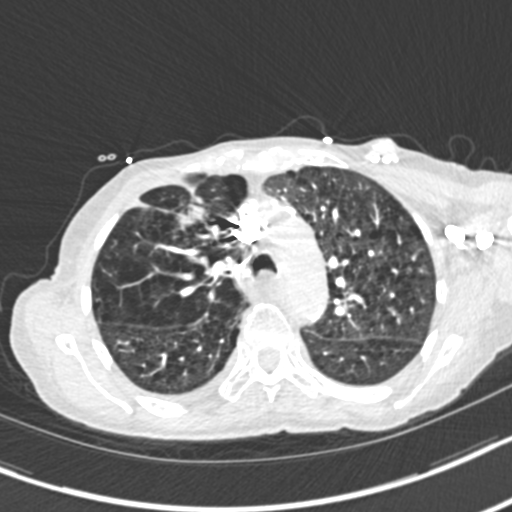
[im 306/408  soft-tissue]
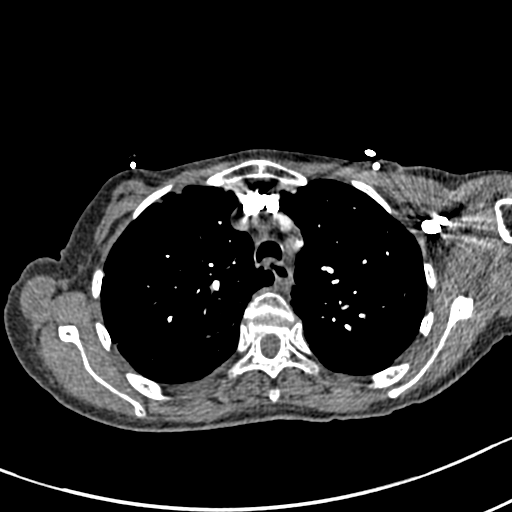
[im 326/408  lung]
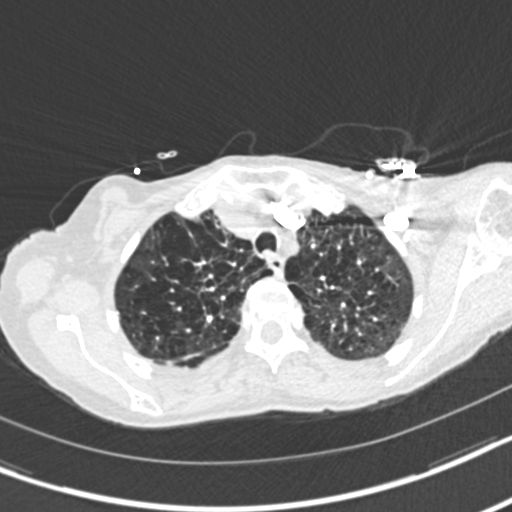
[im 367/408  soft-tissue]
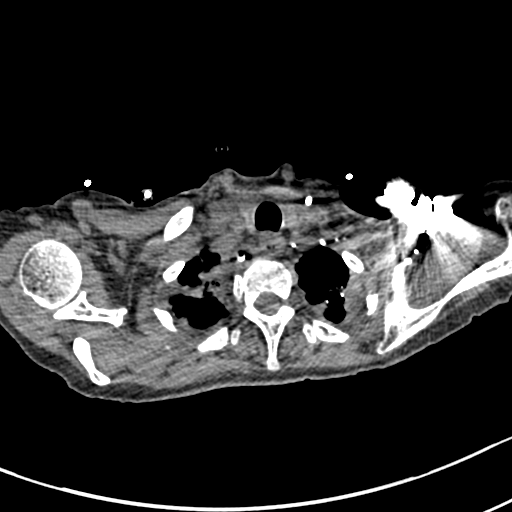
[im 387/408  lung]
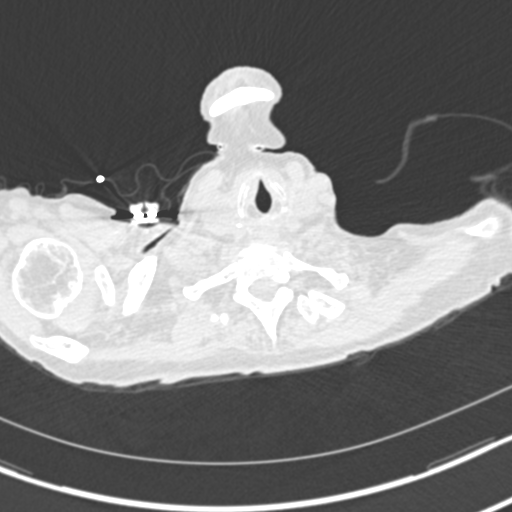

[Series 7: coronal mpr · coronal · 0.66mm/px · 3 of 73 slices shown]
[im 19/73  soft-tissue]
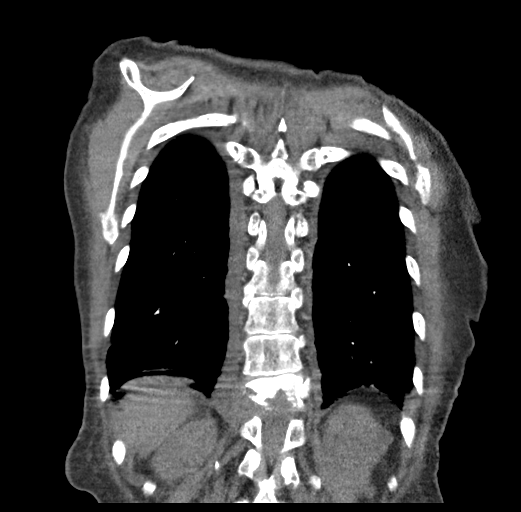
[im 37/73  soft-tissue]
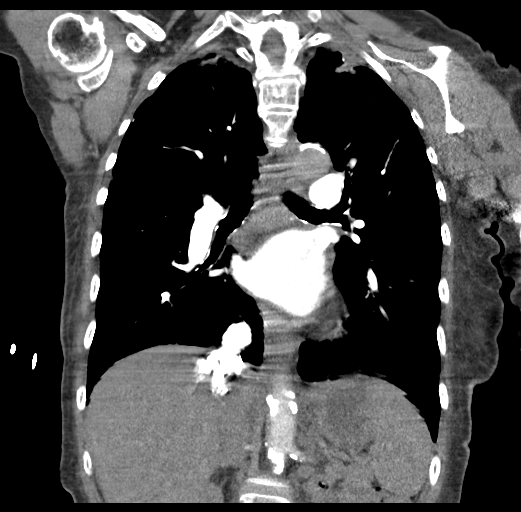
[im 55/73  soft-tissue]
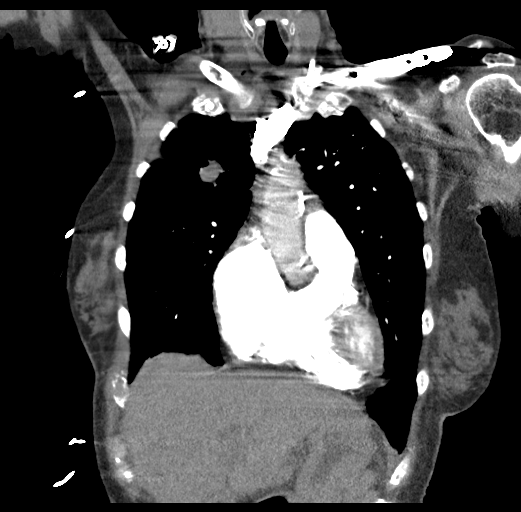

[18 of 46 positions shown; findings below may reference images not displayed]

FINDINGS: Cardiovascular: Excellent contrast bolus timing in the pulmonary
arterial tree.

No focal filling defect identified in the pulmonary arteries to
suggest acute pulmonary embolism.

Little to no contrast in the aorta. Calcified aortic
atherosclerosis. Borderline to mild cardiomegaly, right heart
enlargement. No pericardial effusion. Left chest IJ approach
Port-A-Cath.

Mediastinum/Nodes: Stable mediastinum from the PET-CT last month. No
definite mediastinal mass or lymphadenopathy.

Lungs/Pleura: Small layering pleural effusions, trace on the left.
The right pleural fluid is stable to decreased from last month.
Right upper lobe hypermetabolic lung mass measuring about 23 mm long
axis when including some curvilinear architectural distortion along
its posterior margin is stable from last month. Severe underlying
emphysema. Stable left apical lung scarring. Atelectatic changes to
the major airways. No new pulmonary opacity.

Upper Abdomen: Grossly stable liver, gallbladder, spleen, pancreas,
adrenal glands, kidneys, and bowel in the visible upper abdomen.

Musculoskeletal: T12 pathologic compression fracture appears not
significantly changed, with destruction of the right T12 posterior
elements and costovertebral junction. Mild T11 superior endplate
deformity is stable. Anterior right 7th rib metastasis with
destruction appears stable. Left clavicle ORIF redemonstrated. No
new bone metastasis identified.

Review of the MIP images confirms the above findings.
IMPRESSION: 1. Negative for acute pulmonary embolus.

2. Stable malignant findings in the chest from the PET-CT CT last
month:
- roughly 2 cm right upper lobe lung mass.
- destructive right anterior 7th rib and T12
vertebral/costovertebral metastases.
- small layering pleural effusions, slightly decreased on the right.

3. Underlying Emphysema ([B0]-[B0]). Aortic Atherosclerosis
([B0]-[B0]).

## 2021-03-23 MED ORDER — IOHEXOL 350 MG/ML SOLN
75.0000 mL | Freq: Once | INTRAVENOUS | Status: AC | PRN
  Administered 2021-03-23: 75 mL via INTRAVENOUS

## 2021-03-23 MED ORDER — IPRATROPIUM-ALBUTEROL 0.5-2.5 (3) MG/3ML IN SOLN
3.0000 mL | Freq: Once | RESPIRATORY_TRACT | Status: AC
  Administered 2021-03-23: 3 mL via RESPIRATORY_TRACT
  Filled 2021-03-23: qty 3

## 2021-03-23 MED ORDER — LEVALBUTEROL TARTRATE 45 MCG/ACT IN AERO: 1.0000 | INHALATION_SPRAY | Freq: Three times a day (TID) | RESPIRATORY_TRACT | 12 refills | Status: DC | PRN

## 2021-03-23 MED ORDER — SODIUM CHLORIDE 0.9% FLUSH
10.0000 mL | Freq: Once | INTRAVENOUS | Status: AC
  Administered 2021-03-23: 10 mL via INTRAVENOUS
  Filled 2021-03-23: qty 10

## 2021-03-23 MED ORDER — METOPROLOL TARTRATE 50 MG PO TABS
50.0000 mg | ORAL_TABLET | Freq: Once | ORAL | Status: AC
  Administered 2021-03-23: 50 mg via ORAL
  Filled 2021-03-23: qty 1

## 2021-03-23 MED ORDER — OXYCODONE HCL ER 10 MG PO T12A
20.0000 mg | EXTENDED_RELEASE_TABLET | Freq: Once | ORAL | Status: AC
  Administered 2021-03-23: 20 mg via ORAL
  Filled 2021-03-23: qty 2

## 2021-03-23 MED ORDER — SODIUM CHLORIDE 0.9 % IV BOLUS
1000.0000 mL | Freq: Once | INTRAVENOUS | Status: AC
  Administered 2021-03-23: 1000 mL via INTRAVENOUS

## 2021-03-23 MED ORDER — HEPARIN SOD (PORK) LOCK FLUSH 100 UNIT/ML IV SOLN
500.0000 [IU] | Freq: Once | INTRAVENOUS | Status: AC
  Administered 2021-03-23: 500 [IU] via INTRAVENOUS
  Filled 2021-03-23: qty 5

## 2021-03-23 NOTE — ED Notes (Signed)
Pt taken off 2L [place] and O2 sats are 99% RA at this time.

## 2021-03-23 NOTE — ED Notes (Addendum)
Upon assisting pt to move up in bed, pt visibly had trouble breathing, O2 sat monitor was not reading at this time. Pt stated SHOB. This RN put her in 4L O2, got sensor back to working and pt recovered to 100% O2 sat.  Pt was decreased to 2L and continued to maintain 100% sat reading and stated that she was no longer SHOB. EDP notified.

## 2021-03-23 NOTE — ED Notes (Signed)
This RN brought pt back from CT.  While in CT, tech called this RN and reported pt had incidence of resp distress and wheezing when moved from bed to CT table.  Upon my assessment in CT, pt had recovered, added 2LNC and pt stabilized.  Pt was able to complete CT without further incident. O2sat was 100% on [facility] upon assessment when back in ED room.  Took pt off 2L and back on RA, current sat is 93. EDP notified.

## 2021-03-23 NOTE — ED Notes (Signed)
Pt stated SHOB and audible wheezing heard.  Placed back on [facility] and EDP notified. EDP in room at this time.

## 2021-03-23 NOTE — ED Notes (Signed)
Pt pacing in room at this time.

## 2021-03-23 NOTE — ED Notes (Signed)
Pt insisting to this RN she is going home, tearing off all monitors at this time.  EDP made aware.

## 2021-03-23 NOTE — ED Notes (Signed)
This RN asked pt if I could call daughter Claiborne Billings.  Pt agreed, argued with daughter and then hung up on her and become irate with ED staff. Pt then started pulling off monitor wires and insisted IV be taken out at this time.  EDP and 3 Rns in room at this time.  EDP educated pt on reasons that she should stay.  PT verbalized understanding and insisted on leaving. This RN called pt daughter and requested pick up. Daughter agreed.

## 2021-03-23 NOTE — ED Notes (Signed)
Pt in bathroom at this time, stated that she needs to fix her hair and lipstick.

## 2021-03-23 NOTE — ED Notes (Signed)
Pt sitting on bench in room at this time, calm and expressed desire to go home and take a shower.

## 2021-03-23 NOTE — Progress Notes (Signed)
Pt c/o shortness of breath this AM and lower back pain. States that she was in the ED last night.

## 2021-03-23 NOTE — ED Notes (Signed)
Pt taken to CT at this time.

## 2021-03-23 NOTE — Progress Notes (Signed)
Symptom Management [place] at [facility] [phone] [phone]  Patient Care Team: Idelle Crouch, MD as PCP - General (Internal Medicine) Minna Merritts, MD as Consulting Physician (Cardiology) Cammie Sickle, MD as Consulting Physician (Hematology and Oncology)   Name of the patient: Sarah Li  [id number]  [birth year]   Date of visit: 03/23/21  Reason for Consult:  Sarah Li is a 80 y.o. 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  Patient has T12 and right seventh rib osseous metastasis status post XRT.  She has had severe back pain and is followed by palliative care for symptom management.  Patient was scheduled for Keytruda last week but this was delayed due to tachycardia.  She was found to have A. fib with RVR and was sent to her cardiologist.  Patient subsequently had a Holter monitor for 3 days.  She continues on beta-blocker.  Patient was seen yesterday in ER for shortness of breath and cancer pain.  She had a negative work-up with CTA, which did not show PE.  Lung cancer appears stable.  Patient ultimately left AMA.  She presents to clinic today for [facility] but says that she does not want treatment today.  She continues to endorse shortness of breath and wheezing with sore throat and some nasal congestion.  She asked for something for wheezing and pain.  Patient says that she is not interested in returning to the ER for further evaluation.  Patient is on chronic anticoagulation with Eliquis.  She is on beta-blocker for heart rate.  Denies any neurologic complaints. Denies recent fevers or illnesses. Denies any easy bleeding or bruising. Reports good appetite and denies weight loss. Denies chest pain. Denies any nausea, vomiting, constipation, or diarrhea. Denies urinary complaints. Patient offers no further specific complaints  today.  PAST MEDICAL HISTORY: Past Medical History:  Diagnosis Date   Atrial fibrillation ([place])    Breast cancer ([place]) 2002   positive   Carotid bruit    Coronary artery disease    Dental bridge present    lower left - permanent   Depression    GERD (gastroesophageal reflux disease)    Gout    Hyperlipidemia    Mitral valve regurgitation    Personal history of radiation therapy    Raynaud's disease    Wolff-Parkinson-White syndrome     PAST SURGICAL HISTORY:  Past Surgical History:  Procedure Laterality Date   ABDOMINAL HYSTERECTOMY     BREAST EXCISIONAL BIOPSY Right 1980   neg bx   BREAST EXCISIONAL BIOPSY Right 2002   breast ca radation   BREAST LUMPECTOMY     CARDIOVERSION N/A 05/10/2019   Procedure: CARDIOVERSION;  Surgeon: Corey Skains, MD;  Location: [facility] ORS;  Service: Cardiovascular;  Laterality: N/A;   [place] STUDY N/A 12/02/2015   Procedure: CARDIOVERSION;  Surgeon: Dionisio David, MD;  Location: [facility] ORS;  Service: Cardiovascular;  Laterality: N/A;   IR IMAGING GUIDED PORT INSERTION  02/18/2021   PTOSIS REPAIR Bilateral 11/29/2017   Procedure: BLEPHAROPTOSIS REPAIR RESECT EX;  Surgeon: Karle Starch, MD;  Location: [place];  Service: Ophthalmology;  Laterality: Bilateral;    HEMATOLOGY/ONCOLOGY HISTORY:  Oncology History Overview Note  DIAGNOSIS:  A. BONE, T12 VERTEBRAL BODY LESION; BIOPSY:  - METASTATIC SQUAMOUS CELL CARCINOMA.  - SEE COMMENT   1. No acute findings or  explanation for the patient's symptoms. No evidence of urinary tract calculus, hydronephrosis or bowel wall thickening. 2. Indeterminate sclerotic lesion in the right aspect of the T12 vertebral body. Given the patient's history, this could reflect metastatic disease (potentially treated). No other signs of metastatic breast cancer. Consider further assessment with whole-body bone scan or thoracic MRI. 3. Left renal  cysts.   # s/p palliative radiation to the T12 lesion [11/04]; awaiting  RT  right upper lobe nodule-start next week [until 11/21].  # week of 11/28- Keytruda [TPS-1%]     # Melanoma of right cheek- s/p [15-20 years ago]   # right lumpectomy- Breast cancer [Dr.Crystal s/p RT; pills x 5 years- 2002]   Cancer, metastatic to bone ([place])  01/20/2021 Initial Diagnosis   Cancer, metastatic to bone ([place])   03/23/2021 -  Chemotherapy   Patient is on Treatment Plan : LUNG NSCLC flat dose Pembrolizumab Q21D     Primary cancer of right upper lobe of lung ([place])  02/19/2021 Initial Diagnosis   Primary cancer of right upper lobe of lung ([place])   02/19/2021 Cancer Staging   Staging form: Lung, AJCC 8th Edition - Clinical: Stage IVA (cT2, cN0, cM1b) - Signed by Cammie Sickle, MD on 02/19/2021     ALLERGIES:  is allergic to crestor [rosuvastatin calcium] and lipitor [atorvastatin].  MEDICATIONS:  Current Outpatient Medications  Medication Sig Dispense Refill   allopurinol (ZYLOPRIM) 300 MG tablet Take 300 mg by mouth daily.     amiodarone (PACERONE) 200 MG tablet Take 1 tablet (200 mg total) by mouth daily. 30 tablet 6   apixaban (ELIQUIS) 5 MG TABS tablet Take 1 tablet (5 mg total) by mouth 2 (two) times daily. 60 tablet 11   calcium carbonate (TUMS - DOSED IN MG ELEMENTAL CALCIUM) 500 MG chewable tablet Chew 1 tablet by mouth daily.     Carboxymethylcellul-Glycerin (CLEAR EYES FOR DRY EYES) 1-0.25 % SOLN Place 1 drop into both eyes daily as needed (Dry eyes).     dexamethasone (DECADRON) 2 MG tablet Take 1 tablet (2 mg total) by mouth 2 (two) times daily. 20 tablet 0   ezetimibe (ZETIA) 10 MG tablet Take 1 tablet (10 mg total) by mouth daily. 90 tablet 3   fluticasone (FLONASE) 50 MCG/ACT nasal spray Place 2 sprays into both nostrils daily as needed for allergies or rhinitis.     furosemide (LASIX) 20 MG tablet Take 20 mg by mouth daily as needed for fluid.      gabapentin  (NEURONTIN) 100 MG capsule Take 100 mg by mouth at bedtime.     gemfibrozil (LOPID) 600 MG tablet Take 600 mg by mouth daily.      lidocaine-prilocaine (EMLA) cream Apply 1 application topically as needed. Apply 1 application topically as needed. Apply to port and cover with saran wrap 1-2 hours prior to port access 30 g 0   metaxalone (SKELAXIN) 800 MG tablet Take by mouth. (Patient not taking: Reported on 01/20/2021)     metoprolol succinate (TOPROL-XL) 50 MG 24 hr tablet Take 50 mg by mouth daily.     naloxone (NARCAN) nasal spray 4 mg/0.1 mL SPRAY 1 SPRAY INTO ONE NOSTRIL AS DIRECTED FOR OPIOID OVERDOSE (TURN PERSON ON SIDE AFTER DOSE. IF NO RESPONSE IN 2-3 MINUTES OR PERSON RESPONDS BUT RELAPSES, REPEAT USING A NEW SPRAY DEVICE AND SPRAY INTO THE OTHER NOSTRIL. CALL 911 AFTER USE.) * EMERGENCY USE ONLY * 1 each 0   ondansetron (ZOFRAN) 8 MG tablet  Take 1 tablet (8 mg total) by mouth 2 (two) times daily as needed for nausea or vomiting. 30 tablet 1   oxyCODONE (OXYCONTIN) 20 mg 12 hr tablet Take 1 tablet (20 mg total) by mouth every 12 (twelve) hours. 60 tablet 0   Oxycodone HCl 10 MG TABS Take 1-2 tablets (10-20 mg total) by mouth every 4 (four) hours as needed (pain). 90 tablet 0   potassium chloride (KLOR-CON) 10 MEQ tablet Take 1 tablet (10 mEq total) by mouth daily. 15 tablet 0   prochlorperazine (COMPAZINE) 10 MG tablet Take 1 tablet (10 mg total) by mouth every 6 (six) hours as needed for nausea or vomiting. 30 tablet 1   traZODone (DESYREL) 50 MG tablet Take 50 mg by mouth at bedtime.     venlafaxine (EFFEXOR) 75 MG tablet Take 75 mg by mouth daily.      Vitamin D, Ergocalciferol, (DRISDOL) 50000 units CAPS capsule Take 50,000 Units by mouth every 7 (seven) days. Friday     No current facility-administered medications for this visit.    VITAL SIGNS: BP 102/78   Pulse (!) 123   Temp (!) 97.1 F (36.2 C) (Tympanic)   Resp 20  There were no vitals filed for this visit.  Estimated  body mass index is 16.46 kg/m as calculated from the following:   Height as of 03/22/21: 5\' 2"  (1.575 m).   Weight as of 03/22/21: 90 lb (40.8 kg).  LABS: CBC:    Component Value Date/Time   WBC 7.9 03/22/2021 2218   HGB 12.3 03/22/2021 2218   HGB 11.0 (L) 10/21/2012 0339   HCT 36.9 03/22/2021 2218   HCT 33.1 (L) 10/21/2012 0339   PLT 226 03/22/2021 2218   PLT 289 10/21/2012 0339   MCV 89.1 03/22/2021 2218   MCV 94 10/21/2012 0339   NEUTROABS 10.0 (H) 03/16/2021 1009   NEUTROABS 7.8 (H) 10/21/2012 0339   LYMPHSABS 0.2 (L) 03/16/2021 1009   LYMPHSABS 0.5 (L) 10/21/2012 0339   MONOABS 0.5 03/16/2021 1009   MONOABS 0.5 10/21/2012 0339   EOSABS 0.0 03/16/2021 1009   EOSABS 0.0 10/21/2012 0339   BASOSABS 0.0 03/16/2021 1009   BASOSABS 0.0 10/21/2012 0339   Comprehensive Metabolic Panel:    Component Value Date/Time   NA 130 (L) 03/23/2021 1034   NA 141 10/21/2012 0339   K 3.6 03/23/2021 1034   K 4.7 10/21/2012 0339   CL 94 (L) 03/23/2021 1034   CL 112 (H) 10/21/2012 0339   CO2 20 (L) 03/23/2021 1034   CO2 22 10/21/2012 0339   BUN 28 (H) 03/23/2021 1034   BUN 20 (H) 10/21/2012 0339   CREATININE 1.38 (H) 03/23/2021 1034   CREATININE 0.91 10/21/2012 0339   GLUCOSE 157 (H) 03/23/2021 1034   GLUCOSE 125 (H) 10/21/2012 0339   CALCIUM 8.1 (L) 03/23/2021 1034   CALCIUM 8.3 (L) 10/21/2012 0339   AST 43 (H) 03/23/2021 1034   AST 82 (H) 10/18/2012 0855   ALT 25 03/23/2021 1034   ALT 37 10/18/2012 0855   ALKPHOS 65 03/23/2021 1034   ALKPHOS 96 10/18/2012 0855   BILITOT 0.7 03/23/2021 1034   BILITOT 0.7 10/18/2012 0855   PROT 6.4 (L) 03/23/2021 1034   PROT 7.6 10/18/2012 0855   ALBUMIN 3.2 (L) 03/23/2021 1034   ALBUMIN 3.8 10/18/2012 0855    RADIOGRAPHIC STUDIES: CT Angio Chest PE W and/or Wo Contrast  Result Date: 03/23/2021 CLINICAL DATA:  80 year old female with shortness of breath and pain  for 1 day. Stage IV metastatic disease, unknown primary. EXAM: CT  ANGIOGRAPHY CHEST WITH CONTRAST TECHNIQUE: Multidetector CT imaging of the chest was performed using the standard protocol during bolus administration of intravenous contrast. Multiplanar CT image reconstructions and MIPs were obtained to evaluate the vascular anatomy. CONTRAST:  74mL OMNIPAQUE IOHEXOL 350 MG/ML SOLN COMPARISON:  PET-CT 03/04/2021 and earlier. FINDINGS: Cardiovascular: Excellent contrast bolus timing in the pulmonary arterial tree. No focal filling defect identified in the pulmonary arteries to suggest acute pulmonary embolism. Little to no contrast in the aorta. Calcified aortic atherosclerosis. Borderline to mild cardiomegaly, right heart enlargement. No pericardial effusion. Left chest IJ approach Port-A-Cath. Mediastinum/Nodes: Stable mediastinum from the PET-CT last month. No definite mediastinal mass or lymphadenopathy. Lungs/Pleura: Small layering pleural effusions, trace on the left. The right pleural fluid is stable to decreased from last month. Right upper lobe hypermetabolic lung mass measuring about 23 mm long axis when including some curvilinear architectural distortion along its posterior margin is stable from last month. Severe underlying emphysema. Stable left apical lung scarring. Atelectatic changes to the major airways. No new pulmonary opacity. Upper Abdomen: Grossly stable liver, gallbladder, spleen, pancreas, adrenal glands, kidneys, and bowel in the visible upper abdomen. Musculoskeletal: T12 pathologic compression fracture appears not significantly changed, with destruction of the right T12 posterior elements and costovertebral junction. Mild T11 superior endplate deformity is stable. Anterior right 7th rib metastasis with destruction appears stable. Left clavicle ORIF redemonstrated. No new bone metastasis identified. Review of the MIP images confirms the above findings. IMPRESSION: 1. Negative for acute pulmonary embolus. 2. Stable malignant findings in the chest from the  PET-CT CT last month: - roughly 2 cm right upper lobe lung mass. - destructive right anterior 7th rib and T12 vertebral/costovertebral metastases. - small layering pleural effusions, slightly decreased on the right. 3. Underlying Emphysema (ICD10-J43.9). Aortic Atherosclerosis (ICD10-I70.0). Electronically Signed   By: Sarah Ann M.D.   On: 03/23/2021 04:47   NM PET Image Initial (PI) Skull Base To Thigh (F-18 FDG)  Result Date: 03/04/2021 CLINICAL DATA:  Subsequent treatment strategy for metastatic cancer of unknown primary. Radiation and steroid therapy. Planned Keytruda therapy. EXAM: NUCLEAR MEDICINE PET SKULL BASE TO THIGH TECHNIQUE: 5.57 mCi F-18 FDG was injected intravenously. Full-ring PET imaging was performed from the skull base to thigh after the radiotracer. CT data was obtained and used for attenuation correction and anatomic localization. Fasting blood glucose: 84 mg/dl COMPARISON:  PET-CT 01/22/2021 FINDINGS: Mediastinal blood pool activity: SUV max 1.8 Liver activity: SUV max 2.1 NECK: No hypermetabolic cervical lymph nodes are identified.There are no lesions of the pharyngeal mucosal space. Incidental CT findings: Bilateral carotid atherosclerosis. CHEST: There are no hypermetabolic mediastinal, hilar or axillary lymph nodes. Anterior right upper lobe hypermetabolic nodularity is again noted with improvement in the degree of hypermetabolic activity compared with the previous study. For example, a 2.0 x 1.7 cm nodular component on image 75/3 currently has an SUV max of 2.6; previously 4.6. No new pulmonary hypermetabolic activity or suspicious nodularity. A right pleural effusion has enlarged, without hypermetabolic activity. Incidental CT findings: Moderate to severe centrilobular and paraseptal emphysema with stable biapical scarring. Left IJ Port-A-Cath extends into the superior aspect of the right atrium. There is atherosclerosis of the aorta, great vessels and coronary arteries.  ABDOMEN/PELVIS: There is no hypermetabolic activity within the liver, adrenal glands, spleen or pancreas. There is no hypermetabolic nodal activity. Incidental CT findings: Stable left renal lesions without hypermetabolic activity, likely cysts. Moderate stool throughout  the colon. Diffuse aortic and branch vessel atherosclerosis. SKELETON: Hypermetabolic, destructive mass involving the T12 vertebral body again noted. This extends into the posterior elements, asymmetric to the right, and is associated with slightly greater pathologic fracture. SUV max is currently 6.4, previously 7.6. Slightly progressive destruction of the right 7th rib anteriorly (SUV max 3.5). No definite new lesions seen. Incidental CT findings: Previous left clavicular ORIF. Stable intramuscular lipoma posterior to the left shoulder. IMPRESSION: 1. Mildly improved hypermetabolic pulmonary nodularity within the right upper lobe, consistent with response to therapy. 2. Known metastases involving the T12 vertebral body and right 7th rib are slightly progressive, although the metabolic activity within the T12 lesion has slightly improved. No new osseous metastases identified. 3. No other evidence of metastatic disease. 4. Enlarging moderate-sized right pleural effusion. 5. Aortic Atherosclerosis (ICD10-I70.0) and Emphysema (ICD10-J43.9). Electronically Signed   By: Richardean Sale M.D.   On: 03/04/2021 10:47   DG Chest Portable 1 View  Result Date: 03/23/2021 CLINICAL DATA:  80 year old female with shortness of breath and pain for 1 day. Stage IV metastatic disease, unknown primary. EXAM: PORTABLE CHEST 1 VIEW COMPARISON:  CT chest 0419 hours today and earlier. FINDINGS: Portable AP upright view at 0443 hours. Stable lung volumes and ventilation from the recent CTA chest. Stable cardiac size and mediastinal contours. Left chest Port-A-Cath. Right upper lobe lung mass, emphysema and small pleural effusions better demonstrated by CT. No  pneumothorax. Stable visualized osseous structures. T12 pathologic compression fracture better demonstrated by CT. IMPRESSION: Emphysema with right upper lobe lung mass and small pleural effusions as demonstrated by CTA this hour. Electronically Signed   By: Sarah Ann M.D.   On: 03/23/2021 04:57   DG Chest Port 1 View  Result Date: 03/22/2021 CLINICAL DATA:  Shortness of breath EXAM: PORTABLE CHEST 1 VIEW COMPARISON:  PET CT 03/04/2021. FINDINGS: Left Port-A-Cath in place with the tip at the cavoatrial junction. Heart is normal size. Right upper lobe nodule again noted as seen on prior PET CT. No confluent airspace opacities. Small right effusion. Previously seen osseous metastatic disease by PET CT not well visualized by plain film. No visible acute bony abnormality. IMPRESSION: Right upper lobe nodule as seen on prior PET CT. Small right pleural effusion. Electronically Signed   By: Rolm Baptise M.D.   On: 03/22/2021 22:47    PERFORMANCE STATUS (ECOG) : 1 - Symptomatic but completely ambulatory  Review of Systems Unless otherwise noted, a complete review of systems is negative.  Physical Exam General: NAD Cardiovascular: Tachycardic, irregularly irregular Pulmonary: clear anterior/posterior fields Abdomen: soft, nontender, + bowel sounds GU: no suprapubic tenderness Extremities: no edema, no joint deformities Skin: no rashes Neurological: Weakness but otherwise nonfocal  Assessment and Plan- Patient is a 80 y.o. 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  Patient has T12 and right seventh rib osseous metastasis status post XRT.  On single agent Keytruda.  Patient presents to [facility] for evaluation of wheezing, shortness of breath, sore throat, nasal congestion   URI -we will treat symptomatically.  Patient would like something for wheezing despite our conversation that inhalers could potentially increase heart rate.  We will  try her on Xopenex as hopefully this will not exacerbate tachycardia.  Patient to monitor heart rate at home and continue beta-blocker and follow-up with cardiology.  ER triggers discussed.    Lung cancer  - patient is not interested in [place] today and would just  like to go home and rest. She is next scheduled for the [place] on 04/07/2021 and would like to delay further treatment until this day.  Lung cancer appeared stable on CTA.  Neoplasm related pain -patient admits to not currently taking pain meds.  Recommended that she restart OxyContin and utilize oxycodone as needed for breakthrough pain  Will schedule follow-up [facility]/labs next week or see her sooner if needed.    Patient expressed understanding and was in agreement with this plan. She also understands that She can call clinic at any time with any questions, concerns, or complaints.   Thank you for allowing me to participate in the care of this very pleasant patient.   Time Total: 15 minutes  Visit consisted of counseling and education dealing with the complex and emotionally intense issues of symptom management in the setting of serious illness.Greater than 50%  of this time was spent counseling and coordinating care related to the above assessment and plan.  Signed by: Altha Harm, PhD, NP-C

## 2021-03-24 ENCOUNTER — Other Ambulatory Visit: Payer: Self-pay | Admitting: *Deleted

## 2021-03-24 MED ORDER — POTASSIUM CHLORIDE ER 10 MEQ PO TBCR: 10.0000 meq | EXTENDED_RELEASE_TABLET | Freq: Every day | ORAL | 0 refills | Status: AC

## 2021-03-24 NOTE — Telephone Encounter (Signed)
Component Ref Range & Units 1 d ago  (03/23/21) 2 d ago  (03/22/21) 8 d ago  (03/16/21) 2 wk ago  (03/09/21) 1 mo ago  (02/10/21) 2 mo ago  (01/20/21) 8 yr ago  (10/21/12)  Potassium 3.5 - 5.1 mmol/L 3.6  3.8  3.3 Low

## 2021-03-28 ENCOUNTER — Telehealth: Payer: Self-pay | Admitting: Oncology

## 2021-03-28 NOTE — Telephone Encounter (Signed)
Re-Anorexia   Patient's daughter called on-call service this morning with concerns of her mom not eating or drinking well.  States she cannot get her to eat more than 2-3 bites of food per day.  She spoke to a doctor friend who has recommended [place] Magic mouthwash to help with metallic taste that her mom continues to complain about postradiation.   She is also having trouble getting her mom to take her pain medication.  States that she has not taking any of her medicines including her cardiac medicines she was put on for atrial fibrillation.  She is scheduled to return to our clinic on Monday for a symptom management visit with Josh.  I recommend she speak with him regarding palliative care services given the symptoms she is experiencing.  Magic Mouthwash [facility]  Faythe Casa, NP 03/28/2021 8:52 AM

## 2021-03-29 ENCOUNTER — Other Ambulatory Visit: Payer: Self-pay

## 2021-03-30 ENCOUNTER — Inpatient Hospital Stay: Payer: Medicare Other

## 2021-03-30 ENCOUNTER — Other Ambulatory Visit: Payer: Self-pay

## 2021-03-30 ENCOUNTER — Inpatient Hospital Stay (HOSPITAL_BASED_OUTPATIENT_CLINIC_OR_DEPARTMENT_OTHER): Payer: Medicare Other | Admitting: Hospice and Palliative Medicine

## 2021-03-30 VITALS — BP 99/65 | HR 121 | Temp 98.2°F | Resp 18

## 2021-03-30 DIAGNOSIS — C3411 Malignant neoplasm of upper lobe, right bronchus or lung: Secondary | ICD-10-CM

## 2021-03-30 DIAGNOSIS — C7951 Secondary malignant neoplasm of bone: Secondary | ICD-10-CM

## 2021-03-30 DIAGNOSIS — Z95828 Presence of other vascular implants and grafts: Secondary | ICD-10-CM

## 2021-03-30 LAB — CBC WITH DIFFERENTIAL/PLATELET
Abs Immature Granulocytes: 0.05 10*3/uL (ref 0.00–0.07)
Basophils Absolute: 0 10*3/uL (ref 0.0–0.1)
Basophils Relative: 1 %
Eosinophils Absolute: 0.2 10*3/uL (ref 0.0–0.5)
Eosinophils Relative: 3 %
HCT: 37.2 % (ref 36.0–46.0)
Hemoglobin: 12.1 g/dL (ref 12.0–15.0)
Immature Granulocytes: 1 %
Lymphocytes Relative: 8 %
Lymphs Abs: 0.6 10*3/uL — ABNORMAL LOW (ref 0.7–4.0)
MCH: 29 pg (ref 26.0–34.0)
MCHC: 32.5 g/dL (ref 30.0–36.0)
MCV: 89.2 fL (ref 80.0–100.0)
Monocytes Absolute: 0.8 10*3/uL (ref 0.1–1.0)
Monocytes Relative: 10 %
Neutro Abs: 5.9 10*3/uL (ref 1.7–7.7)
Neutrophils Relative %: 77 %
Platelets: 315 10*3/uL (ref 150–400)
RBC: 4.17 MIL/uL (ref 3.87–5.11)
RDW: 15.8 % — ABNORMAL HIGH (ref 11.5–15.5)
WBC: 7.6 10*3/uL (ref 4.0–10.5)
nRBC: 0 % (ref 0.0–0.2)

## 2021-03-30 LAB — COMPREHENSIVE METABOLIC PANEL
ALT: 47 U/L — ABNORMAL HIGH (ref 0–44)
AST: 31 U/L (ref 15–41)
Albumin: 2.9 g/dL — ABNORMAL LOW (ref 3.5–5.0)
Alkaline Phosphatase: 72 U/L (ref 38–126)
Anion gap: 12 (ref 5–15)
BUN: 40 mg/dL — ABNORMAL HIGH (ref 8–23)
CO2: 27 mmol/L (ref 22–32)
Calcium: 8.1 mg/dL — ABNORMAL LOW (ref 8.9–10.3)
Chloride: 94 mmol/L — ABNORMAL LOW (ref 98–111)
Creatinine, Ser: 1.57 mg/dL — ABNORMAL HIGH (ref 0.44–1.00)
GFR, Estimated: 33 mL/min — ABNORMAL LOW (ref >60)
Glucose, Bld: 91 mg/dL (ref 70–99)
Potassium: 3.3 mmol/L — ABNORMAL LOW (ref 3.5–5.1)
Sodium: 133 mmol/L — ABNORMAL LOW (ref 135–145)
Total Bilirubin: 1 mg/dL (ref 0.3–1.2)
Total Protein: 5.7 g/dL — ABNORMAL LOW (ref 6.5–8.1)

## 2021-03-30 MED ORDER — SODIUM CHLORIDE 0.9% FLUSH
10.0000 mL | Freq: Once | INTRAVENOUS | Status: AC
  Administered 2021-03-30: 10:00:00 10 mL via INTRAVENOUS
  Filled 2021-03-30: qty 10

## 2021-03-30 MED ORDER — HEPARIN SOD (PORK) LOCK FLUSH 100 UNIT/ML IV SOLN
500.0000 [IU] | Freq: Once | INTRAVENOUS | Status: AC
  Administered 2021-03-30: 10:00:00 500 [IU] via INTRAVENOUS
  Filled 2021-03-30: qty 5

## 2021-03-30 NOTE — Progress Notes (Signed)
Symptom Management [place] at [facility] [phone] Fax:([phone]) [phone]  Patient Care Team: Idelle Crouch, MD as PCP - General (Internal Medicine) Minna Merritts, MD as Consulting Physician (Cardiology) Cammie Sickle, MD as Consulting Physician (Hematology and Oncology)   Name of the patient: Sarah Li  [id number]  Aug 31, 1940   Date of visit: 03/30/21  Reason for Consult:  Sarah Li is a 80 y.o. 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  Patient has T12 and right seventh rib osseous metastasis status post XRT.  She has had severe back pain and is followed by palliative care for symptom management.  Patient was scheduled for Keytruda on 03/16/21 but this was delayed due to tachycardia.  She was found to have A. fib with RVR and she was sent to her cardiologist.  Patient subsequently had a Holter monitor for 3 days.  She continues on beta-blocker.  Patient was seen in ER on 03/22/21 for shortness of breath and cancer pain.  She had a negative work-up with CTA, which did not show PE.  Lung cancer appears stable.  Patient ultimately left AMA.  She presented back to clinic on 03/23/21 for Keytruda but asked to delay treatment.  She continued to endorse shortness of breath and wheezing with sore throat and some nasal congestion.  She asked for something for wheezing and pain.  Patient was not interested in returning to the ER for further evaluation.  Patient is on chronic anticoagulation with Eliquis. She is on beta-blocker for heart rate.  Patient's daughter called on-call on 03/28/2021 to report the patient was not taking any of her medications including her cardiac meds.  Appetite was poor.  Patient complained of a metallic taste and was started on Magic mouthwash.  Today, patient reports to [facility] for follow-up on symptoms.  She feels overall improved.   However, daughter reports that patient was not eating or drinking or taking her medications.  Daughter has taken a more active role in medication management over the weekend and reports that patient is doing better now that she is taking her medications regularly.  Denies any neurologic complaints. Denies recent fevers or illnesses. Denies any easy bleeding or bruising. Reports fair appetite. Denies chest pain. Denies any nausea, vomiting, constipation, or diarrhea. Denies urinary complaints. Patient offers no further specific complaints today.  PAST MEDICAL HISTORY: Past Medical History:  Diagnosis Date   Atrial fibrillation ([place])    Breast cancer ([place]) 2002   positive   Carotid bruit    Coronary artery disease    Dental bridge present    lower left - permanent   Depression    GERD (gastroesophageal reflux disease)    Gout    Hyperlipidemia    Mitral valve regurgitation    Personal history of radiation therapy    Raynaud's disease    Wolff-Parkinson-White syndrome     PAST SURGICAL HISTORY:  Past Surgical History:  Procedure Laterality Date   ABDOMINAL HYSTERECTOMY     BREAST EXCISIONAL BIOPSY Right 1980   neg bx   BREAST EXCISIONAL BIOPSY Right 2002   breast ca radation   BREAST LUMPECTOMY     CARDIOVERSION N/A 05/10/2019   Procedure: CARDIOVERSION;  Surgeon: Corey Skains, MD;  Location: [facility] ORS;  Service: Cardiovascular;  Laterality: N/A;   CLOSED REDUCTION CLAVICLE FRACTURE     ELECTROPHYSIOLOGIC STUDY N/A 12/02/2015   Procedure: CARDIOVERSION;  Surgeon:  Dionisio David, MD;  Location: [facility] ORS;  Service: Cardiovascular;  Laterality: N/A;   IR IMAGING GUIDED PORT INSERTION  02/18/2021   PTOSIS REPAIR Bilateral 11/29/2017   Procedure: BLEPHAROPTOSIS REPAIR RESECT EX;  Surgeon: Karle Starch, MD;  Location: [place];  Service: Ophthalmology;  Laterality: Bilateral;    HEMATOLOGY/ONCOLOGY HISTORY:  Oncology History Overview Note  DIAGNOSIS:  A. BONE, T12  VERTEBRAL BODY LESION; BIOPSY:  - METASTATIC SQUAMOUS CELL CARCINOMA.  - SEE COMMENT   1. No acute findings or explanation for the patient's symptoms. No evidence of urinary tract calculus, hydronephrosis or bowel wall thickening. 2. Indeterminate sclerotic lesion in the right aspect of the T12 vertebral body. Given the patient's history, this could reflect metastatic disease (potentially treated). No other signs of metastatic breast cancer. Consider further assessment with whole-body bone scan or thoracic MRI. 3. Left renal cysts.   # s/p palliative radiation to the T12 lesion [11/04]; awaiting  RT  right upper lobe nodule-start next week [until 11/21].  # week of 11/28- Keytruda [TPS-1%]     # Melanoma of right cheek- s/p [15-20 years ago]   # right lumpectomy- Breast cancer [Dr.Crystal s/p RT; pills x 5 years- 2002]   Cancer, metastatic to bone ([place])  01/20/2021 Initial Diagnosis   Cancer, metastatic to bone ([place])   03/23/2021 -  Chemotherapy   Patient is on Treatment Plan : LUNG NSCLC flat dose Pembrolizumab Q21D     Primary cancer of right upper lobe of lung ([place])  02/19/2021 Initial Diagnosis   Primary cancer of right upper lobe of lung ([place])   02/19/2021 Cancer Staging   Staging form: Lung, AJCC 8th Edition - Clinical: Stage IVA (cT2, cN0, cM1b) - Signed by Cammie Sickle, MD on 02/19/2021     ALLERGIES:  is allergic to crestor [rosuvastatin calcium] and lipitor [atorvastatin].  MEDICATIONS:  Current Outpatient Medications  Medication Sig Dispense Refill   allopurinol (ZYLOPRIM) 300 MG tablet Take 300 mg by mouth daily.     amiodarone (PACERONE) 200 MG tablet Take 1 tablet (200 mg total) by mouth daily. 30 tablet 6   apixaban (ELIQUIS) 5 MG TABS tablet Take 1 tablet (5 mg total) by mouth 2 (two) times daily. 60 tablet 11   calcium carbonate (TUMS - DOSED IN MG ELEMENTAL CALCIUM) 500 MG chewable tablet Chew 1 tablet by mouth daily.      Carboxymethylcellul-Glycerin (CLEAR EYES FOR DRY EYES) 1-0.25 % SOLN Place 1 drop into both eyes daily as needed (Dry eyes).     dexamethasone (DECADRON) 2 MG tablet Take 1 tablet (2 mg total) by mouth 2 (two) times daily. 20 tablet 0   ezetimibe (ZETIA) 10 MG tablet Take 1 tablet (10 mg total) by mouth daily. 90 tablet 3   fluticasone (FLONASE) 50 MCG/ACT nasal spray Place 2 sprays into both nostrils daily as needed for allergies or rhinitis.     furosemide (LASIX) 20 MG tablet Take 20 mg by mouth daily as needed for fluid.      gabapentin (NEURONTIN) 100 MG capsule Take 100 mg by mouth at bedtime.     gemfibrozil (LOPID) 600 MG tablet Take 600 mg by mouth daily.      levalbuterol (XOPENEX HFA) 45 MCG/ACT inhaler Inhale 1 puff into the lungs every 8 (eight) hours as needed for wheezing. 1 each 12   lidocaine-prilocaine (EMLA) cream Apply 1 application topically as needed. Apply 1 application topically as needed. Apply to port and cover with  saran wrap 1-2 hours prior to port access 30 g 0   metaxalone (SKELAXIN) 800 MG tablet Take by mouth. (Patient not taking: Reported on 01/20/2021)     metoprolol succinate (TOPROL-XL) 50 MG 24 hr tablet Take 50 mg by mouth daily.     naloxone (NARCAN) nasal spray 4 mg/0.1 mL SPRAY 1 SPRAY INTO ONE NOSTRIL AS DIRECTED FOR OPIOID OVERDOSE (TURN PERSON ON SIDE AFTER DOSE. IF NO RESPONSE IN 2-3 MINUTES OR PERSON RESPONDS BUT RELAPSES, REPEAT USING A NEW SPRAY DEVICE AND SPRAY INTO THE OTHER NOSTRIL. CALL 911 AFTER USE.) * EMERGENCY USE ONLY * 1 each 0   ondansetron (ZOFRAN) 8 MG tablet Take 1 tablet (8 mg total) by mouth 2 (two) times daily as needed for nausea or vomiting. 30 tablet 1   oxyCODONE (OXYCONTIN) 20 mg 12 hr tablet Take 1 tablet (20 mg total) by mouth every 12 (twelve) hours. 60 tablet 0   Oxycodone HCl 10 MG TABS Take 1-2 tablets (10-20 mg total) by mouth every 4 (four) hours as needed (pain). 90 tablet 0   potassium chloride (KLOR-CON) 10 MEQ tablet  Take 1 tablet (10 mEq total) by mouth daily. 15 tablet 0   prochlorperazine (COMPAZINE) 10 MG tablet Take 1 tablet (10 mg total) by mouth every 6 (six) hours as needed for nausea or vomiting. 30 tablet 1   traZODone (DESYREL) 50 MG tablet Take 50 mg by mouth at bedtime.     venlafaxine (EFFEXOR) 75 MG tablet Take 75 mg by mouth daily.      Vitamin D, Ergocalciferol, (DRISDOL) 50000 units CAPS capsule Take 50,000 Units by mouth every 7 (seven) days. Friday     No current facility-administered medications for this visit.    VITAL SIGNS: There were no vitals taken for this visit. There were no vitals filed for this visit.  Estimated body mass index is 16.46 kg/m as calculated from the following:   Height as of 03/22/21: 5\' 2"  (1.575 m).   Weight as of 03/22/21: 90 lb (40.8 kg).  LABS: CBC:    Component Value Date/Time   WBC 7.9 03/22/2021 2218   HGB 12.3 03/22/2021 2218   HGB 11.0 (L) 10/21/2012 0339   HCT 36.9 03/22/2021 2218   HCT 33.1 (L) 10/21/2012 0339   PLT 226 03/22/2021 2218   PLT 289 10/21/2012 0339   MCV 89.1 03/22/2021 2218   MCV 94 10/21/2012 0339   NEUTROABS 10.0 (H) 03/16/2021 1009   NEUTROABS 7.8 (H) 10/21/2012 0339   LYMPHSABS 0.2 (L) 03/16/2021 1009   LYMPHSABS 0.5 (L) 10/21/2012 0339   MONOABS 0.5 03/16/2021 1009   MONOABS 0.5 10/21/2012 0339   EOSABS 0.0 03/16/2021 1009   EOSABS 0.0 10/21/2012 0339   BASOSABS 0.0 03/16/2021 1009   BASOSABS 0.0 10/21/2012 0339   Comprehensive Metabolic Panel:    Component Value Date/Time   NA 130 (L) 03/23/2021 1034   NA 141 10/21/2012 0339   K 3.6 03/23/2021 1034   K 4.7 10/21/2012 0339   CL 94 (L) 03/23/2021 1034   CL 112 (H) 10/21/2012 0339   CO2 20 (L) 03/23/2021 1034   CO2 22 10/21/2012 0339   BUN 28 (H) 03/23/2021 1034   BUN 20 (H) 10/21/2012 0339   CREATININE 1.38 (H) 03/23/2021 1034   CREATININE 0.91 10/21/2012 0339   GLUCOSE 157 (H) 03/23/2021 1034   GLUCOSE 125 (H) 10/21/2012 0339   CALCIUM 8.1 (L)  03/23/2021 1034   CALCIUM 8.3 (L) 10/21/2012 9147  AST 43 (H) 03/23/2021 1034   AST 82 (H) 10/18/2012 0855   ALT 25 03/23/2021 1034   ALT 37 10/18/2012 0855   ALKPHOS 65 03/23/2021 1034   ALKPHOS 96 10/18/2012 0855   BILITOT 0.7 03/23/2021 1034   BILITOT 0.7 10/18/2012 0855   PROT 6.4 (L) 03/23/2021 1034   PROT 7.6 10/18/2012 0855   ALBUMIN 3.2 (L) 03/23/2021 1034   ALBUMIN 3.8 10/18/2012 0855    RADIOGRAPHIC STUDIES: CT Angio Chest PE W and/or Wo Contrast  Result Date: 03/23/2021 CLINICAL DATA:  80 year old female with shortness of breath and pain for 1 day. Stage IV metastatic disease, unknown primary. EXAM: CT ANGIOGRAPHY CHEST WITH CONTRAST TECHNIQUE: Multidetector CT imaging of the chest was performed using the standard protocol during bolus administration of intravenous contrast. Multiplanar CT image reconstructions and MIPs were obtained to evaluate the vascular anatomy. CONTRAST:  69mL OMNIPAQUE IOHEXOL 350 MG/ML SOLN COMPARISON:  PET-CT 03/04/2021 and earlier. FINDINGS: Cardiovascular: Excellent contrast bolus timing in the pulmonary arterial tree. No focal filling defect identified in the pulmonary arteries to suggest acute pulmonary embolism. Little to no contrast in the aorta. Calcified aortic atherosclerosis. Borderline to mild cardiomegaly, right heart enlargement. No pericardial effusion. Left chest IJ approach Port-A-Cath. Mediastinum/Nodes: Stable mediastinum from the PET-CT last month. No definite mediastinal mass or lymphadenopathy. Lungs/Pleura: Small layering pleural effusions, trace on the left. The right pleural fluid is stable to decreased from last month. Right upper lobe hypermetabolic lung mass measuring about 23 mm long axis when including some curvilinear architectural distortion along its posterior margin is stable from last month. Severe underlying emphysema. Stable left apical lung scarring. Atelectatic changes to the major airways. No new pulmonary opacity. Upper  Abdomen: Grossly stable liver, gallbladder, spleen, pancreas, adrenal glands, kidneys, and bowel in the visible upper abdomen. Musculoskeletal: T12 pathologic compression fracture appears not significantly changed, with destruction of the right T12 posterior elements and costovertebral junction. Mild T11 superior endplate deformity is stable. Anterior right 7th rib metastasis with destruction appears stable. Left clavicle ORIF redemonstrated. No new bone metastasis identified. Review of the MIP images confirms the above findings. IMPRESSION: 1. Negative for acute pulmonary embolus. 2. Stable malignant findings in the chest from the PET-CT CT last month: - roughly 2 cm right upper lobe lung mass. - destructive right anterior 7th rib and T12 vertebral/costovertebral metastases. - small layering pleural effusions, slightly decreased on the right. 3. Underlying Emphysema (ICD10-J43.9). Aortic Atherosclerosis (ICD10-I70.0). Electronically Signed   By: Genevie Ann M.D.   On: 03/23/2021 04:47   NM PET Image Initial (PI) Skull Base To Thigh (F-18 FDG)  Result Date: 03/04/2021 CLINICAL DATA:  Subsequent treatment strategy for metastatic cancer of unknown primary. Radiation and steroid therapy. Planned Keytruda therapy. EXAM: NUCLEAR MEDICINE PET SKULL BASE TO THIGH TECHNIQUE: 5.57 mCi F-18 FDG was injected intravenously. Full-ring PET imaging was performed from the skull base to thigh after the radiotracer. CT data was obtained and used for attenuation correction and anatomic localization. Fasting blood glucose: 84 mg/dl COMPARISON:  PET-CT 01/22/2021 FINDINGS: Mediastinal blood pool activity: SUV max 1.8 Liver activity: SUV max 2.1 NECK: No hypermetabolic cervical lymph nodes are identified.There are no lesions of the pharyngeal mucosal space. Incidental CT findings: Bilateral carotid atherosclerosis. CHEST: There are no hypermetabolic mediastinal, hilar or axillary lymph nodes. Anterior right upper lobe hypermetabolic  nodularity is again noted with improvement in the degree of hypermetabolic activity compared with the previous study. For example, a 2.0 x 1.7 cm nodular component  on image 75/3 currently has an SUV max of 2.6; previously 4.6. No new pulmonary hypermetabolic activity or suspicious nodularity. A right pleural effusion has enlarged, without hypermetabolic activity. Incidental CT findings: Moderate to severe centrilobular and paraseptal emphysema with stable biapical scarring. Left IJ Port-A-Cath extends into the superior aspect of the right atrium. There is atherosclerosis of the aorta, great vessels and coronary arteries. ABDOMEN/PELVIS: There is no hypermetabolic activity within the liver, adrenal glands, spleen or pancreas. There is no hypermetabolic nodal activity. Incidental CT findings: Stable left renal lesions without hypermetabolic activity, likely cysts. Moderate stool throughout the colon. Diffuse aortic and branch vessel atherosclerosis. SKELETON: Hypermetabolic, destructive mass involving the T12 vertebral body again noted. This extends into the posterior elements, asymmetric to the right, and is associated with slightly greater pathologic fracture. SUV max is currently 6.4, previously 7.6. Slightly progressive destruction of the right 7th rib anteriorly (SUV max 3.5). No definite new lesions seen. Incidental CT findings: Previous left clavicular ORIF. Stable intramuscular lipoma posterior to the left shoulder. IMPRESSION: 1. Mildly improved hypermetabolic pulmonary nodularity within the right upper lobe, consistent with response to therapy. 2. Known metastases involving the T12 vertebral body and right 7th rib are slightly progressive, although the metabolic activity within the T12 lesion has slightly improved. No new osseous metastases identified. 3. No other evidence of metastatic disease. 4. Enlarging moderate-sized right pleural effusion. 5. Aortic Atherosclerosis (ICD10-I70.0) and Emphysema  (ICD10-J43.9). Electronically Signed   By: Richardean Sale M.D.   On: 03/04/2021 10:47   DG Chest Portable 1 View  Result Date: 03/23/2021 CLINICAL DATA:  80 year old female with shortness of breath and pain for 1 day. Stage IV metastatic disease, unknown primary. EXAM: PORTABLE CHEST 1 VIEW COMPARISON:  CT chest 0419 hours today and earlier. FINDINGS: Portable AP upright view at 0443 hours. Stable lung volumes and ventilation from the recent CTA chest. Stable cardiac size and mediastinal contours. Left chest Port-A-Cath. Right upper lobe lung mass, emphysema and small pleural effusions better demonstrated by CT. No pneumothorax. Stable visualized osseous structures. T12 pathologic compression fracture better demonstrated by CT. IMPRESSION: Emphysema with right upper lobe lung mass and small pleural effusions as demonstrated by CTA this hour. Electronically Signed   By: Genevie Ann M.D.   On: 03/23/2021 04:57   DG Chest Port 1 View  Result Date: 03/22/2021 CLINICAL DATA:  Shortness of breath EXAM: PORTABLE CHEST 1 VIEW COMPARISON:  PET CT 03/04/2021. FINDINGS: Left Port-A-Cath in place with the tip at the cavoatrial junction. Heart is normal size. Right upper lobe nodule again noted as seen on prior PET CT. No confluent airspace opacities. Small right effusion. Previously seen osseous metastatic disease by PET CT not well visualized by plain film. No visible acute bony abnormality. IMPRESSION: Right upper lobe nodule as seen on prior PET CT. Small right pleural effusion. Electronically Signed   By: Rolm Baptise M.D.   On: 03/22/2021 22:47    PERFORMANCE STATUS (ECOG) : 1 - Symptomatic but completely ambulatory  Review of Systems Unless otherwise noted, a complete review of systems is negative.  Physical Exam General: NAD Cardiovascular: Tachycardic, irregularly irregular Pulmonary: clear anterior/posterior fields Abdomen: soft, nontender, + bowel sounds GU: no suprapubic tenderness Extremities: no  edema, no joint deformities Skin: no rashes Neurological: Weakness but otherwise nonfocal  Assessment and Plan- Patient is a 80 y.o. 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  Patient has T12 and right seventh  rib osseous metastasis status post XRT.  On single agent Keytruda.  Patient presents to [facility] for symptom follow-up   AKI/hyponatremia/hypokalemia-suspect component of dehydration.  Patient off diuretics per daughter.  I offered IV fluids but patient declined.  She would like to increase oral intake and have follow-up as scheduled with Dr. Rogue Bussing next week.  Encouraged patient to call for worsening symptoms.  Lung cancer  - patient would like to hold Keytruda until next scheduled dose on 12/27.  Neoplasm related pain -pain is reportedly better as daughter is now managing pain medications.  Reportedly, patient was going days without taking medications.  Discussed use of a pillbox  Patient to follow-up as previously scheduled on 04/07/2021 or sooner if needed    Patient expressed understanding and was in agreement with this plan. She also understands that She can call clinic at any time with any questions, concerns, or complaints.   Thank you for allowing me to participate in the care of this very pleasant patient.   Time Total: 25 minutes  Visit consisted of counseling and education dealing with the complex and emotionally intense issues of symptom management in the setting of serious illness.Greater than 50%  of this time was spent counseling and coordinating care related to the above assessment and plan.  Signed by: Altha Harm, PhD, NP-C

## 2021-03-30 NOTE — Progress Notes (Signed)
Pt reporting shortness of breath this morning.States that she does not like to use the albuterol inhaler because it causes her heart to race.

## 2021-03-31 ENCOUNTER — Inpatient Hospital Stay: Payer: Medicare Other

## 2021-03-31 NOTE — Progress Notes (Signed)
Nutrition:  Called patient for scheduled telephone follow up. Patient unavailable, left mess for her to return call to Jennet Maduro, RD, LDN at [phone].

## 2021-04-01 ENCOUNTER — Other Ambulatory Visit: Payer: Self-pay | Admitting: *Deleted

## 2021-04-01 DIAGNOSIS — C3411 Malignant neoplasm of upper lobe, right bronchus or lung: Secondary | ICD-10-CM

## 2021-04-02 ENCOUNTER — Telehealth: Payer: Self-pay | Admitting: *Deleted

## 2021-04-02 NOTE — Telephone Encounter (Signed)
Call from patient complaining of shortness of breath and asking for home O2 today. I advised patient that she would need to go to ER for the shortness of breath this time of the day that our office is closing and that O2 cannot be arranged without testing her O2 levels. She again asked so you won't send oxygen out here. And I explained to her again the process and that she needs evaluation to find out if she has fluid build up in her lungs, pneumonia, or COVID. Also that she needs her O2 levels checked and if ER determines she needs O2, then they would arrange for her if she does not need to stay in hospital. She said she would have to think about this before she decides to go to ER

## 2021-04-07 ENCOUNTER — Encounter: Payer: Self-pay | Admitting: *Deleted

## 2021-04-07 ENCOUNTER — Inpatient Hospital Stay: Payer: Medicare Other

## 2021-04-07 ENCOUNTER — Other Ambulatory Visit: Payer: Self-pay

## 2021-04-07 ENCOUNTER — Inpatient Hospital Stay (HOSPITAL_BASED_OUTPATIENT_CLINIC_OR_DEPARTMENT_OTHER): Payer: Medicare Other | Admitting: Internal Medicine

## 2021-04-07 ENCOUNTER — Encounter: Payer: Self-pay | Admitting: Internal Medicine

## 2021-04-07 ENCOUNTER — Telehealth: Payer: Self-pay | Admitting: *Deleted

## 2021-04-07 DIAGNOSIS — C3411 Malignant neoplasm of upper lobe, right bronchus or lung: Secondary | ICD-10-CM

## 2021-04-07 DIAGNOSIS — Z95828 Presence of other vascular implants and grafts: Secondary | ICD-10-CM

## 2021-04-07 LAB — COMPREHENSIVE METABOLIC PANEL
ALT: 27 U/L (ref 0–44)
AST: 36 U/L (ref 15–41)
Albumin: 2.7 g/dL — ABNORMAL LOW (ref 3.5–5.0)
Alkaline Phosphatase: 119 U/L (ref 38–126)
Anion gap: 13 (ref 5–15)
BUN: 22 mg/dL (ref 8–23)
CO2: 28 mmol/L (ref 22–32)
Calcium: 7.8 mg/dL — ABNORMAL LOW (ref 8.9–10.3)
Chloride: 90 mmol/L — ABNORMAL LOW (ref 98–111)
Creatinine, Ser: 0.96 mg/dL (ref 0.44–1.00)
GFR, Estimated: 60 mL/min — ABNORMAL LOW (ref >60)
Glucose, Bld: 162 mg/dL — ABNORMAL HIGH (ref 70–99)
Potassium: 2.7 mmol/L — CL (ref 3.5–5.1)
Sodium: 131 mmol/L — ABNORMAL LOW (ref 135–145)
Total Bilirubin: 0.8 mg/dL (ref 0.3–1.2)
Total Protein: 5.7 g/dL — ABNORMAL LOW (ref 6.5–8.1)

## 2021-04-07 LAB — CBC WITH DIFFERENTIAL/PLATELET
Abs Immature Granulocytes: 0.1 10*3/uL — ABNORMAL HIGH (ref 0.00–0.07)
Basophils Absolute: 0.1 10*3/uL (ref 0.0–0.1)
Basophils Relative: 1 %
Eosinophils Absolute: 0.1 10*3/uL (ref 0.0–0.5)
Eosinophils Relative: 1 %
HCT: 40.8 % (ref 36.0–46.0)
Hemoglobin: 12.9 g/dL (ref 12.0–15.0)
Immature Granulocytes: 1 %
Lymphocytes Relative: 6 %
Lymphs Abs: 0.6 10*3/uL — ABNORMAL LOW (ref 0.7–4.0)
MCH: 28.7 pg (ref 26.0–34.0)
MCHC: 31.6 g/dL (ref 30.0–36.0)
MCV: 90.7 fL (ref 80.0–100.0)
Monocytes Absolute: 0.6 10*3/uL (ref 0.1–1.0)
Monocytes Relative: 6 %
Neutro Abs: 9.3 10*3/uL — ABNORMAL HIGH (ref 1.7–7.7)
Neutrophils Relative %: 85 %
Platelets: 259 10*3/uL (ref 150–400)
RBC: 4.5 MIL/uL (ref 3.87–5.11)
RDW: 15.9 % — ABNORMAL HIGH (ref 11.5–15.5)
WBC: 10.8 10*3/uL — ABNORMAL HIGH (ref 4.0–10.5)
nRBC: 0 % (ref 0.0–0.2)

## 2021-04-07 MED ORDER — FENTANYL 25 MCG/HR TD PT72: 1.0000 | MEDICATED_PATCH | TRANSDERMAL | 0 refills | Status: AC

## 2021-04-07 MED ORDER — LORAZEPAM 0.5 MG PO TABS: 0.5000 mg | ORAL_TABLET | Freq: Three times a day (TID) | ORAL | 0 refills | Status: AC

## 2021-04-07 MED ORDER — LEVALBUTEROL TARTRATE 45 MCG/ACT IN AERO: INHALATION_SPRAY | RESPIRATORY_TRACT | 12 refills | Status: AC

## 2021-04-07 MED ORDER — HEPARIN SOD (PORK) LOCK FLUSH 100 UNIT/ML IV SOLN
500.0000 [IU] | Freq: Once | INTRAVENOUS | Status: AC
  Administered 2021-04-07: 11:00:00 500 [IU] via INTRAVENOUS
  Filled 2021-04-07: qty 5

## 2021-04-07 MED ORDER — SODIUM CHLORIDE 0.9% FLUSH
10.0000 mL | Freq: Once | INTRAVENOUS | Status: DC
  Filled 2021-04-07: qty 10

## 2021-04-07 NOTE — Telephone Encounter (Signed)
Teressa Senter Key: [id number] - PA Case ID: [id number] - Rx #: [id number]  PA submitted via covermymeds for fentanyl patches

## 2021-04-07 NOTE — Progress Notes (Signed)
[place] OFFICE PROGRESS NOTE  Patient Care Team: Idelle Crouch, MD as PCP - General (Internal Medicine) Rockey Situ Kathlene November, MD as Consulting Physician (Cardiology) Cammie Sickle, MD as Consulting Physician (Hematology and Oncology)   Cancer Staging  Cancer, metastatic to bone [facility]) Staging form: Bone - Appendicular Skeleton, Trunk, Skull, and Facial Bones, AJCC 8th Edition - Clinical: No stage assigned - Unsigned  Primary cancer of right upper lobe of lung ([place]) Staging form: Lung, AJCC 8th Edition - Clinical: Stage IVA (cT2, cN0, cM1b) - Signed by Cammie Sickle, MD on 02/19/2021   Oncology History Overview Note  DIAGNOSIS:  A. BONE, T12 VERTEBRAL BODY LESION; BIOPSY:  - METASTATIC SQUAMOUS CELL CARCINOMA.  - SEE COMMENT   1. No acute findings or explanation for the patient's symptoms. No evidence of urinary tract calculus, hydronephrosis or bowel wall thickening. 2. Indeterminate sclerotic lesion in the right aspect of the T12 vertebral body. Given the patient's history, this could reflect metastatic disease (potentially treated). No other signs of metastatic breast cancer. Consider further assessment with whole-body bone scan or thoracic MRI. 3. Left renal cysts.   # s/p palliative radiation to the T12 lesion [11/04]; awaiting  RT  right upper lobe nodule-start next week [until 11/21].  # week of 11/28- Keytruda [TPS-1%]     # Melanoma of right cheek- s/p [15-20 years ago]   # right lumpectomy- Breast cancer [Dr.Crystal s/p RT; pills x 5 years- 2002]   Cancer, metastatic to bone ([place])  01/20/2021 Initial Diagnosis   Cancer, metastatic to bone ([place])   03/23/2021 -  Chemotherapy   Patient is on Treatment Plan : LUNG NSCLC flat dose Pembrolizumab Q21D     Primary cancer of right upper lobe of lung ([place])  02/19/2021 Initial Diagnosis   Primary cancer of right upper lobe of lung ([place])   02/19/2021 Cancer Staging   Staging  form: Lung, AJCC 8th Edition - Clinical: Stage IVA (cT2, cN0, cM1b) - Signed by Cammie Sickle, MD on 02/19/2021       HPI: Ambulating independently.  Accompanied by daughter, Mackinsey Pelland 80 y.o.  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  Patient was again evaluated by symptom management for pain control.  Patient overall feels poorly.  Has poor social support at home.  Patient's husband has dementia.  There is significant concern from the daughter that patient is not up-to-date on her medications/not taking medications as recommended.  Patient has poor appetite.  Feels poorly.  Complains of anxiety.  Concerned about the significant side effects of treatment.  Continues to have ongoing back pain.   Review of Systems  Constitutional:  Positive for malaise/fatigue. Negative for chills, diaphoresis, fever and weight loss.  HENT:  Negative for nosebleeds and sore throat.   Eyes:  Negative for double vision.  Respiratory:  Negative for cough, hemoptysis, sputum production, shortness of breath and wheezing.   Cardiovascular:  Negative for chest pain, palpitations, orthopnea and leg swelling.  Gastrointestinal:  Negative for abdominal pain, blood in stool, constipation, diarrhea, heartburn, melena, nausea and vomiting.  Genitourinary:  Negative for dysuria, frequency and urgency.  Musculoskeletal:  Positive for back pain. Negative for joint pain.  Skin: Negative.  Negative for itching and rash.  Neurological:  Negative for dizziness, tingling, focal weakness, weakness and headaches.  Endo/Heme/Allergies:  Does not  bruise/bleed easily.  Psychiatric/Behavioral:  Negative for depression. The patient is not nervous/anxious and does not have insomnia.      PAST MEDICAL HISTORY :  Past Medical History:  Diagnosis Date   Atrial fibrillation  ([place])    Breast cancer ([place]) 2002   positive   Cancer associated pain    Cancer, metastatic to bone [facility])    Carotid bruit    Coronary artery disease    Dental bridge present    lower left - permanent   Depression    GERD (gastroesophageal reflux disease)    Gout    Hyperlipidemia    Mitral valve regurgitation    Personal history of radiation therapy    Raynaud's disease    Wolff-Parkinson-White syndrome     PAST SURGICAL HISTORY :   Past Surgical History:  Procedure Laterality Date   ABDOMINAL HYSTERECTOMY     BREAST EXCISIONAL BIOPSY Right 1980   neg bx   BREAST EXCISIONAL BIOPSY Right 2002   breast ca radation   BREAST LUMPECTOMY     CARDIOVERSION N/A 05/10/2019   Procedure: CARDIOVERSION;  Surgeon: Corey Skains, MD;  Location: [facility] ORS;  Service: Cardiovascular;  Laterality: N/A;   [place] STUDY N/A 12/02/2015   Procedure: CARDIOVERSION;  Surgeon: Dionisio David, MD;  Location: [facility] ORS;  Service: Cardiovascular;  Laterality: N/A;   IR IMAGING GUIDED PORT INSERTION  02/18/2021   PTOSIS REPAIR Bilateral 11/29/2017   Procedure: BLEPHAROPTOSIS REPAIR RESECT EX;  Surgeon: Karle Starch, MD;  Location: [place];  Service: Ophthalmology;  Laterality: Bilateral;    FAMILY HISTORY :   Family History  Problem Relation Age of Onset   Heart attack Mother    Heart attack Father    Breast cancer Sister    Heart attack Sister    Breast cancer Sister    Heart attack Sister    Heart attack Brother    Heart attack Brother     SOCIAL HISTORY:   Social History   Tobacco Use   Smoking status: Former    Packs/day: 1.00    Years: 30.00    Pack years: 30.00    Types: Cigarettes    Quit date: 1991    Years since quitting: 32.0   Smokeless tobacco: Former    Quit date: 12/01/1989  Vaping Use   Vaping Use: Never used  Substance Use Topics   Alcohol use: No   Drug use: No    ALLERGIES:  is allergic to crestor  [rosuvastatin calcium] and lipitor [atorvastatin].  MEDICATIONS:  Current Outpatient Medications  Medication Sig Dispense Refill   allopurinol (ZYLOPRIM) 300 MG tablet Take 300 mg by mouth daily.     apixaban (ELIQUIS) 5 MG TABS tablet Take 1 tablet (5 mg total) by mouth 2 (two) times daily. 60 tablet 11   calcium carbonate (TUMS - DOSED IN MG ELEMENTAL CALCIUM) 500 MG chewable tablet Chew 1 tablet by mouth daily.     Carboxymethylcellul-Glycerin (CLEAR EYES FOR DRY EYES) 1-0.25 % SOLN Place 1 drop into both eyes daily as needed (Dry eyes).     fentaNYL (DURAGESIC) 25 MCG/HR Place 1 patch onto the skin every 3 (three) days. 5 patch 0   fluticasone (FLONASE) 50 MCG/ACT nasal spray Place 2 sprays into both nostrils daily as needed for allergies or rhinitis.     furosemide (LASIX) 20 MG tablet Take 20 mg by mouth daily as needed for fluid.  gabapentin (NEURONTIN) 100 MG capsule Take 100 mg by mouth at bedtime.     lidocaine-prilocaine (EMLA) cream Apply 1 application topically as needed. Apply 1 application topically as needed. Apply to port and cover with saran wrap 1-2 hours prior to port access 30 g 0   LORazepam (ATIVAN) 0.5 MG tablet Take 1 tablet (0.5 mg total) by mouth every 8 (eight) hours. For anxiety/difficulty sleeping at night. 30 tablet 0   metoprolol succinate (TOPROL-XL) 50 MG 24 hr tablet Take 50 mg by mouth daily.     naloxone (NARCAN) nasal spray 4 mg/0.1 mL SPRAY 1 SPRAY INTO ONE NOSTRIL AS DIRECTED FOR OPIOID OVERDOSE (TURN PERSON ON SIDE AFTER DOSE. IF NO RESPONSE IN 2-3 MINUTES OR PERSON RESPONDS BUT RELAPSES, REPEAT USING A NEW SPRAY DEVICE AND SPRAY INTO THE OTHER NOSTRIL. CALL 911 AFTER USE.) * EMERGENCY USE ONLY * 1 each 0   ondansetron (ZOFRAN) 8 MG tablet Take 1 tablet (8 mg total) by mouth 2 (two) times daily as needed for nausea or vomiting. 30 tablet 1   Oxycodone HCl 10 MG TABS Take 1-2 tablets (10-20 mg total) by mouth every 4 (four) hours as needed (pain). 90  tablet 0   potassium chloride (KLOR-CON) 10 MEQ tablet Take 1 tablet (10 mEq total) by mouth daily. 15 tablet 0   prochlorperazine (COMPAZINE) 10 MG tablet Take 1 tablet (10 mg total) by mouth every 6 (six) hours as needed for nausea or vomiting. 30 tablet 1   traZODone (DESYREL) 50 MG tablet Take 50 mg by mouth at bedtime.     venlafaxine (EFFEXOR) 75 MG tablet Take 75 mg by mouth daily.      Vitamin D, Ergocalciferol, (DRISDOL) 50000 units CAPS capsule Take 50,000 Units by mouth every 7 (seven) days. Friday     amiodarone (PACERONE) 200 MG tablet Take 1 tablet (200 mg total) by mouth daily. (Patient not taking: Reported on 04/07/2021) 30 tablet 6   dexamethasone (DECADRON) 2 MG tablet Take 1 tablet (2 mg total) by mouth 2 (two) times daily. (Patient not taking: Reported on 04/07/2021) 20 tablet 0   ezetimibe (ZETIA) 10 MG tablet Take 1 tablet (10 mg total) by mouth daily. 90 tablet 3   gemfibrozil (LOPID) 600 MG tablet Take 600 mg by mouth daily.  (Patient not taking: Reported on 04/07/2021)     levalbuterol (XOPENEX HFA) 45 MCG/ACT inhaler 1 puff every 4-6 hours as needed/for difficulty breathing. 1 each 12   metaxalone (SKELAXIN) 800 MG tablet Take by mouth. (Patient not taking: Reported on 01/20/2021)     No current facility-administered medications for this visit.    PHYSICAL EXAMINATION: ECOG PERFORMANCE STATUS: 1 - Symptomatic but completely ambulatory  BP 102/73    Pulse (!) 137    Temp (!) 97.1 F (36.2 C)    Resp 20   There were no vitals filed for this visit.   Physical Exam Vitals and nursing note reviewed.  HENT:     Head: Normocephalic and atraumatic.     Mouth/Throat:     Pharynx: Oropharynx is clear.  Eyes:     Extraocular Movements: Extraocular movements intact.     Pupils: Pupils are equal, round, and reactive to light.  Cardiovascular:     Rate and Rhythm: Normal rate and regular rhythm.  Pulmonary:     Comments: Decreased breath sounds bilaterally.   Abdominal:     Palpations: Abdomen is soft.  Musculoskeletal:        General:  Normal range of motion.     Cervical back: Normal range of motion.  Skin:    General: Skin is warm.  Neurological:     General: No focal deficit present.     Mental Status: She is alert and oriented to person, place, and time.  Psychiatric:        Behavior: Behavior normal.        Judgment: Judgment normal.       LABORATORY DATA:  I have reviewed the data as listed    Component Value Date/Time   NA 131 (L) 04/07/2021 1010   NA 141 10/21/2012 0339   K 2.7 (LL) 04/07/2021 1010   K 4.7 10/21/2012 0339   CL 90 (L) 04/07/2021 1010   CL 112 (H) 10/21/2012 0339   CO2 28 04/07/2021 1010   CO2 22 10/21/2012 0339   GLUCOSE 162 (H) 04/07/2021 1010   GLUCOSE 125 (H) 10/21/2012 0339   BUN 22 04/07/2021 1010   BUN 20 (H) 10/21/2012 0339   CREATININE 0.96 04/07/2021 1010   CREATININE 0.91 10/21/2012 0339   CALCIUM 7.8 (L) 04/07/2021 1010   CALCIUM 8.3 (L) 10/21/2012 0339   PROT 5.7 (L) 04/07/2021 1010   PROT 7.6 10/18/2012 0855   ALBUMIN 2.7 (L) 04/07/2021 1010   ALBUMIN 3.8 10/18/2012 0855   AST 36 04/07/2021 1010   AST 82 (H) 10/18/2012 0855   ALT 27 04/07/2021 1010   ALT 37 10/18/2012 0855   ALKPHOS 119 04/07/2021 1010   ALKPHOS 96 10/18/2012 0855   BILITOT 0.8 04/07/2021 1010   BILITOT 0.7 10/18/2012 0855   GFRNONAA 60 (L) 04/07/2021 1010   GFRNONAA >60 10/21/2012 0339   GFRAA >60 10/21/2012 0339    No results found for: SPEP, UPEP  Lab Results  Component Value Date   WBC 10.8 (H) 04/07/2021   NEUTROABS 9.3 (H) 04/07/2021   HGB 12.9 04/07/2021   HCT 40.8 04/07/2021   MCV 90.7 04/07/2021   PLT 259 04/07/2021      Chemistry      Component Value Date/Time   NA 131 (L) 04/07/2021 1010   NA 141 10/21/2012 0339   K 2.7 (LL) 04/07/2021 1010   K 4.7 10/21/2012 0339   CL 90 (L) 04/07/2021 1010   CL 112 (H) 10/21/2012 0339   CO2 28 04/07/2021 1010   CO2 22 10/21/2012 0339   BUN 22  04/07/2021 1010   BUN 20 (H) 10/21/2012 0339   CREATININE 0.96 04/07/2021 1010   CREATININE 0.91 10/21/2012 0339      Component Value Date/Time   CALCIUM 7.8 (L) 04/07/2021 1010   CALCIUM 8.3 (L) 10/21/2012 0339   ALKPHOS 119 04/07/2021 1010   ALKPHOS 96 10/18/2012 0855   AST 36 04/07/2021 1010   AST 82 (H) 10/18/2012 0855   ALT 27 04/07/2021 1010   ALT 37 10/18/2012 0855   BILITOT 0.8 04/07/2021 1010   BILITOT 0.7 10/18/2012 0855       RADIOGRAPHIC STUDIES: I have personally reviewed the radiological images as listed and agreed with the findings in the report. No results found.   ASSESSMENT & PLAN:  Primary cancer of right upper lobe of lung ([place]) #Stage IV -right upper lobe lung cancer /- Squamous cell carcinoma bone metastatic to T12- s/p Biopsy. PET OCT 2022- Right upper lobe hypermetabolic pulmonary nodule and adjacent interstitial thickening, most consistent with primary bronchogeniccarcinoma. T12 and right seventh rib osseous metastasis.  Postradiation to the lung/T12 vertebral lesion-PET scan number 23rd 2022-improved  metabolic activity post radiation; slightly increased metabolic activity at the site of ninth and seventh rib osseous metastasis.  Otherwise no evidence of any distant metastatic disease.   #Given the patient's declining performance status/worsening pain control I think is reasonable to hold off any systemic therapy.  I agree patient is high risk of complications from therapy than the benefits of any therapy.  See discussion below  # Back pain: Secondary to underlying malignancy ; DIS-continue oxycontine 20 mg BID; add 25 mcg fentanyl patch' continue 10 mg oxycodone 1-2 pills every 4-6 hours as needed.  Poorly controlled again reiterated the pain schedule/follow-up with hospice.   # anxiety: add ativan 0.5 mg q 8 hours prn/sleep.  # Given the incurable nature of the disease /poor tolerance of therapy and in general less than 6 months of life expectancy-I  introduced hospice philosophy to the patient and family.  Discussed that goal of care should be directed to symptom management rather than treating the underlying disease; and in the process help improve quality of life rather than quantity.  Discussed with hospice team would include-nurse, nurse aide, social worker and chaplain for help take care of patient with physical/emotional needs.  Hospice referral made.  #1 plan of care was discussed with the patient and her daughter in detail.  I went over the medication list with the patient's  #Severe hypokalemia potassium 2.9-potassium 20 meq once a day.   # DISPOSITION:  # hospice referral ASAP/today or tomorrow if possible.  # follow up as needed-Dr.B     Orders Placed This Encounter  Procedures   Ambulatory referral to Hospice    Referral Priority:   Routine    Referral Type:   Consultation    Referral Reason:   Specialty Services Required    Requested Specialty:   Hospice Services    Number of Visits Requested:   1   All questions were answered. The patient knows to call the clinic with any problems, questions or concerns.      Cammie Sickle, MD 04/07/2021 4:43 PM

## 2021-04-07 NOTE — Patient Instructions (Signed)
#  Start the pain patch as recommended  #Stop OxyContin;   #Continue oxycodone as needed for pain  #Can take Xopenex inhaler every 4-6 hours as needed for difficulty breathing  #Okay to stop Eliquis/amiodarone as per cardiology; continue metoprolol for heart rate.  #We will add anxiety medication to help with sleep/calm nerves.   #Will let hospice take over your medication management.

## 2021-04-07 NOTE — Progress Notes (Signed)
Nutrition  RD planning to see patient during infusion but no treatment today. Will follow-up as able  Gyneth Hubka B. Zenia Resides, [place], [place] Registered Dietitian [phone] (mobile)

## 2021-04-07 NOTE — Telephone Encounter (Signed)
PA Response - Approved

## 2021-04-07 NOTE — Assessment & Plan Note (Addendum)
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 PET OCT 2022- Right upper lobe hypermetabolic pulmonary nodule and adjacent interstitial thickening, most consistent with primary bronchogeniccarcinoma. T12 and right seventh rib osseous metastasis.  Postradiation to the lung/T12 vertebral lesion-PET scan number 23rd [id number] metabolic activity post radiation; slightly increased metabolic activity at the site of ninth and seventh rib osseous metastasis.  Otherwise no evidence of any distant metastatic disease.  #Given the patient's declining performance status/worsening pain control I think is reasonable to hold off any systemic therapy.  I agree patient is high risk of complications from therapy than the benefits of any therapy.  See discussion below  # Back pain: Secondary to underlying malignancy ; DIS-continue oxycontine 20 mg BID; add 25 mcg fentanyl patch' continue 10 mg oxycodone 1-2 pills every 4-6 hours as needed.  Poorly controlled again reiterated the pain schedule/follow-up with hospice.   # anxiety: add ativan 0.5 mg q 8 hours prn/sleep.  # Given the incurable nature of the disease /poor tolerance of therapy and in general less than 6 months of life expectancy-I introduced hospice philosophy to the patient and family.  Discussed that goal of care should be directed to symptom management rather than treating the underlying disease; and in the process help improve quality of life rather than quantity.  Discussed with hospice team would include-nurse, nurse aide, social worker and chaplain for help take care of patient with physical/emotional needs.  Hospice referral made.  #1 plan of care was discussed with the patient and her daughter in detail.  I went over the medication list with the patient's  #Severe hypokalemia potassium 2.9-potassium 20 meq once a day.   # DISPOSITION:  # hospice referral ASAP/today or tomorrow if possible.  #  follow up as needed-Dr.B  Cc;Dr.Sparks

## 2021-04-07 NOTE — Addendum Note (Signed)
Addended by: Sofie Rower A on: 04/07/2021 10:47 AM   Modules accepted: Orders

## 2021-04-07 NOTE — Progress Notes (Signed)
Patient is brought up from registration with visible signs of dyspnea.  She is accompanied by her daughter who reports that the pain and SOBr is worsened in the last week.  The pain located in back has not moved to her mid chest and upper abdomen, pain 7/10 now.

## 2021-04-08 ENCOUNTER — Telehealth: Payer: Self-pay | Admitting: *Deleted

## 2021-04-08 NOTE — Telephone Encounter (Signed)
Reviewed chart from 12/27. Pt did not rcv any [place] and was referred to hospice.

## 2021-04-09 ENCOUNTER — Other Ambulatory Visit: Payer: Self-pay | Admitting: *Deleted

## 2021-04-09 ENCOUNTER — Other Ambulatory Visit: Payer: Self-pay | Admitting: Internal Medicine

## 2021-04-09 MED ORDER — MORPHINE SULFATE (CONCENTRATE) 20 MG/ML PO SOLN: 5.0000 mg | ORAL | 0 refills | Status: AC | PRN

## 2021-04-09 NOTE — Telephone Encounter (Signed)
Hospice nurse Amy called reporting tha patient is having a lot of difficulty breathing and is asking for Roxanol prescription to be sent ASAP for this patient.

## 2021-04-12 DEATH — deceased

## 2021-04-15 ENCOUNTER — Encounter: Payer: Self-pay | Admitting: Internal Medicine

## 2021-04-15 ENCOUNTER — Ambulatory Visit: Payer: Medicare Other | Admitting: Radiation Oncology

## 2021-04-15 NOTE — Progress Notes (Signed)
Unable to reach the daughter.I left a voicemail for the patients daughter Kelly-Expressing my condolences.
